# Patient Record
Sex: Male | Born: 1955
Health system: Southern US, Community
[De-identification: ages and names within clinical notes are randomized; demographics above are authoritative.]

## PROBLEM LIST (undated history)

## (undated) DIAGNOSIS — I8390 Asymptomatic varicose veins of unspecified lower extremity: Secondary | ICD-10-CM

## (undated) DIAGNOSIS — K52832 Lymphocytic colitis: Secondary | ICD-10-CM

## (undated) DIAGNOSIS — Z720 Tobacco use: Secondary | ICD-10-CM

## (undated) DIAGNOSIS — K5790 Diverticulosis of intestine, part unspecified, without perforation or abscess without bleeding: Secondary | ICD-10-CM

## (undated) DIAGNOSIS — K219 Gastro-esophageal reflux disease without esophagitis: Secondary | ICD-10-CM

## (undated) DIAGNOSIS — K760 Fatty (change of) liver, not elsewhere classified: Secondary | ICD-10-CM

## (undated) DIAGNOSIS — N400 Enlarged prostate without lower urinary tract symptoms: Secondary | ICD-10-CM

## (undated) DIAGNOSIS — Z8719 Personal history of other diseases of the digestive system: Secondary | ICD-10-CM

## (undated) DIAGNOSIS — M2140 Flat foot [pes planus] (acquired), unspecified foot: Secondary | ICD-10-CM

## (undated) DIAGNOSIS — I10 Essential (primary) hypertension: Secondary | ICD-10-CM

## (undated) DIAGNOSIS — M459 Ankylosing spondylitis of unspecified sites in spine: Secondary | ICD-10-CM

## (undated) DIAGNOSIS — K4090 Unilateral inguinal hernia, without obstruction or gangrene, not specified as recurrent: Secondary | ICD-10-CM

## (undated) HISTORY — DX: Ankylosing spondylitis of unspecified sites in spine: M45.9

## (undated) HISTORY — DX: Fatty (change of) liver, not elsewhere classified: K76.0

## (undated) HISTORY — DX: Tobacco use: Z72.0

## (undated) HISTORY — DX: Flat foot (pes planus) (acquired), unspecified foot: M21.40

## (undated) HISTORY — DX: Asymptomatic varicose veins of unspecified lower extremity: I83.90

## (undated) HISTORY — DX: Benign prostatic hyperplasia without lower urinary tract symptoms: N40.0

## (undated) HISTORY — DX: Diverticulosis of intestine, part unspecified, without perforation or abscess without bleeding: K57.90

## (undated) HISTORY — PX: OTHER SURGICAL HISTORY: SHX169

## (undated) HISTORY — DX: Lymphocytic colitis: K52.832

---

## 1992-10-02 HISTORY — PX: INGUINAL HERNIA REPAIR: SUR1180

## 2003-10-03 HISTORY — PX: OTHER SURGICAL HISTORY: SHX169

## 2006-10-02 LAB — HM COLONOSCOPY: HM Colonoscopy: NORMAL

## 2006-12-05 ENCOUNTER — Ambulatory Visit: Payer: Self-pay | Admitting: Family Medicine

## 2006-12-05 DIAGNOSIS — I868 Varicose veins of other specified sites: Secondary | ICD-10-CM | POA: Insufficient documentation

## 2006-12-05 DIAGNOSIS — M79609 Pain in unspecified limb: Secondary | ICD-10-CM | POA: Insufficient documentation

## 2006-12-05 DIAGNOSIS — M459 Ankylosing spondylitis of unspecified sites in spine: Secondary | ICD-10-CM | POA: Insufficient documentation

## 2006-12-05 DIAGNOSIS — I10 Essential (primary) hypertension: Secondary | ICD-10-CM | POA: Insufficient documentation

## 2006-12-13 ENCOUNTER — Ambulatory Visit: Payer: Self-pay | Admitting: Family Medicine

## 2006-12-13 ENCOUNTER — Telehealth (INDEPENDENT_AMBULATORY_CARE_PROVIDER_SITE_OTHER): Payer: Self-pay | Admitting: *Deleted

## 2006-12-13 DIAGNOSIS — K625 Hemorrhage of anus and rectum: Secondary | ICD-10-CM | POA: Insufficient documentation

## 2006-12-17 ENCOUNTER — Telehealth (INDEPENDENT_AMBULATORY_CARE_PROVIDER_SITE_OTHER): Payer: Self-pay | Admitting: *Deleted

## 2006-12-17 ENCOUNTER — Encounter: Payer: Self-pay | Admitting: Family Medicine

## 2007-01-14 ENCOUNTER — Encounter: Payer: Self-pay | Admitting: Family Medicine

## 2007-01-14 LAB — CONVERTED CEMR LAB
AST: 23 units/L (ref 0–37)
CO2: 24 meq/L (ref 19–32)
Chloride: 106 meq/L (ref 96–112)
Cholesterol: 214 mg/dL — ABNORMAL HIGH (ref 0–200)
Creatinine, Ser: 0.97 mg/dL (ref 0.40–1.50)
Glucose, Bld: 87 mg/dL (ref 70–99)
LDL Cholesterol: 143 mg/dL — ABNORMAL HIGH (ref 0–99)
PSA: 2.89 ng/mL (ref 0.10–4.00)
Total Bilirubin: 0.6 mg/dL (ref 0.3–1.2)
Total CHOL/HDL Ratio: 5.1
VLDL: 29 mg/dL (ref 0–40)

## 2007-01-16 ENCOUNTER — Ambulatory Visit: Payer: Self-pay | Admitting: Family Medicine

## 2007-01-16 DIAGNOSIS — E785 Hyperlipidemia, unspecified: Secondary | ICD-10-CM | POA: Insufficient documentation

## 2007-02-13 ENCOUNTER — Ambulatory Visit: Payer: Self-pay | Admitting: Family Medicine

## 2007-02-13 DIAGNOSIS — N400 Enlarged prostate without lower urinary tract symptoms: Secondary | ICD-10-CM | POA: Insufficient documentation

## 2007-02-19 ENCOUNTER — Telehealth (INDEPENDENT_AMBULATORY_CARE_PROVIDER_SITE_OTHER): Payer: Self-pay | Admitting: *Deleted

## 2007-02-20 ENCOUNTER — Telehealth: Payer: Self-pay | Admitting: Family Medicine

## 2007-02-27 ENCOUNTER — Encounter: Payer: Self-pay | Admitting: Family Medicine

## 2007-03-01 ENCOUNTER — Ambulatory Visit: Payer: Self-pay | Admitting: Internal Medicine

## 2007-03-05 ENCOUNTER — Telehealth: Payer: Self-pay | Admitting: Family Medicine

## 2007-03-11 ENCOUNTER — Ambulatory Visit: Payer: Self-pay | Admitting: Family Medicine

## 2007-03-11 DIAGNOSIS — M5030 Other cervical disc degeneration, unspecified cervical region: Secondary | ICD-10-CM | POA: Insufficient documentation

## 2007-03-14 ENCOUNTER — Encounter: Payer: Self-pay | Admitting: Family Medicine

## 2007-03-25 ENCOUNTER — Ambulatory Visit: Payer: Self-pay | Admitting: Internal Medicine

## 2007-03-25 ENCOUNTER — Encounter: Payer: Self-pay | Admitting: Family Medicine

## 2007-03-26 ENCOUNTER — Telehealth: Payer: Self-pay | Admitting: Family Medicine

## 2007-04-23 ENCOUNTER — Telehealth: Payer: Self-pay | Admitting: Family Medicine

## 2007-04-25 ENCOUNTER — Ambulatory Visit: Payer: Self-pay | Admitting: Family Medicine

## 2007-04-25 LAB — CONVERTED CEMR LAB
Albumin: 3.9 g/dL (ref 3.5–5.2)
Alkaline Phosphatase: 83 units/L (ref 39–117)
Cholesterol: 182 mg/dL (ref 0–200)
HDL: 37 mg/dL — ABNORMAL LOW (ref >39)
Indirect Bilirubin: 0.3 mg/dL (ref 0.0–0.9)
LDL Cholesterol: 122 mg/dL — ABNORMAL HIGH (ref 0–99)
Total CHOL/HDL Ratio: 4.9

## 2007-04-26 ENCOUNTER — Encounter: Payer: Self-pay | Admitting: Family Medicine

## 2007-05-07 ENCOUNTER — Ambulatory Visit: Payer: Self-pay | Admitting: Family Medicine

## 2007-05-07 ENCOUNTER — Encounter: Admission: RE | Admit: 2007-05-07 | Discharge: 2007-05-07 | Payer: Self-pay | Admitting: Family Medicine

## 2007-05-07 DIAGNOSIS — D485 Neoplasm of uncertain behavior of skin: Secondary | ICD-10-CM | POA: Insufficient documentation

## 2007-05-08 ENCOUNTER — Telehealth (INDEPENDENT_AMBULATORY_CARE_PROVIDER_SITE_OTHER): Payer: Self-pay | Admitting: *Deleted

## 2007-05-09 ENCOUNTER — Ambulatory Visit: Admission: RE | Admit: 2007-05-09 | Discharge: 2007-05-09 | Payer: Self-pay | Admitting: Family Medicine

## 2007-05-09 ENCOUNTER — Telehealth: Payer: Self-pay | Admitting: Family Medicine

## 2007-05-09 ENCOUNTER — Ambulatory Visit: Payer: Self-pay | Admitting: Vascular Surgery

## 2007-05-30 ENCOUNTER — Ambulatory Visit (HOSPITAL_COMMUNITY)
Admission: RE | Admit: 2007-05-30 | Discharge: 2007-05-30 | Payer: Self-pay | Admitting: Physical Medicine and Rehabilitation

## 2007-06-05 ENCOUNTER — Encounter: Payer: Self-pay | Admitting: Family Medicine

## 2007-06-17 ENCOUNTER — Encounter
Admission: RE | Admit: 2007-06-17 | Discharge: 2007-06-17 | Payer: Self-pay | Admitting: Physical Medicine and Rehabilitation

## 2007-07-04 ENCOUNTER — Encounter: Payer: Self-pay | Admitting: Family Medicine

## 2007-07-05 ENCOUNTER — Encounter: Payer: Self-pay | Admitting: Family Medicine

## 2007-07-05 LAB — CONVERTED CEMR LAB: AST: 18 units/L (ref 0–37)

## 2007-07-08 ENCOUNTER — Telehealth (INDEPENDENT_AMBULATORY_CARE_PROVIDER_SITE_OTHER): Payer: Self-pay | Admitting: *Deleted

## 2007-07-08 ENCOUNTER — Ambulatory Visit: Payer: Self-pay | Admitting: Family Medicine

## 2007-07-08 DIAGNOSIS — R945 Abnormal results of liver function studies: Secondary | ICD-10-CM | POA: Insufficient documentation

## 2007-07-08 DIAGNOSIS — M5137 Other intervertebral disc degeneration, lumbosacral region: Secondary | ICD-10-CM | POA: Insufficient documentation

## 2007-11-01 ENCOUNTER — Encounter
Admission: RE | Admit: 2007-11-01 | Discharge: 2007-11-01 | Payer: Self-pay | Admitting: Physical Medicine and Rehabilitation

## 2007-12-01 HISTORY — PX: BACK SURGERY: SHX140

## 2007-12-05 ENCOUNTER — Inpatient Hospital Stay (HOSPITAL_COMMUNITY): Admission: RE | Admit: 2007-12-05 | Discharge: 2007-12-06 | Payer: Self-pay | Admitting: Neurosurgery

## 2008-11-03 ENCOUNTER — Ambulatory Visit: Payer: Self-pay | Admitting: Family Medicine

## 2008-11-03 LAB — CONVERTED CEMR LAB
AST: 19 units/L (ref 0–37)
Albumin: 4.3 g/dL (ref 3.5–5.2)
Alkaline Phosphatase: 71 units/L (ref 39–117)
CO2: 25 meq/L (ref 19–32)
Chloride: 105 meq/L (ref 96–112)
Cholesterol: 167 mg/dL (ref 0–200)
Glucose, Bld: 99 mg/dL (ref 70–99)
Sodium: 143 meq/L (ref 135–145)
TSH: 1.627 microintl units/mL (ref 0.350–4.50)
Total Protein: 6.8 g/dL (ref 6.0–8.3)
Triglycerides: 80 mg/dL (ref <150)
VLDL: 16 mg/dL (ref 0–40)

## 2008-11-04 ENCOUNTER — Encounter: Payer: Self-pay | Admitting: Family Medicine

## 2009-05-20 ENCOUNTER — Telehealth (INDEPENDENT_AMBULATORY_CARE_PROVIDER_SITE_OTHER): Payer: Self-pay | Admitting: *Deleted

## 2009-06-03 ENCOUNTER — Telehealth (INDEPENDENT_AMBULATORY_CARE_PROVIDER_SITE_OTHER): Payer: Self-pay | Admitting: *Deleted

## 2009-06-15 ENCOUNTER — Telehealth (INDEPENDENT_AMBULATORY_CARE_PROVIDER_SITE_OTHER): Payer: Self-pay | Admitting: *Deleted

## 2009-08-13 ENCOUNTER — Ambulatory Visit: Payer: Self-pay | Admitting: Family Medicine

## 2009-08-13 DIAGNOSIS — K5730 Diverticulosis of large intestine without perforation or abscess without bleeding: Secondary | ICD-10-CM | POA: Insufficient documentation

## 2009-10-05 ENCOUNTER — Ambulatory Visit: Payer: Self-pay | Admitting: Family Medicine

## 2009-10-05 DIAGNOSIS — K5909 Other constipation: Secondary | ICD-10-CM | POA: Insufficient documentation

## 2009-10-07 ENCOUNTER — Telehealth: Payer: Self-pay | Admitting: Family Medicine

## 2009-10-07 ENCOUNTER — Encounter: Admission: RE | Admit: 2009-10-07 | Discharge: 2009-10-07 | Payer: Self-pay | Admitting: Family Medicine

## 2009-10-07 DIAGNOSIS — R1084 Generalized abdominal pain: Secondary | ICD-10-CM | POA: Insufficient documentation

## 2009-10-08 ENCOUNTER — Ambulatory Visit: Payer: Self-pay | Admitting: Family Medicine

## 2009-10-08 DIAGNOSIS — K760 Fatty (change of) liver, not elsewhere classified: Secondary | ICD-10-CM | POA: Insufficient documentation

## 2010-10-02 HISTORY — PX: BACK SURGERY: SHX140

## 2010-10-25 ENCOUNTER — Ambulatory Visit
Admission: RE | Admit: 2010-10-25 | Discharge: 2010-10-25 | Payer: Self-pay | Source: Home / Self Care | Attending: Family Medicine | Admitting: Family Medicine

## 2010-10-25 DIAGNOSIS — J069 Acute upper respiratory infection, unspecified: Secondary | ICD-10-CM | POA: Insufficient documentation

## 2010-10-25 DIAGNOSIS — K5900 Constipation, unspecified: Secondary | ICD-10-CM | POA: Insufficient documentation

## 2010-11-01 NOTE — Progress Notes (Signed)
Summary: Still constipated  Phone Note Call from Patient   Caller: Patient [phone] Summary of Call: Pt called this AM stating that he does not feel any better. States he is taking all the recommended meds but still constipated. Please advise.  Initial call taken by: Payton Spark CMA,  October 07, 2009 8:07 AM  Follow-up for Phone Call        lets go ahead and get a flat and upright abdominal xray to r/o obstuction and add RX Lactulose for constipation. Follow-up by: Seymour Bars DO,  October 07, 2009 8:21 AM    New/Updated Medications: LACTULOSE 10 GM/15ML SOLN (LACTULOSE) 30 ml by mouth two times a day x 3-5 days for constipation Prescriptions: LACTULOSE 10 GM/15ML SOLN (LACTULOSE) 30 ml by mouth two times a day x 3-5 days for constipation  #300 ml x 0   Entered and Authorized by:   Seymour Bars DO   Signed by:   Seymour Bars DO on 10/07/2009   Method used:   [facility]  [address] [phone][facility]       [address] [place], [place]  [address]       Ph: [phone] or [phone]       Fax: [phone]   RxID:   [id number]   Appended Document: Still constipated Pt aware

## 2010-11-01 NOTE — Assessment & Plan Note (Signed)
Summary: f/u constipation   Vital Signs:  Patient profile:   55 year old male Height:      73 inches Weight:      202 pounds BMI:     26.75 O2 Sat:      99 % on Room air Pulse rate:   87 / minute BP sitting:   127 / 87  (left arm) Cuff size:   large  Vitals Entered By: Payton Spark CMA (October 08, 2009 9:51 AM)  O2 Flow:  Room air CC: F/U US. Pain is gone. Had 3 BMs this AM. Feeling much better.   Primary Care Provider:  Seymour Bars D.O.  CC:  F/U US. Pain is gone. Had 3 BMs this AM. Feeling much better.Marland Kitchen  History of Present Illness: 55 yo WM presents for f/u constipation.  He used 2 enemas, benefiber and miralax with stool softeners for 3 days.  He did not have  a stool until this morning.  He had 3 large stools this morning and his abdominal pain and distension has improved.  the constipation was felt to be related to anti histamines.  Had a colonoscopy in 08.  He is not having blood in the stool.  His CT abd/ pelvis was normal other than fatty liver. He plans to start working out regularly.  He consumes 4-6 ETOH drinks/ wk and does not take any tylenol.     Allergies (verified): No Known Drug Allergies  Past History:  Past Medical History: ankylosing spondylitis varicose veins pes planus tobacco abuse BPH colonoscopy 08, normal diverticulosis fatty liver dz  Review of Systems      See HPI  Physical Exam  General:  alert, well-developed, well-nourished, and well-hydrated.   Head:  normocephalic and atraumatic.   Eyes:  sclera non icteric Mouth:  pharynx pink and moist.   Neck:  large SK at the base of the L side of neck Lungs:  Normal respiratory effort, chest expands symmetrically. Lungs are clear to auscultation, no crackles or wheezes. Heart:  Normal rate and regular rhythm. S1 and S2 normal without gallop, murmur, click, rub or other extra sounds. Abdomen:  Bowel sounds positive,abdomen soft and non-tender without masses, organomegaly.  softer and  now nontender. Skin:  color normal.   Psych:  good eye contact, not anxious appearing, and not depressed appearing.     Impression & Recommendations:  Problem # 1:  CONSTIPATION, DRUG INDUCED (ICD-564.09) Secondary to cold medications/ lack of water. Symptomatically much improved after bowel regimen.  CT abd/ pelvis other than fatty liver dz is normal.  Colonoscopy from 08 reviewed, normal other than diverticulosis. We discussed use of daily high fiber diet (with benefiber), increased water intake and stool softeners as needed.   His updated medication list for this problem includes:    Lactulose 10 Gm/108ml Soln (Lactulose) .Marland KitchenMarland KitchenMarland KitchenMarland Kitchen 30 ml by mouth two times a day x 3-5 days for constipation  Problem # 2:  FATTY LIVER DISEASE (ICD-571.8) Assessment: New Seen incidentally on CT of the abdomen and pelvis. We discussed treatment with healthy diet, exercise, wt loss and limiting ETOH and Tyelnol containing products.  Complete Medication List: 1)  Aspir-low 81 Mg Tbec (Aspirin) .... Take 1 tablet by mouth two times a day 2)  Caltrate 600+d 600-400 Mg-unit Tabs (Calcium carbonate-vitamin d) .... Take 1 tablet by mouth once a day 3)  Multivitamins Tabs (Multiple vitamin) .... 1/2 by mouth two times a day 4)  Vitamin E 1000 Unit Caps (Vitamin e) .Marland KitchenMarland KitchenMarland Kitchen  Take 1 tablet by mouth once a day 5)  Potassium 99 Mg Tabs (Potassium) .... Take two tabs by mouth daily 6)  Pravastatin Sodium 40 Mg Tabs (Pravastatin sodium) .Marland Kitchen.. 1 tab by mouth qhs 7)  Flomax 0.4 Mg Cp24 (Tamsulosin hcl) .Marland Kitchen.. 1 tab by mouth qpm 8)  Fish Oil 1000 Mg Caps (Omega-3 fatty acids) .... 2 capsules by mouth bid 9)  B-12 1000 Mcg Cr-tabs (Cyanocobalamin) .... Take 1 tablet by mouth once a day 10)  Chantix Starting Month Pak 0.5 Mg X 11 & 1 Mg X 42 Tabs (Varenicline tartrate) .... Take as directed 11)  Lactulose 10 Gm/21ml Soln (Lactulose) .... 30 ml by mouth two times a day x 3-5 days for constipation  Other Orders: Admin 1st Vaccine  (78295) Flu Vaccine 60yrs + (62130)  Patient Instructions: 1)  Work on high fiber diet-- you can stay on Benefiber daily with V8 Fusion.  Drink 6 glasses of water each day.  Use Stool softener as needed. 2)  Return in 2 months for a CPE / fasting labs. Flu Vaccine Consent Questions     Do you have a history of severe allergic reactions to this vaccine? no    Any prior history of allergic reactions to egg and/or gelatin? no    Do you have a sensitivity to the preservative Thimersol? no    Do you have a past history of Guillan-Barre Syndrome? no    Do you currently have an acute febrile illness? no    Have you ever had a severe reaction to latex? no    Vaccine information given and explained to patient? yes    Are you currently pregnant? no    Lot Number:[id number]   Exp Date:03/31/2010   Site Given  Left Deltoid IMUse Stool softener as needed. 2)  Return in 2 months for a CPE / fasting labs.     Marland Kitchenlbflu

## 2010-11-01 NOTE — Letter (Signed)
Summary: Out of Work  [facility]  [address], [address]   [place], [place] [address]   Phone: [phone]  Fax: [phone]    October 05, 2009   Employee:  [employer] BREIGHNER    To Whom It May Concern:   For Medical reasons, please excuse the above named employee from work for the following dates:  Start:   Jan 4th  End:   Jan 5th  If you need additional information, please feel free to contact our office.         Sincerely,    Seymour Bars DO

## 2010-11-01 NOTE — Assessment & Plan Note (Signed)
Summary: constipation   Vital Signs:  Patient profile:   55 year old male Height:      73 inches Weight:      203 pounds BMI:     26.88 O2 Sat:      98 % on Room air Temp:     97.9 degrees F oral Pulse rate:   85 / minute BP sitting:   131 / 87  (left arm) Cuff size:   large  Vitals Entered By: Payton Spark CMA (October 05, 2009 9:04 AM)  O2 Flow:  Room air CC: Constipation, stomach cramps, indigestion and weak x 1 day.    Primary Care Provider:  Seymour Bars D.O.  CC:  Constipation, stomach cramps, and indigestion and weak x 1 day. Marland Kitchen  History of Present Illness: Raymond Mitchell presents for stomach cramps that started yesterday.  He started to get a cold last wk, taking OTC meds.  Had constipation last wk after taking cold meds.  He is taking a stool softener.  Felt weak yesterday.  Had gas pain last night.  Took Mag citrate last night and went a little at 5:30 Am today.  He has had to strain.  He has tried to drink more water.  He had a colonoscpy at age 59 that was normal.  No blood in the stool.  Some waves of nausea, no vomitting.    Current Medications (verified): 1)  Aspir-Low 81 Mg Tbec (Aspirin) .... Take 1 Tablet By Mouth Two Times A Day 2)  Caltrate 600+d 600-400 Mg-Unit Tabs (Calcium Carbonate-Vitamin D) .... Take 1 Tablet By Mouth Once A Day 3)  Multivitamins  Tabs (Multiple Vitamin) .... 1/2 By Mouth Two Times A Day 4)  Vitamin E 1000 Unit Caps (Vitamin E) .... Take 1 Tablet By Mouth Once A Day 5)  Potassium 99 Mg Tabs (Potassium) .... Take Two Tabs By Mouth Daily 6)  Pravastatin Sodium 40 Mg Tabs (Pravastatin Sodium) .Marland Kitchen.. 1 Tab By Mouth Qhs 7)  Flomax 0.4 Mg Cp24 (Tamsulosin Hcl) .Marland Kitchen.. 1 Tab By Mouth Qpm 8)  Fish Oil 1000 Mg  Caps (Omega-3 Fatty Acids) .... 2 Capsules By Mouth Bid 9)  B-12 1000 Mcg Cr-Tabs (Cyanocobalamin) .... Take 1 Tablet By Mouth Once A Day  Allergies (verified): No Known Drug Allergies  Past History:  Past Medical History: ankylosing  spondylitis varicose veins pes planus tobacco abuse BPH colonoscopy 08, normal  Social History: Reviewed history from 12/05/2006 and no changes required. 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   Head:  normocephalic and atraumatic.   Mouth:  good dentition and pharynx pink and moist.   Neck:  no masses.   Lungs:  Normal respiratory effort, chest expands symmetrically. Lungs are clear to auscultation, no crackles or wheezes. Heart:  Normal rate and regular rhythm. S1 and S2 normal without gallop, murmur, click, rub or other extra sounds. Abdomen:  soft, ND, soft with hyperactive BS, tympanic to percussion.  Periumbilical guarding.   Extremities:  no LE edema Skin:  color normal.   Cervical Nodes:  No lymphadenopathy noted Psych:  good eye contact, not anxious appearing, and not depressed appearing.  Impression & Recommendations:  Problem # 1:  CONSTIPATION, DRUG INDUCED (ICD-564.09) Cold medication induced constipation x 1 wk and benign abdominal exam findings. Treat with Miralax 2 x daily x 3 days then once daily x 3 days. Stool softener 2 x a day. Fleets enema x 2 today. Benefiber samples given to mix with 12 oz of prune juice daily.  64 oz water daily. Out of work today due to severe cramps. If abd pain/ constipation not improved in 48 hrs, will have him call me for imaging.  Complete Medication List: 1)  Aspir-low 81 Mg Tbec (Aspirin) .... Take 1 tablet by mouth two times a day 2)  Caltrate 600+d 600-400 Mg-unit Tabs (Calcium carbonate-vitamin d) .... Take 1 tablet by mouth once a day 3)  Multivitamins Tabs (Multiple vitamin) .... 1/2 by mouth  two times a day 4)  Vitamin E 1000 Unit Caps (Vitamin e) .... Take 1 tablet by mouth once a day 5)  Potassium 99 Mg Tabs (Potassium) .... Take two tabs by mouth daily 6)  Pravastatin Sodium 40 Mg Tabs (Pravastatin sodium) .Marland Kitchen.. 1 tab by mouth qhs 7)  Flomax 0.4 Mg Cp24 (Tamsulosin hcl) .Marland Kitchen.. 1 tab by mouth qpm 8)  Fish Oil 1000 Mg Caps (Omega-3 fatty acids) .... 2 capsules by mouth bid 9)  B-12 1000 Mcg Cr-tabs (Cyanocobalamin) .... Take 1 tablet by mouth once a day 10)  Chantix Starting Month Pak 0.5 Mg X 11 & 1 Mg X 42 Tabs (Varenicline tartrate) .... Take as directed  Patient Instructions: 1)  Take OTC Miralax 2 x a day for the next 3 days then once daily for 3 more days.  Drink 64 oz/ water daily.  Try adding Prune juice or V8 fusion daily.  Mix with one packet of benefiber daily. 2)  Avoid cold medications. 3)  Take you stool softener 2 x a day. 4)  Use one fleets enema now, repeat this evening if no results. 5)  If constipation/ abdominal pain not improved in 2 days, please call. Prescriptions: CHANTIX STARTING MONTH PAK 0.5 MG X 11 & 1 MG X 42 TABS (VARENICLINE TARTRATE) take as directed  #1 pack x 0   Entered and Authorized by:   Seymour Bars DO   Signed by:   Seymour Bars DO on 10/05/2009   Method used:   [facility]        [facility]  [address] [phone][facility] (retail[facility]       [address] [place], [place]  [address]       Ph: [phone] or [phone]       Fax: [phone]   RxID:   [id number]

## 2010-11-03 NOTE — Assessment & Plan Note (Signed)
Summary: URI   Vital Signs:  Patient profile:   55 year old male Height:      73 inches Weight:      193 pounds BMI:     25.56 O2 Sat:      97 % on Room air Temp:     98.4 degrees F oral Pulse rate:   68 / minute BP sitting:   124 / 84  (left arm) Cuff size:   large  Vitals Entered By: Payton Spark CMA (October 25, 2010 11:32 AM)  O2 Flow:  Room air CC: Congestion, drainage, ST, and cough x 3 days.    Primary Care Provider:  Seymour Bars D.O.  CC:  Congestion, drainage, ST, and and cough x 3 days. Marland Kitchen  History of Present Illness: 55 yo WM presents for sore throat, runny nose and congestion x 3 days with a slight cough.  He is taking some [facility] cold medicine- Coricidan which is not helping much.  He is a smoker.  Denies chest pain or SOB.  Clear / yellow rhinorrea.  No fevers.  Some chills.  No HAs or GI upset.  He did not get a flu shot this year.  Current Medications (verified): 1)  Aspir-Low 81 Mg Tbec (Aspirin) .... Take 1 Tablet By Mouth Two Times A Day 2)  Caltrate 600+d 600-400 Mg-Unit Tabs (Calcium Carbonate-Vitamin D) .... Take 1 Tablet By Mouth Once A Day 3)  Multivitamins  Tabs (Multiple Vitamin) .... 1/2 By Mouth Two Times A Day 4)  Vitamin E 1000 Unit Caps (Vitamin E) .... Take 1 Tablet By Mouth Once A Day 5)  Potassium 99 Mg Tabs (Potassium) .... Take Two Tabs By Mouth Daily 6)  Pravastatin Sodium 40 Mg Tabs (Pravastatin Sodium) .Marland Kitchen.. 1 Tab By Mouth Qhs 7)  Flomax 0.4 Mg Caps (Tamsulosin Hcl) .... Take 1 Cap By Mouth Once Daily 8)  Fish Oil 1000 Mg  Caps (Omega-3 Fatty Acids) .... 2 Capsules By Mouth Bid 9)  B-12 1000 Mcg Cr-Tabs (Cyanocobalamin) .... Take 1 Tablet By Mouth Once A Day  Allergies (verified): No Known Drug Allergies  Past History:  Past Medical History: Reviewed history from 10/08/2009 and no changes required. ankylosing spondylitis varicose veins pes planus tobacco abuse BPH colonoscopy 08, normal diverticulosis fatty liver dz  Past  Surgical History: Reviewed history from 12/05/2006 and no changes required. inguinal hernia 1994 RTC surgery 2005 C spine ESI by Dr Penelope Galas  Social History: Reviewed history from 12/05/2006 and no changes required. 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 ENT:  Complains of nasal congestion, postnasal drainage, and sore throat; denies sinus pressure. CV:  Denies chest pain or discomfort and shortness of breath with exertion. Resp:  Complains of cough; denies chest discomfort, chest pain with inspiration, shortness of breath, sputum productive, and wheezing. GI:  Complains of constipation; denies abdominal pain, diarrhea, nausea, and vomiting.  Physical Exam  General:  alert, well-developed, well-nourished, and well-hydrated.   Head:  normocephalic and atraumatic.  sinuses NTTP Eyes:  conjunctiva clear Nose:  nasal congestion with clear rhinorrhea present Mouth:  pharynx pink and moist.  o/p slightly injected Neck:  shoddy anterior cervical chain LA  present Lungs:  Normal respiratory effort, chest expands symmetrically. Lungs are clear to auscultation, no crackles or wheezes. dry cough Heart:  Normal rate and regular rhythm. S1 and S2 normal without gallop, murmur, click, rub or other extra sounds. Abdomen:  slightly distended with hard stool palapable in the prox R colon Skin:  warm and dry; multiple lg SKs on back   Impression & Recommendations:  Problem # 1:  VIRAL URI (ICD-465.9) Supportive care for viral URI, day 3.  Avoid smoking.  Change OTC medicine to Mucinex DM in the AM and Hycodan at night.  Call if any problems.   His updated medication list for  this problem includes:    Aspir-low 81 Mg Tbec (Aspirin) .Marland Kitchen... Take 1 tablet by mouth two times a day    Hydrocodone-homatropine 5-1.5 Mg/46ml Syrp (Hydrocodone-homatropine) .Marland KitchenMarland KitchenMarland KitchenMarland Kitchen 5 ml by mouth at bedtime as needed cough  Instructed on symptomatic treatment. Call if symptoms persist or worsen.   Problem # 2:  CONSTIPATION (ICD-564.00) Chronic.  Can use OTC Miralax + high fiber diet. The following medications were removed from the medication list:    Lactulose 10 Gm/82ml Soln (Lactulose) .Marland KitchenMarland KitchenMarland KitchenMarland Kitchen 30 ml by mouth two times a day x 3-5 days for constipation  Complete Medication List: 1)  Aspir-low 81 Mg Tbec (Aspirin) .... Take 1 tablet by mouth two times a day 2)  Caltrate 600+d 600-400 Mg-unit Tabs (Calcium carbonate-vitamin d) .... Take 1 tablet by mouth once a day 3)  Multivitamins Tabs (Multiple vitamin) .... 1/2 by mouth two times a day 4)  Vitamin E 1000 Unit Caps (Vitamin e) .... Take 1 tablet by mouth once a day 5)  Potassium 99 Mg Tabs (Potassium) .... Take two tabs by mouth daily 6)  Pravastatin Sodium 40 Mg Tabs (Pravastatin sodium) .Marland Kitchen.. 1 tab by mouth qhs 7)  Flomax 0.4 Mg Caps (Tamsulosin hcl) .... Take 1 cap by mouth once daily 8)  Fish Oil 1000 Mg Caps (Omega-3 fatty acids) .... 2 capsules by mouth bid 9)  B-12 1000 Mcg Cr-tabs (Cyanocobalamin) .... Take 1 tablet by mouth once a day 10)  Hydrocodone-homatropine 5-1.5 Mg/63ml Syrp (Hydrocodone-homatropine) .... 5 ml by mouth at bedtime as needed cough  Patient Instructions: 1)  For cold symptoms:  Take Mucinex DM in the morning and RX cough syrup at night.  Use Ibuprofen as needed aches/ pains. 2)  REst, clear fluids and avoid smoking. 3)  Use OTC Miralax either everyday or every other day for chronic constipation. 4)  schedule PHYSICAL with fasting labs in 3-4 wks. Prescriptions: HYDROCODONE-HOMATROPINE 5-1.5 MG/5ML SYRP (HYDROCODONE-HOMATROPINE) 5 ml by mouth at bedtime as needed cough  #100 ml x 0   Entered and Authorized by:    Seymour Bars DO   Signed by:   Seymour Bars DO on 10/25/2010   Method used:   Printed then faxed to ...       [facility]  [address] [phone]* (retail)       [address] [place], [place]  [address]       Ph: [phone] or [phone]       Fax: [phone]   RxID:   [id number] FLOMAX 0.4 MG CAPS (TAMSULOSIN HCL) Take 1 cap by mouth once daily  #30 Capsule x 1   Entered and Authorized by:   Seymour Bars DO   Signed by:   Seymour Bars DO on 10/25/2010   Method used:   [facility]  [facility]  [address] [phone]* (retail)       [address] [place], [place]  [address]       Ph: [phone] or [phone]       Fax: [phone]   RxID:   [id number]    Orders Added: 1)  Est. Patient Level III [59563]

## 2010-11-25 ENCOUNTER — Telehealth: Payer: Self-pay | Admitting: Family Medicine

## 2010-11-28 ENCOUNTER — Encounter: Payer: Self-pay | Admitting: Family Medicine

## 2010-11-29 ENCOUNTER — Encounter: Payer: Self-pay | Admitting: Family Medicine

## 2010-11-29 ENCOUNTER — Encounter (INDEPENDENT_AMBULATORY_CARE_PROVIDER_SITE_OTHER): Payer: 59 | Admitting: Family Medicine

## 2010-11-29 DIAGNOSIS — Z0000 Encounter for general adult medical examination without abnormal findings: Secondary | ICD-10-CM

## 2010-11-29 DIAGNOSIS — F17200 Nicotine dependence, unspecified, uncomplicated: Secondary | ICD-10-CM | POA: Insufficient documentation

## 2010-11-29 LAB — CONVERTED CEMR LAB
AST: 18 units/L (ref 0–37)
Albumin: 4.2 g/dL (ref 3.5–5.2)
BUN: 18 mg/dL (ref 6–23)
Chloride: 104 meq/L (ref 96–112)
Creatinine, Ser: 1.06 mg/dL (ref 0.40–1.50)
Glucose, Bld: 100 mg/dL — ABNORMAL HIGH (ref 70–99)
HDL: 44 mg/dL (ref >39)
LDL Cholesterol: 94 mg/dL (ref 0–99)
PSA: 0.61 ng/mL (ref <=4.00)
Potassium: 4.2 meq/L (ref 3.5–5.3)
Sodium: 142 meq/L (ref 135–145)
Total Bilirubin: 0.6 mg/dL (ref 0.3–1.2)
Total CHOL/HDL Ratio: 3.5
VLDL: 17 mg/dL (ref 0–40)

## 2010-11-29 NOTE — Progress Notes (Signed)
Summary: FAX lab orders to lab  Phone Note Call from Patient   Caller: Patient Summary of Call: Patient has a physical scheduled for Tues. 11/29/10 and he wants to go Monday 2/27 to the lab downstairs to go ahead and get his labs completed. WIll you please FAX lab orders downstairs to lab so he can get this completed before his appt? Thanks, Victorino Dike  Initial call taken by: Michaelle Copas,  November 25, 2010 9:22 AM  Follow-up for Phone Call        lab order printed.  fax downstairs.  can do MON. Follow-up by: Seymour Bars DO,  November 25, 2010 9:45 AM  New Problems: OTH&UNSPEC ENDOCRN NUTRIT METAB&IMMUNITY D/O (ICD-V77.99) SPECIAL SCREENING MALIGNANT NEOPLASM OF PROSTATE (ICD-V76.44)   New Problems: OTH&UNSPEC ENDOCRN NUTRIT METAB&IMMUNITY D/O (ICD-V77.99) SPECIAL SCREENING MALIGNANT NEOPLASM OF PROSTATE (ICD-V76.44)  Appended Document: FAX lab orders to lab Patient aware to fast and the he can do the labs on Mon.

## 2010-12-02 ENCOUNTER — Telehealth: Payer: Self-pay | Admitting: Family Medicine

## 2010-12-02 ENCOUNTER — Encounter: Payer: Self-pay | Admitting: Family Medicine

## 2010-12-02 ENCOUNTER — Ambulatory Visit (INDEPENDENT_AMBULATORY_CARE_PROVIDER_SITE_OTHER): Payer: 59 | Admitting: Family Medicine

## 2010-12-02 ENCOUNTER — Other Ambulatory Visit: Payer: Self-pay | Admitting: Family Medicine

## 2010-12-02 ENCOUNTER — Ambulatory Visit
Admission: RE | Admit: 2010-12-02 | Discharge: 2010-12-02 | Disposition: A | Discharge status: Home / Self Care | Payer: 59 | Source: Ambulatory Visit | Attending: Family Medicine | Admitting: Family Medicine

## 2010-12-02 DIAGNOSIS — R42 Dizziness and giddiness: Secondary | ICD-10-CM | POA: Insufficient documentation

## 2010-12-02 DIAGNOSIS — K5900 Constipation, unspecified: Secondary | ICD-10-CM

## 2010-12-02 LAB — CONVERTED CEMR LAB
Bilirubin Urine: NEGATIVE
Blood in Urine, dipstick: NEGATIVE
Specific Gravity, Urine: 1.01
pH: 7

## 2010-12-08 NOTE — Progress Notes (Signed)
Summary: [facility]-lightdeaded/schedule appt  Phone Note Call from Patient Call back at Home Phone [phone]   Caller: Patient Call For: Seymour Bars DO Reason for Call: Acute Illness Summary of Call: message left by pt at 10:09am.  Pt is feeling lightheaded and dizzy.  This is not made better by eating.  Pt would like an appt.  Message left on number given to [facility] [phone].  Initial call taken by: Francee Piccolo CMA Duncan Dull),  December 02, 2010 11:40 AM  Follow-up for Phone Call        RETURNED CALL, appt acheudled. Follow-up by: Francee Piccolo CMA Duncan Dull),  December 02, 2010 11:51 AM

## 2010-12-08 NOTE — Assessment & Plan Note (Signed)
Summary: CPE   Vital Signs:  Patient profile:   55 year old male Height:      73 inches Weight:      194 pounds BMI:     25.69 O2 Sat:      100 % on Room air Pulse rate:   77 / minute BP sitting:   134 / 83  (left arm) Cuff size:   large  Vitals Entered By: Payton Spark CMA (November 29, 2010 9:12 AM)  O2 Flow:  Room air CC: CPE   Primary Care Provider:  Seymour Bars D.O.  CC:  CPE.  History of Present Illness: 55 yo WM presents for CPE.  He is a smoker with high cholesterol.  Denies CP or DOE.    He had a colonoscopy in 2008 that was normal.  He continues to smoke 1.5 ppd.  He did labs yesterday.  Fasitng sugar was right at 100.  He occasionally feels lightheaded.  Denies problems voiding on Flomax with hx of BPH.  He eats regular meals and drinks lots of water.    Denies fam hx of premature heart dz, prostate or colon cancer.        Current Medications (verified): 1)  Aspir-Low 81 Mg Tbec (Aspirin) .... Take 1 Tablet By Mouth Two Times A Day 2)  Caltrate 600+d 600-400 Mg-Unit Tabs (Calcium Carbonate-Vitamin D) .... Take 1 Tablet By Mouth Once A Day 3)  Multivitamins  Tabs (Multiple Vitamin) .... 1/2 By Mouth Two Times A Day 4)  Vitamin E 1000 Unit Caps (Vitamin E) .... Take 1 Tablet By Mouth Once A Day 5)  Potassium 99 Mg Tabs (Potassium) .... Take Two Tabs By Mouth Daily 6)  Pravastatin Sodium 40 Mg Tabs (Pravastatin Sodium) .Marland Kitchen.. 1 Tab By Mouth Qhs 7)  Flomax 0.4 Mg Caps (Tamsulosin Hcl) .... Take 1 Cap By Mouth Once Daily 8)  Fish Oil 1000 Mg  Caps (Omega-3 Fatty Acids) .... 2 Capsules By Mouth Bid 9)  B-12 1000 Mcg Cr-Tabs (Cyanocobalamin) .... Take 1 Tablet By Mouth Once A Day  Allergies (verified): No Known Drug Allergies  Past History:  Past Medical History: Reviewed history from 10/08/2009 and no changes required. ankylosing spondylitis varicose veins pes planus tobacco abuse BPH colonoscopy 08, normal diverticulosis fatty liver dz  Past  Surgical History: Reviewed history from 12/05/2006 and no changes required. inguinal hernia 1994 RTC surgery 2005 C spine ESI by Dr Penelope Galas  Family History: Reviewed history from 12/05/2006 and no changes required. brother brain tumor, CVA mother high chol, DM, HTN father high chol,HTN, ank spondy brothers ank spondy  Social History: Reviewed history from 12/05/2006 and no changes required. 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 to [place] wtih 2 stepkids Current Smoker 1 ppd x 25 yr, used to smoke more Alcohol use-yes 3 drinks per day Drug use-no Regular exercise-yes walks and does Bowflex  Review of Systems  The patient denies anorexia, fever, weight loss, weight gain, vision loss, decreased hearing, hoarseness, chest pain, syncope, dyspnea on exertion, peripheral edema, prolonged cough, headaches, hemoptysis, abdominal pain, melena, hematochezia, severe indigestion/heartburn, hematuria, incontinence, genital sores, muscle weakness, suspicious skin lesions, transient blindness, difficulty walking, depression, unusual weight change, abnormal bleeding, enlarged lymph nodes, angioedema, breast masses, and testicular masses.    Physical Exam  General:  alert, well-developed, well-nourished, and well-hydrated.   Head:  normocephalic and atraumatic.   Eyes:  PERRLA, wears glasses Ears:  EACs patent; TMs translucent and gray  with good cone of light and bony landmarks.  Nose:  no nasal discharge.   Mouth:  pharynx pink and moist, no erythema, and fair dentition.   Neck:  no masses.  no audible carotid  bruits Lungs:  Normal respiratory effort, chest expands symmetrically. Lungs are clear to auscultation, no crackles or wheezes. Heart:  Normal rate and regular rhythm. S1 and S2 normal without gallop, murmur, click, rub or other extra sounds. Abdomen:  soft, non-tender, normal bowel sounds, no distention, no masses, no guarding, no hepatomegaly,  and no splenomegaly.  no AA bruits Rectal:  No external abnormalities noted. Normal sphincter tone. No rectal masses or tenderness.  ext hemorrhoidal tags, hemoccult neg Prostate:  2+ enlarged prostate, no nodules or masses Pulses:  2+ radial and pedal pulses Extremities:  no LE edema; bilat LE varicose veins Neurologic:  gait normal.   Cervical Nodes:  No lymphadenopathy noted Psych:  good eye contact, not anxious appearing, and not depressed appearing.     Impression & Recommendations:  Problem # 1:  HEALTH MAINTENANCE EXAM (ICD-V70.0) Keeping healthy checklist for men reviewed. BP at goal.  bMI at goal Counseled on smoking cessation. Colonoscopy due 2018.  Immunizations UTD. PSA normal yesterday, DRE today. RF meds. Labs updated yesterday. RTC in 6 mos.  Complete Medication List: 1)  Aspir-low 81 Mg Tbec (Aspirin) .... Take 1 tablet by mouth two times a day 2)  Caltrate 600+d 600-400 Mg-unit Tabs (Calcium carbonate-vitamin d) .... Take 1 tablet by mouth once a day 3)  Multivitamins Tabs (Multiple vitamin) .... 1/2 by mouth two times a day 4)  Vitamin E 1000 Unit Caps (Vitamin e) .... Take 1 tablet by mouth once a day 5)  Potassium 99 Mg Tabs (Potassium) .... Take two tabs by mouth daily 6)  Pravastatin Sodium 40 Mg Tabs (Pravastatin sodium) .Marland Kitchen.. 1 tab by mouth qhs 7)  Flomax 0.4 Mg Caps (Tamsulosin hcl) .... Take 1 cap by mouth once daily 8)  Fish Oil 1000 Mg Caps (Omega-3 fatty acids) .... 2 capsules by mouth bid 9)  B-12 1000 Mcg Cr-tabs (Cyanocobalamin) .... Take 1 tablet by mouth once a day  Other Orders: T-Treadmill Complete/ Pharmacologic Stress (16109)  Patient Instructions: 1)  Will schedule your stress test at [facility]. 2)  Stay on current meds. 3)  Cut back on highly concentrated sweets and carbs. 4)  Continue to cut back on smoking. 5)  OK to add Claritin once daily x 7 days for residual eustachian tube congestion. 6)  Keep up the good work and return for  f/u cholesterol and prostate in 6 mos. Prescriptions: FLOMAX 0.4 MG CAPS (TAMSULOSIN HCL) Take 1 cap by mouth once daily  #30 Capsule x 6   Entered and Authorized by:   Seymour Bars DO   Signed by:   Seymour Bars DO on 11/29/2010   Method used:   Electronically to        [facility] [phone]* (retail)       [address] [place], [place]  [address]       Ph: [phone] or [phone]       Fax: [phone]   RxID:   [id number] PRAVASTATIN SODIUM 40 MG TABS (PRAVASTATIN SODIUM) 1 tab by mouth qhs  #30 Tablet x 6   Entered and Authorized by:   Seymour Bars DO   Signed by:   Seymour Bars DO on 11/29/2010   Method used:  Electronically to        [facility] [phone]* (retail)       [address] [place], [place]  [address]       Ph: [phone] or [phone]       Fax: [phone]   RxID:   [id number]    Orders Added: 1)  T-Treadmill Complete/ Pharmacologic Stress [93015] 2)  Est. Patient age 43-64 [phone]

## 2010-12-13 NOTE — Assessment & Plan Note (Signed)
Summary: lightheaded   Vital Signs:  Patient profile:   55 year old male Height:      73 inches Weight:      195 pounds BMI:     25.82 O2 Sat:      96 % on Room air Temp:     98.6 degrees F oral Pulse rate:   77 / minute Pulse (ortho):   80 / minute BP sitting:   127 / 84  (left arm) BP standing:   137 / 89 Cuff size:   large  Vitals Entered By: Payton Spark CMA (December 02, 2010 3:31 PM)  O2 Flow:  Room air  Serial Vital Signs/Assessments:  Time      Position  BP       Pulse  Resp  Temp     By 3:32 PM   Lying RA  150/96   80                    Payton Spark [facility] 3:32 PM   Sitting   139/95   82                    Payton Spark CMA 3:32 PM   Standing  137/89   80                    Payton Spark CMA  CC: Lightheaded this AM but feeling better now.    History of Present Illness: 55 yo WM presents for feeling lightheaded this AM after going to work.  His co workers told him that he looked pale and he broke out in a sweat.  Reports this is mostly positional with getting up quickly.  He denies any CP, plapitation, or SOB.  No new meds.  Drinking fluids thruought the day but does drink a lot of caffeine and has a hx of chronic constipations.  he has been straining for a few days to have a BM.  Denies any blood in the stool or recent diarrhea.  Denies N/V or fevers.    He has some mild diffuse abd pain and some bloating.  Denies any rotational vertigo.    Current Medications (verified): 1)  Aspir-Low 81 Mg Tbec (Aspirin) .... Take 1 Tablet By Mouth Two Times A Day 2)  Caltrate 600+d 600-400 Mg-Unit Tabs (Calcium Carbonate-Vitamin D) .... Take 1 Tablet By Mouth Once A Day 3)  Multivitamins  Tabs (Multiple Vitamin) .... 1/2 By Mouth Two Times A Day 4)  Vitamin E 1000 Unit Caps (Vitamin E) .... Take 1 Tablet By Mouth Once A Day 5)  Potassium 99 Mg Tabs (Potassium) .... Take Two Tabs By Mouth Daily 6)  Pravastatin Sodium 40 Mg Tabs (Pravastatin Sodium) .Marland Kitchen.. 1 Tab By Mouth Qhs 7)   Flomax 0.4 Mg Caps (Tamsulosin Hcl) .... Take 1 Cap By Mouth Once Daily 8)  Fish Oil 1000 Mg  Caps (Omega-3 Fatty Acids) .... 2 Capsules By Mouth Bid 9)  B-12 1000 Mcg Cr-Tabs (Cyanocobalamin) .... Take 1 Tablet By Mouth Once A Day  Allergies (verified): No Known Drug Allergies  Past History:  Past Medical History: Reviewed history from 10/08/2009 and no changes required. ankylosing spondylitis varicose veins pes planus tobacco abuse BPH colonoscopy 08, normal diverticulosis fatty liver dz  Past Surgical History: Reviewed history from 12/05/2006 and no changes required. inguinal hernia 1994 RTC surgery 2005 C spine ESI by Dr Penelope Galas  Social History: Reviewed history from 12/05/2006 and no  changes required. 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   Head:  normocephalic and atraumatic.   Eyes:  no nystagmus or scleral icterus Mouth:  pharynx pink and moist.   Neck:  no masses.   Lungs:  Normal respiratory effort, chest expands symmetrically. Lungs are clear to auscultation, no crackles or wheezes. Heart:  Normal rate and regular rhythm. S1 and S2 normal without gallop, murmur, click, rub or other extra sounds. Abdomen:  moderate distension and abd feels full of stool.  no focal tenderness.  no HSM. NABS Extremities:  no LE edema Skin:  no pallor, diaphoresis or jaundice Cervical Nodes:  No lymphadenopathy noted Psych:  good eye contact, not anxious appearing, and not depressed appearing.     Impression & Recommendations:  Problem # 1:  POSTURAL LIGHTHEADEDNESS (ICD-780.4) Assessment New UA normal and he does not meet criteria for true  orthostasis.  Encouraged pt to increase water intake, rest and work on constipation.  Call if anything changes. Orders: UA Dipstick w/o Micro (automated)  (81003)  Problem # 2:  CONSTIPATION (ICD-564.00) Assessment: Deteriorated KUB does show moderate retention of stool.  Pt went thru the same thing last year.   Will treat with stool softener daily + daily [place] of Miralax.  Increase water intake and fiber in diet.  If not seeing improvement in symptoms in the next wk, will do more of a w/u on him.  He did have a normal colonoscopy in the last 3 yrs.    Orders: T-DG ABD 1 View (74000)  Complete Medication List: 1)  Aspir-low 81 Mg Tbec (Aspirin) .... Take 1 tablet by mouth two times a day 2)  Caltrate 600+d 600-400 Mg-unit Tabs (Calcium carbonate-vitamin d) .... Take 1 tablet by mouth once a day 3)  Multivitamins Tabs (Multiple vitamin) .... 1/2 by mouth two times a day 4)  Vitamin E 1000 Unit Caps (Vitamin e) .... Take 1 tablet by mouth once a day 5)  Potassium 99 Mg Tabs (Potassium) .... Take two tabs by mouth daily 6)  Pravastatin Sodium 40 Mg Tabs (Pravastatin sodium) .Marland Kitchen.. 1 tab by mouth qhs 7)  Flomax 0.4 Mg Caps (Tamsulosin hcl) .... Take 1 cap by mouth once daily 8)  Fish Oil 1000 Mg Caps (Omega-3 fatty acids) .... 2 capsules by mouth bid 9)  B-12 1000 Mcg Cr-tabs (Cyanocobalamin) .... Take 1 tablet by mouth once a day  Patient Instructions: 1)  Xray abdomen today. 2)  Start bowel regimen and take it easy this weekend. 3)  Start Dulcolax stool softener 1-2 x a day with Miralax two times a day x 3 days then move to once daily and STAY on this until your follow up appt. 4)  Return for f/u in 2 wks.   Orders Added: 1)  UA Dipstick w/o Micro (automated)  [81003] 2)  T-DG ABD 1 View [74000] 3)  Est. Patient Level IV [16109]    Laboratory Results   Urine Tests    Routine Urinalysis   Color: yellow Appearance: Clear Glucose: negative   (Normal Range: Negative) Bilirubin:  negative   (Normal Range: Negative) Ketone: negative   (Normal Range: Negative)  Spec. Gravity: 1.010   (Normal Range: 1.003-1.035) Blood: negative   (Normal Range: Negative) pH: 7.0   (Normal Range: 5.0-8.0) Protein: negative   (Normal Range: Negative) Urobilinogen: 0.2   (Normal Range: 0-1) Nitrite: negative   (Normal Range: Negative) Leukocyte Esterace: trace   (Normal Range: Negative)        History of Present Illness:   yo WM presents for feeling lightheaded today.  He worked his 2 jobs yesterday and felt fine.  Today, he had to strain with a BM and felt lightheaded.  Denies abd pain.  Denies any melena or hematochezia.  He felt a little sweaty today and his co workers told him that he looked pale today.  His appetite has been normal.  Denies chest tightness or SOB.  Denies heart palpitations.  He is affected with getting up too fast.  Denies problem urinating.

## 2011-01-02 ENCOUNTER — Encounter: Payer: Self-pay | Admitting: Family Medicine

## 2011-01-02 ENCOUNTER — Ambulatory Visit
Admission: RE | Admit: 2011-01-02 | Discharge: 2011-01-02 | Disposition: A | Discharge status: Home / Self Care | Payer: 59 | Source: Ambulatory Visit | Attending: Family Medicine | Admitting: Family Medicine

## 2011-01-02 ENCOUNTER — Telehealth: Payer: Self-pay | Admitting: Family Medicine

## 2011-01-02 ENCOUNTER — Ambulatory Visit (INDEPENDENT_AMBULATORY_CARE_PROVIDER_SITE_OTHER): Payer: 59 | Admitting: Family Medicine

## 2011-01-02 VITALS — BP 143/98 | HR 94 | Ht 74.0 in | Wt 196.0 lb

## 2011-01-02 DIAGNOSIS — M5415 Radiculopathy, thoracolumbar region: Secondary | ICD-10-CM

## 2011-01-02 DIAGNOSIS — M5412 Radiculopathy, cervical region: Secondary | ICD-10-CM | POA: Insufficient documentation

## 2011-01-02 DIAGNOSIS — R030 Elevated blood-pressure reading, without diagnosis of hypertension: Secondary | ICD-10-CM

## 2011-01-02 DIAGNOSIS — IMO0002 Reserved for concepts with insufficient information to code with codable children: Secondary | ICD-10-CM

## 2011-01-02 MED ORDER — HYDROCODONE-ACETAMINOPHEN 7.5-500 MG PO TABS: 2.0000 | ORAL_TABLET | Freq: Three times a day (TID) | ORAL | Status: DC | PRN

## 2011-01-02 MED ORDER — METHYLPREDNISOLONE (PAK) 4 MG PO TABS: 4.0000 mg | ORAL_TABLET | Freq: Every day | ORAL | Status: DC

## 2011-01-02 NOTE — Telephone Encounter (Signed)
LMOM informing Pt of the above 

## 2011-01-02 NOTE — Progress Notes (Signed)
  Subjective:    Patient ID: Raymond Mitchell, male    DOB: 1956-07-28, 55 y.o.   MRN: [id number]  HPI  55 yo WM presents for pain in the lower back and R leg on and off for months but is getting worse.  He was not doing heavy lifting and did not have any trauma.  He had back surgery in 2009 and had complete relief of pain x 3 yrs.  His pain shoots all the way down to his toes with tingling and numbness. Other than constipation problems, he denies problems with change in bladder/ bowel habits.  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  His first surgery was with Dr Hali Marry.    BP 143/98  Pulse 94  Ht 6\' 2"  (1.88 m)  Wt 196 lb (88.905 kg)  BMI 25.16 kg/m2  SpO2 97%   Review of Systems  Constitutional: Negative for fever and unexpected weight change.  Respiratory: Negative for shortness of breath.   Cardiovascular: Negative for chest pain, palpitations and leg swelling.  Musculoskeletal: Positive for back pain and gait problem.  Neurological: Positive for numbness. Negative for weakness.       Objective:   Physical Exam  Constitutional: He appears well-developed and well-nourished. No distress.  HENT:  Head: Normocephalic and atraumatic.  Cardiovascular: Normal rate and regular rhythm.   Pulmonary/Chest: Effort normal and breath sounds normal.  Musculoskeletal: He exhibits no edema and no tenderness.       Lumbar back: He exhibits tenderness (tender midline at L5-S1 and over the R sciatic notch). He exhibits normal range of motion, no swelling, no edema and no spasm.  Neurological: He has normal strength. He exhibits normal muscle tone.  Reflex Scores:      Patellar reflexes are 1+ on the right side and 2+ on the left side.      Antalgic gait          Assessment & Plan:

## 2011-01-02 NOTE — Assessment & Plan Note (Signed)
BP high today in the setting of acute pain.  Will follow.

## 2011-01-02 NOTE — Telephone Encounter (Signed)
Pls let pt know that his back XRAY is + for degenerative changes, no sign of fracture, stable hardware.    Victorino Dike, please call Dr Hali Marry and schedule a f/u for him.

## 2011-01-02 NOTE — Assessment & Plan Note (Signed)
Hx of ankylosing spondylitis, lumbar DDD, hx of disc surgery with Dr Hali Marry in 2009, now with new onset, worsening R buttock/ R leg pain with tingling and numbness c/w radiculopathy.  Will get an Xray today, start  A Medrol dose Pack and Vicodin for severe pain.  Set up f/u with Dr Hali Marry to follow.

## 2011-01-02 NOTE — Patient Instructions (Signed)
Start Medrol dose pack today.  Avoid combining with OTC anti inflammatories. Use Hydrocodone/ Acetominophen up to 3 x a day for severe pain.  This may make you sleepy.  Watch for constipating side effects.  Avoid taking OTC Tylenol with this.  Xray L spine today. Will call you w/ results tomorrow.  Will get you back in with Dr Hali Marry to follow.

## 2011-01-20 ENCOUNTER — Telehealth: Payer: Self-pay | Admitting: Family Medicine

## 2011-01-20 NOTE — Telephone Encounter (Signed)
[id number]

## 2011-01-23 NOTE — Telephone Encounter (Signed)
I faxed referral and ov notes to Dr.kritzers office and spoke with Judeth Cornfield there at that office and she informed me that he would review and then they will call the patient and schedule.

## 2011-01-26 ENCOUNTER — Other Ambulatory Visit: Payer: Self-pay | Admitting: Neurosurgery

## 2011-01-26 DIAGNOSIS — M545 Low back pain, unspecified: Secondary | ICD-10-CM

## 2011-01-28 ENCOUNTER — Ambulatory Visit
Admission: RE | Admit: 2011-01-28 | Discharge: 2011-01-28 | Disposition: A | Discharge status: Home / Self Care | Payer: 59 | Source: Ambulatory Visit | Attending: Neurosurgery | Admitting: Neurosurgery

## 2011-01-28 DIAGNOSIS — M545 Low back pain, unspecified: Secondary | ICD-10-CM

## 2011-02-03 NOTE — Telephone Encounter (Signed)
Pls let pt know that his stress echo came back normal, only had findings of Hypertensive response to exercise, meaning we need to work on improving exercise tolerance.

## 2011-02-03 NOTE — Telephone Encounter (Signed)
Pt aware of the above. Pt does have ruptured disc and is being treated by Dr. Hali Marry.

## 2011-02-13 ENCOUNTER — Telehealth: Payer: Self-pay | Admitting: Family Medicine

## 2011-02-13 NOTE — Telephone Encounter (Signed)
Pt called and would like to know if we can do a handicap form for him so he does not have to drive all the way to [place] to Dr. Phill Mutter office who apparently sees him for his back.  Going to be out another 2 mths. Please advise. Plan:  Routed to Dr. Arlice Colt, LPN Domingo Dimes

## 2011-02-13 NOTE — Telephone Encounter (Signed)
I don't have any notes from his neurosurgeon to back this up.  Darl Pikes, pls call Dr Hali Marry for the last OV note and pt can drop off temporary handicapt form here this week.

## 2011-02-13 NOTE — Telephone Encounter (Signed)
Pt notified that we need to get the last dictated note from Dr. Hali Marry office (neurosurgeon).  Office notified and they will fax over the last dictated office note for Dr. Cathey Endow to review.  Pt informed that he can drop off the temp handicap form for Dr. Cathey Endow to fill out this week and we will notify him when completed.  Pt voiced understanding. Jarvis Newcomer, LPN [facility] Dimes

## 2011-02-14 NOTE — Op Note (Signed)
NAME:  JIMEL, MYLER NO.:  [id number]   MEDICAL RECORD NO.:  [id number]          PATIENT TYPE:  INP   LOCATION:  2899                         FACILITY:  [facility]   PHYSICIAN:  Reinaldo Meeker, M.D. DATE OF BIRTH:  [birth year]   DATE OF PROCEDURE:  12/05/2007  DATE OF DISCHARGE:                               OPERATIVE REPORT   PREOPERATIVE DIAGNOSIS:  Spinal stenosis and herniated disk, L4-5.   POSTOPERATIVE DIAGNOSIS:  Spinal stenosis and herniated disk, L4-5.   PROCEDURE:  Bilateral L4-5 decompressive laminotomies followed by right  L4-5 microdiskectomy, followed by L4-5 interspinous fusion, followed by  L4-5 nonsegmental posterior instrumentation with spinous process clamp.   SECONDARY PROCEDURE:  Microdissection L4-5 disk and L5 nerve root  bilaterally.   SURGEON:  Dr. Gerlene Fee.   ASSISTANT:  Dr. Marikay Alar.   PROCEDURE IN DETAIL:  After being placed in the prone position, the  patient's back was prepped and draped in the usual sterile fashion.  Localizing x-rays were taken prior to incision to identify the  appropriate level.  Midline incision was made above the spinous  processes of L4-L5.  Using the Bovie cutting current, the incision was  carried down to the spinous processes.  Subperiosteal dissection was  then carried out bilaterally on the spinous processes, lamina, and facet  joint.  Self-retaining retractor was placed for exposure.  X-rays showed  approach at the appropriate level.  Starting on the patient's left side,  laminotomy was performed by removing the inferior one half of the L4  lamina, medial third of facet joint, the superior one third of the L5  lamina.  Residual bone and ligamentum flavum removed in a piecemeal  fashion.  Similar decompression was then carried out on the opposite  side, once again removing the inferior half of the L4 lamina and the  medial one third of the facet joint and the superior one third of L5  lamina.  Once  again, residual bone was removed along with hypertrophic  ligamentum flavum.  All five nerve roots were tracked out the foramen  bilaterally until they were well decompressed.  At this time, microscope  was brought in the field and used until the end of the case.  On the  right side, this was identified and found be diffusely herniated.  After  coagulating on the annulus, this annulus was incised with a 15 blade.  Using pituitary rongeurs and curettes, thorough disk space clean-out was  carried out.  At the same time, great care was taken avoid injury to the  neural elements, and this was successfully done.  Inspection of the disk  on the left side showed it now to be flat and in no need of incision.  The interspinous space was then prepared for fusion.  Soft tissue  interspinous ligament was removed, and all graspers were used to scrape  up and down on the inferior edge of the L4 spinous process and superior  edge of L5 spinous process.  Nice mild denuding of the bone was  obtained, and no soft tissue was remaining.  Sizers were used, and a 14-  mm spacer was found to be a good fit.  A 14-mm bone was then chosen.  After we again once more confirmed hemostasis with Gelfoam on the dura,  the interspinous plug was placed into good position which was confirmed  by fluoroscopy.  A 55-mm interspinous plate was then chosen.  Under  fluoroscopic guidance, this was point in a good position and then  compressed.  final fluoroscopy in AP and lateral direction showed  excellent placement of the bony spacer interspinous plate.  Irrigation  was carried out once more.  Any bleeding controlled with unipolar coagulation.  The wound was then  closed in multiple layers of Vicryl on the muscle fascia, subcutaneous,  and subcuticular tissues.  Staples were placed on the skin.  A sterile  dressing was then applied.  The patient was extubated, taken to recovery  room in stable condition.            ______________________________  Reinaldo Meeker, M.D.     ROK/MEDQ  D:  12/05/2007  T:  12/05/2007  Job:  [id number]

## 2011-03-09 ENCOUNTER — Encounter: Payer: Self-pay | Admitting: Family Medicine

## 2011-03-09 ENCOUNTER — Ambulatory Visit (INDEPENDENT_AMBULATORY_CARE_PROVIDER_SITE_OTHER): Payer: 59 | Admitting: Family Medicine

## 2011-03-09 VITALS — BP 127/90 | HR 100 | Ht 71.0 in | Wt 198.0 lb

## 2011-03-09 DIAGNOSIS — K1370 Unspecified lesions of oral mucosa: Secondary | ICD-10-CM

## 2011-03-09 DIAGNOSIS — K121 Other forms of stomatitis: Secondary | ICD-10-CM

## 2011-03-09 NOTE — Patient Instructions (Signed)
We will call you with your referral.

## 2011-03-09 NOTE — Progress Notes (Signed)
  Subjective:    Patient ID: Raymond Mitchell, male    DOB: 1956/07/25, 55 y.o.   MRN: [id number]  HPI Saw his Dr. Dallie Piles his regular dentist for an ulceration that started last Tues and Wed. Recently had back surgery. Has cut back on his smoking, drinking. Used a whitener and it caused the gums to become inflammed and it made the ulcer larger. Then started to become painful. Was on hydrocone at the time.  Stated getting HA the last couple of days. Ice did help his mouth and jaw pain.   His dentist wants to refer him to the oral surgery. Needs referal from PCP for this because under his medial insurance instead of dental.     Review of Systems     Objective:   Physical Exam  Constitutional: He appears well-developed and well-nourished.  HENT:       Ulceration on the lower jaw behind his teeth.           Assessment & Plan:  Mouth ulceration 0- this is large enough he does need to see an oral surgeon. He  Brought in a paper form an office he has been to before so will try to refer there. He is using a past that the dentist recommended.

## 2011-03-21 ENCOUNTER — Other Ambulatory Visit: Payer: Self-pay | Admitting: Family Medicine

## 2011-03-29 ENCOUNTER — Other Ambulatory Visit: Payer: Self-pay | Admitting: Neurosurgery

## 2011-03-29 DIAGNOSIS — M545 Low back pain, unspecified: Secondary | ICD-10-CM

## 2011-03-30 ENCOUNTER — Ambulatory Visit
Admission: RE | Admit: 2011-03-30 | Discharge: 2011-03-30 | Disposition: A | Discharge status: Home / Self Care | Payer: 59 | Source: Ambulatory Visit | Attending: Neurosurgery | Admitting: Neurosurgery

## 2011-03-30 DIAGNOSIS — M545 Low back pain, unspecified: Secondary | ICD-10-CM

## 2011-03-30 MED ORDER — GADOBENATE DIMEGLUMINE 529 MG/ML IV SOLN
20.0000 mL | Freq: Once | INTRAVENOUS | Status: AC | PRN
  Administered 2011-03-30: 20 mL via INTRAVENOUS

## 2011-04-19 ENCOUNTER — Other Ambulatory Visit: Payer: Self-pay | Admitting: Family Medicine

## 2011-05-04 ENCOUNTER — Telehealth: Payer: Self-pay | Admitting: Family Medicine

## 2011-05-04 DIAGNOSIS — E785 Hyperlipidemia, unspecified: Secondary | ICD-10-CM

## 2011-05-04 DIAGNOSIS — K760 Fatty (change of) liver, not elsewhere classified: Secondary | ICD-10-CM

## 2011-05-04 NOTE — Telephone Encounter (Signed)
Addended by: Nani Gasser D on: 05/04/2011 09:38 PM   Modules accepted: Orders

## 2011-05-04 NOTE — Telephone Encounter (Signed)
Is he coming in for a CPE?

## 2011-05-04 NOTE — Telephone Encounter (Signed)
Patient was wondering if he needs to get labs done before his next ov? Call patient and let him know

## 2011-05-05 NOTE — Telephone Encounter (Signed)
Addended by: Nani Gasser D on: 05/05/2011 07:54 AM   Modules accepted: Orders

## 2011-05-05 NOTE — Telephone Encounter (Signed)
Labs ordered.

## 2011-05-23 ENCOUNTER — Encounter: Payer: Self-pay | Admitting: Family Medicine

## 2011-05-30 ENCOUNTER — Ambulatory Visit (INDEPENDENT_AMBULATORY_CARE_PROVIDER_SITE_OTHER): Payer: 59 | Admitting: Family Medicine

## 2011-05-30 ENCOUNTER — Encounter: Payer: Self-pay | Admitting: Family Medicine

## 2011-05-30 VITALS — BP 141/93 | HR 85 | Ht 73.0 in | Wt 202.0 lb

## 2011-05-30 DIAGNOSIS — E785 Hyperlipidemia, unspecified: Secondary | ICD-10-CM

## 2011-05-30 DIAGNOSIS — IMO0001 Reserved for inherently not codable concepts without codable children: Secondary | ICD-10-CM

## 2011-05-30 DIAGNOSIS — Z23 Encounter for immunization: Secondary | ICD-10-CM

## 2011-05-30 DIAGNOSIS — R030 Elevated blood-pressure reading, without diagnosis of hypertension: Secondary | ICD-10-CM

## 2011-05-30 DIAGNOSIS — R7309 Other abnormal glucose: Secondary | ICD-10-CM

## 2011-05-30 LAB — COMPLETE METABOLIC PANEL WITH GFR
ALT: 18 U/L (ref 0–53)
AST: 18 U/L (ref 0–37)
Albumin: 4.5 g/dL (ref 3.5–5.2)
Alkaline Phosphatase: 61 U/L (ref 39–117)
Glucose, Bld: 105 mg/dL — ABNORMAL HIGH (ref 70–99)
Potassium: 4.1 mEq/L (ref 3.5–5.3)
Sodium: 139 mEq/L (ref 135–145)
Total Protein: 6.4 g/dL (ref 6.0–8.3)

## 2011-05-30 LAB — LIPID PANEL
LDL Cholesterol: 149 mg/dL — ABNORMAL HIGH (ref 0–99)
Total CHOL/HDL Ratio: 5 Ratio
VLDL: 18 mg/dL (ref 0–40)

## 2011-05-30 MED ORDER — PRAVASTATIN SODIUM 40 MG PO TABS: 40.0000 mg | ORAL_TABLET | Freq: Every day | ORAL | Status: DC

## 2011-05-30 NOTE — Patient Instructions (Signed)
Let recheck you cholesterol in 6 months.   Lets recheck your pressure in 2-3 weeks.

## 2011-05-30 NOTE — Progress Notes (Signed)
  Subjective:    Patient ID: Raymond Mitchell, male    DOB: 1956-04-07, 55 y.o.   MRN: [id number]  Hyperlipidemia This is a chronic problem. The problem is controlled. Recent lipid tests were reviewed and are high. Factors aggravating his hyperlipidemia include no known factors. Pertinent negatives include no chest pain or shortness of breath. Current antihyperlipidemic treatment includes statins. There are no compliance problems.   He ran out of his cholesterol med last week but says that is the first time he has not taken it.  Nomally very compliant.   He plans on quitting smoking. He has already bought the patch.  Tried the gum once but wasn't ready to quit this.   Here to review his recent labwork.   Review of Systems  Respiratory: Negative for shortness of breath.   Cardiovascular: Negative for chest pain.       Objective:   Physical Exam  Constitutional: He is oriented to person, place, and time. He appears well-developed and well-nourished.  HENT:  Head: Normocephalic and atraumatic.  Neck: Neck supple. No thyromegaly present.  Cardiovascular: Normal rate, regular rhythm and normal heart sounds.   Pulmonary/Chest: Effort normal and breath sounds normal.  Musculoskeletal: He exhibits no edema.  Neurological: He is alert and oriented to person, place, and time.  Skin: Skin is warm and dry.  Psychiatric: He has a normal mood and affect. His behavior is normal.          Assessment & Plan:  Abnormal glucose - discussed dx.  Discussed changing his diet to avoid sweets and dec his carb intake. Keep up the exercise. Work on weight loss.    Elevated BP today- F/U in2-3 weeks to recheck pressure.   Tob Abuse - Gave encouragement. I am excited that he is ready to quit.

## 2011-05-30 NOTE — Assessment & Plan Note (Signed)
Restart statin. New rx sent. Recheck in 6 month.He definitely needs to med. Reviewed his labs with him.

## 2011-06-02 ENCOUNTER — Encounter: Payer: Self-pay | Admitting: Family Medicine

## 2011-06-13 ENCOUNTER — Ambulatory Visit (INDEPENDENT_AMBULATORY_CARE_PROVIDER_SITE_OTHER): Payer: 59 | Admitting: Family Medicine

## 2011-06-13 ENCOUNTER — Encounter: Payer: Self-pay | Admitting: Family Medicine

## 2011-06-13 VITALS — BP 140/92 | HR 106 | Wt 200.0 lb

## 2011-06-13 DIAGNOSIS — I10 Essential (primary) hypertension: Secondary | ICD-10-CM

## 2011-06-13 MED ORDER — LISINOPRIL 10 MG PO TABS: 10.0000 mg | ORAL_TABLET | Freq: Every day | ORAL | Status: DC

## 2011-06-13 NOTE — Patient Instructions (Addendum)
Start Lisinopril once a day.  [place], Lifestyle

## 2011-06-13 NOTE — Progress Notes (Signed)
  Subjective:    Patient ID: Raymond Mitchell, male    DOB: Jul 24, 1956, 55 y.o.   MRN: [id number]  HPI  Here to recheck BP. No CP or SOB. Did have caffeine this AM Usually drinks 2 ups of coffee in the AM.   Review of Systems     Objective:   Physical Exam  Constitutional: He is oriented to person, place, and time. He appears well-developed and well-nourished.  HENT:  Head: Normocephalic and atraumatic.  Cardiovascular: Normal rate, regular rhythm and normal heart sounds.   Pulmonary/Chest: Effort normal and breath sounds normal.  Neurological: He is alert and oriented to person, place, and time.  Skin: Skin is warm and dry.  Psychiatric: He has a normal mood and affect. His behavior is normal.          Assessment & Plan:  HTN- Elevated today. Start BP med and f.u in 6 weeks. Recheck BMP at that time. Also may want to buy a home cuff. He is exercising regularly now which is great. Has lost 2 lbs.

## 2011-06-23 LAB — CBC
MCHC: 34.3
RDW: 12.6

## 2011-06-23 LAB — TYPE AND SCREEN
ABO/RH(D): O POS
Antibody Screen: NEGATIVE

## 2011-06-23 LAB — ABO/RH: ABO/RH(D): O POS

## 2011-07-10 ENCOUNTER — Telehealth: Payer: Self-pay | Admitting: Family Medicine

## 2011-07-10 NOTE — Telephone Encounter (Signed)
Stop med for one week and then see if feels better off of it. If feels better off of it then can change to toprol 25mg  XL once a day.

## 2011-07-10 NOTE — Telephone Encounter (Signed)
Pt called back and inquiring to know if doctor had reviewed his phone note.   PLan: Talked with Dr. Linford Arnold and said may consider changing medication and or adjusting the one pt is on.  Pt recently started on lisinopril 10 mg but just started the med two weeks ago and pt's symptoms started within the second week after starting the medication.  Please advise. PLan:  Routed to Dr. Marlyne Beards, LPN [facility]

## 2011-07-10 NOTE — Telephone Encounter (Signed)
Pt called and stated he is experiencing nausea and light-headed, tiredness, and BP Sat was 140/90.  Recently started on lisinopril 10 mg 06-13-11.  Please advise. Plan:  Do you want to bring pt in for nurse visit?  Pt does have sched 6 week follow up. Routed to Dr. Marlyne Beards, LPN [facility]

## 2011-07-10 NOTE — Telephone Encounter (Signed)
Pt informed to stop the lisinopril and to call back in 1 week and let know how doing.  If better, will start the toprol XL 25 mg. Jarvis Newcomer, LPN [facility]

## 2011-07-18 ENCOUNTER — Telehealth: Payer: Self-pay | Admitting: Family Medicine

## 2011-07-18 NOTE — Telephone Encounter (Signed)
Pt called and said he had stopped the lisinopril medication as he was instructed 1 week ago.  Was told to call back and give update.  If feels better off the lisinopril 10 mg then can start toprol XL.  However, pt is still complaining of lightheadedness, constipation.  Please advise as pt symptoms did not get better when he stopped the lisinopril 10 mg. Plan:  Routed to Dr. Estill Bakes, LPN [facility]

## 2011-07-18 NOTE — Telephone Encounter (Signed)
Since he is still having symptoms he will need to be evaluate again. Either next week w/Dr Linford Arnold or w/me Thursday depends on what symptoms he is still having and how bad his symptoms are.

## 2011-07-19 NOTE — Telephone Encounter (Signed)
Pt notified back this am to see how he was doing.  Pt said he was instructed to be off the lisinopril for a week but he cheated and took it this past Monday and obviously by this note he called in yesterday feeling still lightheaded.  Pt  Did not follow doctor instructions as inforced by the nurse. Plan:  Pt instructed this am since no lisinopril yesterday to stay off the BP med another 6 days and call triage nurse back next Tuesday with an updated report.  Need to definitely know if pt symptoms of dizziness disepated after 1 week or not.  If so, will start the Toprol XL medication as Dr. Linford Arnold order stated. Raymond Newcomer, LPN [facility]

## 2011-07-25 ENCOUNTER — Telehealth: Payer: Self-pay | Admitting: Family Medicine

## 2011-07-25 MED ORDER — METOPROLOL SUCCINATE ER 25 MG PO TB24: 25.0000 mg | ORAL_TABLET | Freq: Every day | ORAL | Status: DC

## 2011-07-25 NOTE — Telephone Encounter (Signed)
Pt informed and his recent future appt rescheduled to 08-22-11.  Pt aware. Jarvis Newcomer, LPN [facility]

## 2011-07-25 NOTE — Telephone Encounter (Signed)
I discontinued her lisinopril from his medication list and add it to his intolerances. I will send her for Toprol or metoprolol extended release once a day in its place. I like this have him start this daily and followup with me in 3 or 4 weeks to see how he is tolerating the new medication.

## 2011-07-25 NOTE — Telephone Encounter (Signed)
Pt called and said he had been off the lisinopril 10 mg and all his symptoms have disepated.   Plan:  Routed call to Dr. Linford Arnold to consider a different BP med. Jarvis Newcomer, LPN [facility]

## 2011-07-27 ENCOUNTER — Ambulatory Visit: Payer: 59 | Admitting: Family Medicine

## 2011-07-30 ENCOUNTER — Other Ambulatory Visit: Payer: Self-pay | Admitting: Family Medicine

## 2011-08-22 ENCOUNTER — Other Ambulatory Visit: Payer: Self-pay | Admitting: Family Medicine

## 2011-08-22 ENCOUNTER — Ambulatory Visit (INDEPENDENT_AMBULATORY_CARE_PROVIDER_SITE_OTHER): Payer: 59 | Admitting: Family Medicine

## 2011-08-22 ENCOUNTER — Encounter: Payer: Self-pay | Admitting: Family Medicine

## 2011-08-22 VITALS — BP 134/80 | HR 58 | Wt 201.0 lb

## 2011-08-22 DIAGNOSIS — D485 Neoplasm of uncertain behavior of skin: Secondary | ICD-10-CM

## 2011-08-22 DIAGNOSIS — I10 Essential (primary) hypertension: Secondary | ICD-10-CM

## 2011-08-22 DIAGNOSIS — L858 Other specified epidermal thickening: Secondary | ICD-10-CM

## 2011-08-22 MED ORDER — METOPROLOL SUCCINATE ER 25 MG PO TB24: 25.0000 mg | ORAL_TABLET | Freq: Every day | ORAL | Status: DC

## 2011-08-22 NOTE — Patient Instructions (Signed)
Great job on [employer], exercise, and weight loss! We will call you with the bx results.

## 2011-08-22 NOTE — Progress Notes (Signed)
  Subjective:    Patient ID: Raymond Mitchell, male    DOB: 1955/12/31, 55 y.o.   MRN: [id number]  Hypertension This is a chronic problem. The current episode started more than 1 year ago. The problem is unchanged. The problem is controlled. Pertinent negatives include no chest pain or shortness of breath. There are no associated agents to hypertension. The current treatment provides moderate improvement. There are no compliance problems.    Lesion on the right leg that he noticed 2 months. Tender if hit it. Has been getting a little larger. Says had similar lesion in almost the exact same spot years ago that he had to have removed.    Review of Systems  Respiratory: Negative for shortness of breath.   Cardiovascular: Negative for chest pain.       Objective:   Physical Exam  Constitutional: He is oriented to person, place, and time. He appears well-developed and well-nourished.  HENT:  Head: Normocephalic and atraumatic.  Cardiovascular: Normal rate, regular rhythm and normal heart sounds.   Pulmonary/Chest: Effort normal and breath sounds normal.  Neurological: He is alert and oriented to person, place, and time.  Skin: Skin is warm and dry.       Right inner thigh with larger dome shaped pink lesion with a black hyperkeratotic core.  Psychiatric: He has a normal mood and affect. His behavior is normal.    Area cleaned with iodine and numbed with 5 cc of 1% lidocaine w/ epi and shave bx performed. Will be sent to pathology. Aluminum chloriide applied to achieve hemostasis and bandage applied.  0% blood loss. Pt tolerated well.       Assessment & Plan:  HTN- Well controlled.  F/U in 6 months. Doing well on the toprol. RF sent.   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 Will send to further eval to pathology to rule out SCC.  If + will need referral to [facility] for full excision. Also if it is keratocanthom but recurring then consider full excision thought often these spontaneously regress.

## 2011-08-29 ENCOUNTER — Other Ambulatory Visit: Payer: Self-pay | Admitting: Family Medicine

## 2011-08-29 DIAGNOSIS — C44721 Squamous cell carcinoma of skin of unspecified lower limb, including hip: Secondary | ICD-10-CM

## 2011-09-04 ENCOUNTER — Encounter: Payer: Self-pay | Admitting: *Deleted

## 2011-09-12 ENCOUNTER — Telehealth: Payer: Self-pay | Admitting: Family Medicine

## 2011-09-12 NOTE — Telephone Encounter (Signed)
Patient has a referral for Dermatology and his appt is scheduled for 10-12-11 and patient states that this place on his leg is back and has become irritating and did not know if there is anything he can put on it or do for it in the mean time until his Derm appt.? Patient has applied vaseline to it. Patient states it rubs against his pants. Any suggestions? Thanks, [employer]

## 2011-10-26 ENCOUNTER — Other Ambulatory Visit: Payer: Self-pay | Admitting: Neurosurgery

## 2011-10-26 DIAGNOSIS — M79604 Pain in right leg: Secondary | ICD-10-CM

## 2011-10-26 DIAGNOSIS — M25551 Pain in right hip: Secondary | ICD-10-CM

## 2011-10-26 DIAGNOSIS — M545 Low back pain, unspecified: Secondary | ICD-10-CM

## 2011-10-27 NOTE — Progress Notes (Signed)
Patient called to say he has been prescribed an antibiotic for a gum infection following dental work a week ago.  Wanted to know if taking it would compromise having his myelogram on Tuesday, 10/31/11.  I told him to please go ahead and take antibiotic, as we wouldn't do procedure with him having an active infection.  jkl

## 2011-10-31 ENCOUNTER — Ambulatory Visit
Admission: RE | Admit: 2011-10-31 | Discharge: 2011-10-31 | Disposition: A | Discharge status: Home / Self Care | Payer: 59 | Source: Ambulatory Visit | Attending: Neurosurgery | Admitting: Neurosurgery

## 2011-10-31 DIAGNOSIS — M545 Low back pain, unspecified: Secondary | ICD-10-CM

## 2011-10-31 DIAGNOSIS — M79604 Pain in right leg: Secondary | ICD-10-CM

## 2011-10-31 DIAGNOSIS — M25551 Pain in right hip: Secondary | ICD-10-CM

## 2011-10-31 MED ORDER — ONDANSETRON HCL 4 MG/2ML IJ SOLN: 4.0000 mg | Freq: Four times a day (QID) | INTRAMUSCULAR | Status: DC | PRN

## 2011-10-31 MED ORDER — IOHEXOL 180 MG/ML  SOLN
20.0000 mL | Freq: Once | INTRAMUSCULAR | Status: AC | PRN
  Administered 2011-10-31: 20 mL via INTRATHECAL

## 2011-10-31 MED ORDER — DIAZEPAM 5 MG PO TABS
10.0000 mg | ORAL_TABLET | Freq: Once | ORAL | Status: AC
  Administered 2011-10-31: 10 mg via ORAL

## 2011-10-31 NOTE — Progress Notes (Signed)
Explained discharge instructions, consent signed and valium given.  1055 Raymond Mitchell, pt step son called to pick up pt at 11:30 am.

## 2011-10-31 NOTE — Progress Notes (Deleted)
1030 discharge instructions explained, consent signed and valium will be given.   1055 Steve pt's step son called to pick up pt at 11:30 am

## 2011-12-03 ENCOUNTER — Other Ambulatory Visit: Payer: Self-pay | Admitting: Family Medicine

## 2012-02-01 ENCOUNTER — Encounter: Payer: Self-pay | Admitting: Family Medicine

## 2012-02-01 ENCOUNTER — Ambulatory Visit (INDEPENDENT_AMBULATORY_CARE_PROVIDER_SITE_OTHER): Payer: Self-pay | Admitting: Family Medicine

## 2012-02-01 VITALS — BP 137/86 | HR 80 | Temp 98.2°F | Ht 74.0 in | Wt 209.0 lb

## 2012-02-01 DIAGNOSIS — D239 Other benign neoplasm of skin, unspecified: Secondary | ICD-10-CM

## 2012-02-01 DIAGNOSIS — D229 Melanocytic nevi, unspecified: Secondary | ICD-10-CM

## 2012-02-01 DIAGNOSIS — J309 Allergic rhinitis, unspecified: Secondary | ICD-10-CM

## 2012-02-01 DIAGNOSIS — Z720 Tobacco use: Secondary | ICD-10-CM

## 2012-02-01 DIAGNOSIS — F17200 Nicotine dependence, unspecified, uncomplicated: Secondary | ICD-10-CM

## 2012-02-01 DIAGNOSIS — L219 Seborrheic dermatitis, unspecified: Secondary | ICD-10-CM

## 2012-02-01 DIAGNOSIS — E785 Hyperlipidemia, unspecified: Secondary | ICD-10-CM

## 2012-02-01 DIAGNOSIS — I10 Essential (primary) hypertension: Secondary | ICD-10-CM

## 2012-02-01 MED ORDER — MOMETASONE FUROATE 50 MCG/ACT NA SUSP: 2.0000 | Freq: Every day | NASAL | Status: DC

## 2012-02-01 MED ORDER — METOPROLOL SUCCINATE ER 50 MG PO TB24: 50.0000 mg | ORAL_TABLET | Freq: Every day | ORAL | Status: DC

## 2012-02-01 MED ORDER — KETOCONAZOLE 2 % EX SHAM: MEDICATED_SHAMPOO | CUTANEOUS | Status: DC

## 2012-02-01 NOTE — Progress Notes (Signed)
  Subjective:    Patient ID: Raymond Mitchell, male    DOB: Jul 01, 1956, 56 y.o.   MRN: [id number]  HPI Tob Abuse - he has been on the nicotine patch for 5 weeks.  He is doing well. Has already noticed that cig smell is bothering him and felt nausted when he did smoke.   Back Pain - Went back to see the surgeon. Had a myelogram and it showed scar tissue. Has had 2 epidural steroid injection and the 2nd has really helped.    HTN- No CP or SOB.  Taking his meds reguarly.   Hyperlipidemia - Tolerating his statin well. No myalgias.   Nasal congestion for over a week. No fever. Did get better with Allegra  Says woke up feeling worse on Monday but better today.  No ear pain or pressure.  No HA with it.  Mild ST.    Review of Systems     Objective:   Physical Exam  Constitutional: He is oriented to person, place, and time. He appears well-developed and well-nourished.  HENT:  Head: Normocephalic and atraumatic.  Neck: Neck supple. No thyromegaly present.  Cardiovascular: Normal rate, regular rhythm and normal heart sounds.   No murmur heard.      No carotid bruits.   Pulmonary/Chest: Effort normal and breath sounds normal.  Lymphadenopathy:    He has no cervical adenopathy.  Neurological: He is alert and oriented to person, place, and time.  Skin: Skin is warm and dry.       Very dry scalp.    Psychiatric: He has a normal mood and affect. His behavior is normal.          Assessment & Plan:  HTN - Inc metoprolol 50mg  XR daily. F/U in 6 months. Follow BP at home.  Tobacco Abuse - doing well. Continue nicotine ptach.    AR- will add nasal steroid. Continue the allegra. If not better in 5-7 days then call and will consider tx with ABX for possible sinusitis.   Hyperlipidemia - due to recheck levels. Was not at goal in August. Continue statin for now.    SEborrheaic dermatitis. - Discussed start with ketoconazole shampoo. He will use daily for one week and used to 3 times per week  for maintenance. In between I recommend using T-Gel shampoo.  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 He will schedule the same.  Seborrheic keratoses. He has several on his back. I recommended that we can freeze these if he would like. Patient will schedule the same time as the atypical nevus biopsy.

## 2012-02-01 NOTE — Patient Instructions (Signed)
Schedule for bx on left temple and freezing of moles on back.

## 2012-02-12 ENCOUNTER — Other Ambulatory Visit: Payer: Self-pay | Admitting: Family Medicine

## 2012-02-12 ENCOUNTER — Telehealth: Payer: Self-pay | Admitting: *Deleted

## 2012-02-12 NOTE — Telephone Encounter (Signed)
Can stay at half a pill and keep regularly scheduled followup.

## 2012-02-12 NOTE — Telephone Encounter (Signed)
Pt states over the weekend he had been feeling light headed. States he has cut his BP med in half. States that he is still feeling light headed at times. Would like to know if he should go back to the whole pill or what else needs to be done. Please advise.

## 2012-02-13 NOTE — Telephone Encounter (Signed)
Pt will see [name] on Wed.

## 2012-02-14 ENCOUNTER — Other Ambulatory Visit: Payer: Self-pay | Admitting: Family Medicine

## 2012-02-14 ENCOUNTER — Ambulatory Visit (INDEPENDENT_AMBULATORY_CARE_PROVIDER_SITE_OTHER): Payer: BC Managed Care – PPO | Admitting: Physician Assistant

## 2012-02-14 ENCOUNTER — Encounter: Payer: Self-pay | Admitting: Physician Assistant

## 2012-02-14 VITALS — BP 129/93 | HR 97 | Temp 98.3°F | Ht 74.0 in | Wt 209.0 lb

## 2012-02-14 DIAGNOSIS — R42 Dizziness and giddiness: Secondary | ICD-10-CM

## 2012-02-14 DIAGNOSIS — H8110 Benign paroxysmal vertigo, unspecified ear: Secondary | ICD-10-CM

## 2012-02-14 LAB — COMPLETE METABOLIC PANEL WITH GFR
Albumin: 4.3 g/dL (ref 3.5–5.2)
Alkaline Phosphatase: 76 U/L (ref 39–117)
CO2: 28 mEq/L (ref 19–32)
GFR, Est Non African American: 69 mL/min
Glucose, Bld: 106 mg/dL — ABNORMAL HIGH (ref 70–99)
Potassium: 4.4 mEq/L (ref 3.5–5.3)
Sodium: 143 mEq/L (ref 135–145)
Total Protein: 6.7 g/dL (ref 6.0–8.3)

## 2012-02-14 LAB — LIPID PANEL: Total CHOL/HDL Ratio: 4 Ratio

## 2012-02-14 MED ORDER — PRAVASTATIN SODIUM 80 MG PO TABS: 80.0000 mg | ORAL_TABLET | Freq: Every day | ORAL | Status: DC

## 2012-02-14 NOTE — Progress Notes (Signed)
  Subjective:    Patient ID: Raymond Mitchell, male    DOB: 10-01-1956, 56 y.o.   MRN: [id number]  HPI Patient presents to clinic with 4 days of dizziness. He has had this before and was due to his blood pressure medications once he change bp meds it went away. His metoprolol has recently been increased to 50mg  daily for the last week so he cut back to 25mg . He still remains dizzy. The dizziness is better after walking around and throughout the day worse in the morning. No chest pain or SOB. He has not passed out or had any head trauma. He does drink 2 cups of coffee every morning but he does drink water throughout the day. He denies any allergies or symptoms.  He checks his bp and they are 130's over 90's.      Review of Systems     Objective:   Physical Exam  Constitutional: He is oriented to person, place, and time. He appears well-developed and well-nourished.  HENT:  Head: Normocephalic and atraumatic.  Right Ear: External ear normal.  Left Ear: External ear normal.  Nose: Nose normal.  Mouth/Throat: Oropharynx is clear and moist. No oropharyngeal exudate.       TM's normal. NO maxillary tenderness.   Eyes: Conjunctivae are normal.  Neck: Normal range of motion. Neck supple.  Cardiovascular: Normal rate, regular rhythm and normal heart sounds.   Pulmonary/Chest: Effort normal and breath sounds normal. He has no wheezes.  Lymphadenopathy:    He has no cervical adenopathy.  Neurological: He is alert and oriented to person, place, and time.       Dix-Hallpike positive for dizziness only when head was positioned to the left. Eye  Skin: Skin is warm and dry.  Psychiatric: He has a normal mood and affect. His behavior is normal.          Assessment & Plan:  Dizziness/BPV- Orthostatic bp were unremarkable. Dix-Hallpike testing positive for the left. Stay on 1/2 metoprolol since bp is regulated. STay hydrated make sure drinking 40-50oz of water a day. Get labs to check  electrolytes. Will call with results. Gave handout for epley maneuvers can do up to three times a day until dizziness resolved. Gave antivert to take for dizziness but warned that sometimes causes sedation. Take one and try before taking them at work or when you are going to be driving. Call if dizziness worsens or does not resolved in next week.

## 2012-02-14 NOTE — Patient Instructions (Addendum)
Continue to stay hydrated 40-50 oz of water a day. Get labs. Try these exercises up to three times a day. Anivert to take up to three times a day for dizziness but take one first to make sure it doesn't sedate you too much.   Benign Positional Vertigo Vertigo means you feel like you or your surroundings are moving when they are not. Benign positional vertigo is the most common form of vertigo. Benign means that the cause of your condition is not serious. Benign positional vertigo is more common in older adults. CAUSES  Benign positional vertigo is the result of an upset in the labyrinth system. This is an area in the middle ear that helps control your balance. This may be caused by a viral infection, head injury, or repetitive motion. However, often no specific cause is found. SYMPTOMS  Symptoms of benign positional vertigo occur when you move your head or eyes in different directions. Some of the symptoms may include:  Loss of balance and falls.   Vomiting.   Blurred vision.   Dizziness.   Nausea.   Involuntary eye movements (nystagmus).  DIAGNOSIS  Benign positional vertigo is usually diagnosed by physical exam. If the specific cause of your benign positional vertigo is unknown, your caregiver may perform imaging tests, such as magnetic resonance imaging (MRI) or computed tomography (CT). TREATMENT  Your caregiver may recommend movements or procedures to correct the benign positional vertigo. Medicines such as meclizine, benzodiazepines, and medicines for nausea may be used to treat your symptoms. In rare cases, if your symptoms are caused by certain conditions that affect the inner ear, you may need surgery. HOME CARE INSTRUCTIONS   Follow your caregiver's instructions.   Move slowly. Do not make sudden body or head movements.   Avoid driving.   Avoid operating heavy machinery.   Avoid performing any tasks that would be dangerous to you or others during a vertigo episode.    Drink enough fluids to keep your urine clear or pale yellow.  SEEK IMMEDIATE MEDICAL CARE IF:   You develop problems with walking, weakness, numbness, or using your arms, hands, or legs.   You have difficulty speaking.   You develop severe headaches.   Your nausea or vomiting continues or gets worse.   You develop visual changes.   Your family or friends notice any behavioral changes.   Your condition gets worse.   You have a fever.   You develop a stiff neck or sensitivity to light.  MAKE SURE YOU:   Understand these instructions.   Will watch your condition.   Will get help right away if you are not doing well or get worse.  Document Released: 06/26/2006 Document Revised: 09/07/2011 Document Reviewed: 06/08/2011 [facility] Patient Information 2012 [place], [place].

## 2012-02-15 LAB — ELECTROLYTE PANEL
Potassium: 4.5 mEq/L (ref 3.5–5.3)
Sodium: 141 mEq/L (ref 135–145)

## 2012-02-15 MED ORDER — MECLIZINE HCL 25 MG PO TABS: 25.0000 mg | ORAL_TABLET | Freq: Three times a day (TID) | ORAL | Status: AC | PRN

## 2012-02-15 NOTE — Progress Notes (Signed)
Quick Note:  Call patient with results. Let them know all labs are within normal limits. ______ 

## 2012-02-19 ENCOUNTER — Telehealth: Payer: Self-pay | Admitting: *Deleted

## 2012-02-19 ENCOUNTER — Ambulatory Visit: Payer: 59 | Admitting: Family Medicine

## 2012-02-19 NOTE — Telephone Encounter (Signed)
Decrease statin to 40mg  it can cause dizziness. Let's see if this helps any.

## 2012-02-19 NOTE — Telephone Encounter (Signed)
Pt states he took Anivert till Sunday b/c it made him so drowsy and he is not taking Flexeril. States that his cholesterol med was increased. His ears are not itchy or bothering him and he states he drinks 6-8 bottles of water a day. Still doing excercises. Please advise.

## 2012-02-19 NOTE — Telephone Encounter (Signed)
Has the patient taken the Anivert prescribed? Any new symptoms? Are your ears itchy or bothering you?  1. Benign positional vertigo can take time to resolve even up to 3-6 weeks.  2. How often are you taking flexeril? It may be causing vertigo. 3. Stay hydrated. 4. Continue to do epley maneuvers.

## 2012-02-19 NOTE — Telephone Encounter (Signed)
Pt states that he is still having the dizzy spells. States they aren't getting any better. Please advise.

## 2012-02-19 NOTE — Telephone Encounter (Signed)
Pt informed and will call back on Friday to see if the dizziness is any better.

## 2012-02-27 ENCOUNTER — Ambulatory Visit (INDEPENDENT_AMBULATORY_CARE_PROVIDER_SITE_OTHER): Payer: BC Managed Care – PPO | Admitting: Family Medicine

## 2012-02-27 ENCOUNTER — Other Ambulatory Visit: Payer: Self-pay | Admitting: Family Medicine

## 2012-02-27 ENCOUNTER — Encounter: Payer: Self-pay | Admitting: Family Medicine

## 2012-02-27 VITALS — BP 140/94 | HR 84 | Ht 73.0 in | Wt 213.0 lb

## 2012-02-27 DIAGNOSIS — H8110 Benign paroxysmal vertigo, unspecified ear: Secondary | ICD-10-CM

## 2012-02-27 DIAGNOSIS — D239 Other benign neoplasm of skin, unspecified: Secondary | ICD-10-CM

## 2012-02-27 DIAGNOSIS — IMO0002 Reserved for concepts with insufficient information to code with codable children: Secondary | ICD-10-CM

## 2012-02-27 DIAGNOSIS — D229 Melanocytic nevi, unspecified: Secondary | ICD-10-CM

## 2012-02-27 DIAGNOSIS — L219 Seborrheic dermatitis, unspecified: Secondary | ICD-10-CM

## 2012-02-27 MED ORDER — BETAMETHASONE VALERATE 0.12 % EX FOAM: 1.0000 "application " | Freq: Two times a day (BID) | CUTANEOUS | Status: DC

## 2012-02-27 NOTE — Progress Notes (Signed)
Subjective:    Patient ID: Raymond Mitchell, male    DOB: October 12, 1955, 56 y.o.   MRN: [id number]  HPI Atypical  Mole on left temple.  Has been there for years. Hasn't really changed much. I had asked him to come in for a bx.  not painful or bleeding. History of squamous cell carcinoma on the leg.  Dizziness.   - Dx with BPV. Says had dizziness daily for about 9 days. Now happening occasionally.  Says had gone up on the pravastatin when this happened so went back down. He felt a PA who felt that he had benign positional vertigo. She prescribed meclizine. She also gave him a copy of the Epley maneuvers and he has been doing them some. The meclizine really sedated him so stopped it today.       Review of Systems     Objective:   Physical Exam  Constitutional: He is oriented to person, place, and time. He appears well-developed and well-nourished.  HENT:  Head: Normocephalic and atraumatic.  Right Ear: External ear normal.  Left Ear: External ear normal.  Nose: Nose normal.  Mouth/Throat: Oropharynx is clear and moist.       TMs and canals are clear.   Eyes: Conjunctivae and EOM are normal. Pupils are equal, round, and reactive to light.  Neck: Neck supple. No thyromegaly present.  Cardiovascular: Normal rate and normal heart sounds.   Pulmonary/Chest: Effort normal and breath sounds normal.  Lymphadenopathy:    He has no cervical adenopathy.  Neurological: He is alert and oriented to person, place, and time. No cranial nerve deficit.       Positive Dix-Hallpike maneuver to the left.  Gait is normal.   Skin: Skin is warm and dry.       0.4 x 0.5 cm hyperpigemented atypical mole.   Psychiatric: He has a normal mood and affect.          Assessment & Plan:  Atypical nevus - patient tolerated bx well.    BPV - he has been improving slightly. + dix-hallpike to the left. Stop the meclizine, since helping minimally and causing excess sedation.  Go back up on cholesterol and BP  meds. Continue eply manuvers.  We did discuss physical therapy as an option and he does not take his insurance will cover this time. He would like to continue with home blood maneuvers and see if he continues to get better over the next 2-3 weeks. Call us back if he is not improving.  Seborrheic dermatitis-he's had some improvement with topical ketoconazole shampoo. He says sometimes that he just has a lot of dandruff. Still very itchy at times. He thinks using baby oil helps some. We discussed avoiding taking very hot showers avoiding using think the care try her sugars tend to drop a scalpel even more. We will also try a topical foam steroid, Luxiq and see if this helps.  Shave Biopsy Procedure Note  Pre-operative Diagnosis: Suspicious lesion, atypical nevus  Post-operative Diagnosis: same  Locations:left Temple  Indications: Atypical, hypopigmented. Looks very different from his other moles.  Anesthesia: Lidocaine 1% with epinephrine without added sodium bicarbonate  Procedure Details  History of allergy to iodine: no  Patient informed of the risks (including bleeding and infection) and benefits of the  procedure and Verbal informed consent obtained.  The lesion and surrounding area were given a sterile prep using betadyne and draped in the usual sterile fashion. A scalpel was used to shave an area  of skin approximately 0.5cm by 0.6 cm.  Hemostasis achieved with alumuninum chloride. Antibiotic ointment and a sterile dressing applied.  The specimen was sent for pathologic examination. The patient tolerated the procedure well.  EBL: 0 ml  Findings:  await pathology results  Condition: Stable  Complications: none.  Plan: 1. Instructed to keep the wound dry and covered for 24-48h and clean thereafter. 2. Warning signs of infection were reviewed.   3. Recommended that the patient use NSAID and OTC acetaminophen as needed for pain.  4. Return prn.

## 2012-02-27 NOTE — Patient Instructions (Signed)
Apply vaseline daily to the wound.

## 2012-03-01 ENCOUNTER — Telehealth: Payer: Self-pay | Admitting: *Deleted

## 2012-03-01 NOTE — Telephone Encounter (Signed)
Pt wanted you to know that medication rx'ed for his scalp is working well and he has not had vertigo in two days

## 2012-08-05 ENCOUNTER — Other Ambulatory Visit: Payer: Self-pay | Admitting: *Deleted

## 2012-08-05 ENCOUNTER — Other Ambulatory Visit: Payer: Self-pay | Admitting: Family Medicine

## 2012-08-05 MED ORDER — PRAVASTATIN SODIUM 40 MG PO TABS: 40.0000 mg | ORAL_TABLET | Freq: Every day | ORAL | Status: DC

## 2012-08-06 ENCOUNTER — Other Ambulatory Visit: Payer: Self-pay | Admitting: Family Medicine

## 2012-08-21 ENCOUNTER — Other Ambulatory Visit: Payer: Self-pay | Admitting: Family Medicine

## 2012-08-22 ENCOUNTER — Ambulatory Visit (INDEPENDENT_AMBULATORY_CARE_PROVIDER_SITE_OTHER): Payer: BC Managed Care – PPO | Admitting: Family Medicine

## 2012-08-22 DIAGNOSIS — Z23 Encounter for immunization: Secondary | ICD-10-CM

## 2012-08-22 NOTE — Progress Notes (Signed)
  Subjective:    Patient ID: Raymond Mitchell, male    DOB: 1956/04/23, 56 y.o.   MRN: [id number]  HPI    Review of Systems     Objective:   Physical Exam        Assessment & Plan:  Flu vac given

## 2012-08-28 ENCOUNTER — Telehealth: Payer: Self-pay | Admitting: *Deleted

## 2012-08-28 DIAGNOSIS — E785 Hyperlipidemia, unspecified: Secondary | ICD-10-CM

## 2012-08-28 NOTE — Telephone Encounter (Signed)
Lab router sent to lab downstairs

## 2012-09-02 LAB — LIPID PANEL
Cholesterol: 168 mg/dL (ref 0–200)
HDL: 42 mg/dL (ref >39)
Total CHOL/HDL Ratio: 4 Ratio
Triglycerides: 150 mg/dL — ABNORMAL HIGH (ref <150)

## 2012-09-03 LAB — COMPLETE METABOLIC PANEL WITH GFR
Alkaline Phosphatase: 54 U/L (ref 39–117)
BUN: 22 mg/dL (ref 6–23)
Creat: 1.11 mg/dL (ref 0.50–1.35)
GFR, Est Non African American: 74 mL/min
Glucose, Bld: 105 mg/dL — ABNORMAL HIGH (ref 70–99)
Sodium: 144 mEq/L (ref 135–145)
Total Bilirubin: 0.6 mg/dL (ref 0.3–1.2)

## 2012-09-05 ENCOUNTER — Encounter: Payer: Self-pay | Admitting: Family Medicine

## 2012-09-05 ENCOUNTER — Ambulatory Visit (INDEPENDENT_AMBULATORY_CARE_PROVIDER_SITE_OTHER): Payer: BC Managed Care – PPO | Admitting: Family Medicine

## 2012-09-05 VITALS — BP 131/83 | HR 80 | Ht 74.0 in | Wt 210.0 lb

## 2012-09-05 DIAGNOSIS — I10 Essential (primary) hypertension: Secondary | ICD-10-CM

## 2012-09-05 DIAGNOSIS — M25551 Pain in right hip: Secondary | ICD-10-CM

## 2012-09-05 DIAGNOSIS — L219 Seborrheic dermatitis, unspecified: Secondary | ICD-10-CM | POA: Insufficient documentation

## 2012-09-05 DIAGNOSIS — M25559 Pain in unspecified hip: Secondary | ICD-10-CM

## 2012-09-05 DIAGNOSIS — M459 Ankylosing spondylitis of unspecified sites in spine: Secondary | ICD-10-CM

## 2012-09-05 MED ORDER — KETOCONAZOLE 2 % EX SHAM: MEDICATED_SHAMPOO | CUTANEOUS | Status: DC

## 2012-09-05 NOTE — Progress Notes (Signed)
  Subjective:    Patient ID: Raymond Mitchell, male    DOB: 1955-11-15, 56 y.o.   MRN: [id number]  HPI HTN -  Pt denies chest pain, SOB, dizziness, or heart palpitations.  Taking meds as directed w/o problems.  Denies medication side effects.  Has been cutting back on carbs  Has been having right hip pain and bilateral knee pain. Has been taking 2 Aleve in the AM and that does help.  Hx of AK.  He is very active and exercise and doesn't have a lot of problems with his back.  Brother with AK as well.  Hx of iritis. Hx of bilateral hip pain.   Wants me to see spots on his face/head.     Review of Systems     Objective:   Physical Exam  Constitutional: He is oriented to person, place, and time. He appears well-developed and well-nourished.  HENT:  Head: Normocephalic and atraumatic.  Cardiovascular: Normal rate, regular rhythm and normal heart sounds.   Pulmonary/Chest: Effort normal and breath sounds normal.  Neurological: He is alert and oriented to person, place, and time.  Skin: Skin is warm and dry.       Scalp with erythematous scaling plaque lesions.  Psychiatric: He has a normal mood and affect. His behavior is normal.          Assessment & Plan:  HTN - well controlled.  F/U in 6 months.  Ankylosing  Spondylitis - given that he is doing very well right now especially with his regular exercise routine as I think it might benefit him to see a rheumatologist and we discussed this. I promised him I would not him some additional information about the most of the treatments for ankylosing spondylitis.  HIp Pain - at this point time he doesn't want to do any further workup by recommended if he feels it's getting worse or more bothersome to start with a plain film x-ray to see how advanced the arthritis is in his hip.  Hyperlipidemia-well-controlled on current regimen.

## 2012-09-05 NOTE — Assessment & Plan Note (Signed)
Will refill the Nizoral shampoo. Continue with a topical steroid. Also try to keep the scalp was moisturize as possible especially in the wintertime.

## 2012-10-12 ENCOUNTER — Other Ambulatory Visit: Payer: Self-pay | Admitting: Family Medicine

## 2012-10-18 ENCOUNTER — Encounter: Payer: Self-pay | Admitting: Family Medicine

## 2012-10-18 ENCOUNTER — Ambulatory Visit (INDEPENDENT_AMBULATORY_CARE_PROVIDER_SITE_OTHER): Payer: BC Managed Care – PPO | Admitting: Family Medicine

## 2012-10-18 VITALS — BP 142/90 | HR 67 | Temp 97.9°F | Resp 18 | Wt 210.0 lb

## 2012-10-18 DIAGNOSIS — L219 Seborrheic dermatitis, unspecified: Secondary | ICD-10-CM

## 2012-10-18 DIAGNOSIS — J029 Acute pharyngitis, unspecified: Secondary | ICD-10-CM

## 2012-10-18 DIAGNOSIS — J329 Chronic sinusitis, unspecified: Secondary | ICD-10-CM

## 2012-10-18 MED ORDER — MUPIROCIN 2 % EX OINT: TOPICAL_OINTMENT | Freq: Two times a day (BID) | CUTANEOUS | Status: DC

## 2012-10-18 NOTE — Patient Instructions (Signed)

## 2012-10-18 NOTE — Progress Notes (Signed)
  Subjective:    Patient ID: Raymond Mitchell, male    DOB: 1956-05-21, 57 y.o.   MRN: [id number]  HPI 2 days of scatchy throat.  Some nasal congestion and post nasal drip. He feels his Cough in his throat, instead of in his chest.  Maybe some fever.  Using Tyelnol and cordidan D too.  No Gi sxs. No HA.  Former smoker. Unknown sick contacts. He did go home from work early yesterday and has not been in today.  Seborrheic dermatitis-he's been using a steroid foam as well as the antifungal shampoo. He says the itching has improved greatly. The dry skin has as well. There he still has a few bumps on the scalp near both years. He says they really haven't changed or gotten smaller and wanted to take a look at them again today.  Review of Systems     Objective:   Physical Exam  Constitutional: He is oriented to person, place, and time. He appears well-developed and well-nourished.  HENT:  Head: Normocephalic and atraumatic.  Right Ear: External ear normal.  Left Ear: External ear normal.  Nose: Nose normal.  Mouth/Throat: Oropharynx is clear and moist.       TMs and canals are clear.   Eyes: Conjunctivae normal and EOM are normal. Pupils are equal, round, and reactive to light.  Neck: Neck supple. No thyromegaly present.  Cardiovascular: Normal rate and normal heart sounds.   Pulmonary/Chest: Effort normal and breath sounds normal.  Lymphadenopathy:    He has no cervical adenopathy.  Neurological: He is alert and oriented to person, place, and time.  Skin: Skin is warm and dry.       He still has some overall dryness to the scalp. He does have some small oval-shaped approximately 1 cm patches near both years on the scalp that almost look like psoriasis but with excoriations. A few of the lesions are more papular.  Psychiatric: He has a normal mood and affect.          Assessment & Plan:  Sinusitis - likely viral at this point because only had symptoms for about 48 hours. Reminded him  of symptomatic care. Call if he feels that she's not better in one week or if he feels like he is getting worse or spikes a temperature or fever. Handout given. Patient was very hesitant to leave without some type of antibiotic and I try to do my best to reassure him that this is more viral.  Seborrheic dermatitis-he does have a couple of patches that look consistent him is with psoriasis. The they do tend to look irritated with some excoriations. We will try topical Bactroban ointment, continue the steroid foam in the ketoconazole shampoo. If the lesions are not improving over the next week then please call me back in our for him to dermatology for further evaluation. They do not look like a type of cancer and tried to reassure him.

## 2012-10-21 ENCOUNTER — Telehealth: Payer: Self-pay | Admitting: *Deleted

## 2012-10-21 MED ORDER — AMOXICILLIN-POT CLAVULANATE 875-125 MG PO TABS: 1.0000 | ORAL_TABLET | Freq: Two times a day (BID) | ORAL | Status: DC

## 2012-10-21 MED ORDER — HYDROCODONE-HOMATROPINE 5-1.5 MG/5ML PO SYRP: 5.0000 mL | ORAL_SOLUTION | Freq: Every evening | ORAL | Status: DC | PRN

## 2012-10-21 NOTE — Telephone Encounter (Signed)
Pt calls and states he is not feeling any better and cough is horrible. Was told to call back if no better and would give antibiotic. Pt also request a cough med be sent to pharmacy

## 2012-10-21 NOTE — Telephone Encounter (Signed)
rx sent for antibiotic and cough med.  Still could be viral though.

## 2012-10-22 NOTE — Telephone Encounter (Signed)
LMOM that med sent to pharmacy

## 2012-11-13 ENCOUNTER — Other Ambulatory Visit: Payer: Self-pay | Admitting: Family Medicine

## 2013-02-04 ENCOUNTER — Other Ambulatory Visit: Payer: Self-pay | Admitting: Family Medicine

## 2013-02-21 ENCOUNTER — Other Ambulatory Visit: Payer: Self-pay | Admitting: Family Medicine

## 2013-02-28 ENCOUNTER — Other Ambulatory Visit: Payer: Self-pay | Admitting: Family Medicine

## 2013-03-06 ENCOUNTER — Ambulatory Visit (INDEPENDENT_AMBULATORY_CARE_PROVIDER_SITE_OTHER): Payer: BC Managed Care – PPO | Admitting: Family Medicine

## 2013-03-06 ENCOUNTER — Encounter: Payer: Self-pay | Admitting: Family Medicine

## 2013-03-06 ENCOUNTER — Other Ambulatory Visit: Payer: Self-pay | Admitting: Family Medicine

## 2013-03-06 VITALS — BP 153/95 | HR 65 | Ht 74.0 in | Wt 209.0 lb

## 2013-03-06 DIAGNOSIS — R42 Dizziness and giddiness: Secondary | ICD-10-CM

## 2013-03-06 DIAGNOSIS — M5090 Cervical disc disorder, unspecified, unspecified cervical region: Secondary | ICD-10-CM

## 2013-03-06 DIAGNOSIS — I10 Essential (primary) hypertension: Secondary | ICD-10-CM

## 2013-03-06 LAB — COMPLETE METABOLIC PANEL WITH GFR
Albumin: 4.5 g/dL (ref 3.5–5.2)
Alkaline Phosphatase: 62 U/L (ref 39–117)
BUN: 19 mg/dL (ref 6–23)
CO2: 27 mEq/L (ref 19–32)
GFR, Est African American: 81 mL/min
GFR, Est Non African American: 70 mL/min
Glucose, Bld: 109 mg/dL — ABNORMAL HIGH (ref 70–99)
Potassium: 4.2 mEq/L (ref 3.5–5.3)
Total Bilirubin: 0.5 mg/dL (ref 0.3–1.2)

## 2013-03-06 LAB — CBC WITH DIFFERENTIAL/PLATELET
Basophils Absolute: 0.1 10*3/uL (ref 0.0–0.1)
Basophils Relative: 1 % (ref 0–1)
Eosinophils Relative: 2 % (ref 0–5)
HCT: 44.4 % (ref 39.0–52.0)
MCHC: 34.9 g/dL (ref 30.0–36.0)
MCV: 95.9 fL (ref 78.0–100.0)
Monocytes Absolute: 0.8 10*3/uL (ref 0.1–1.0)
RDW: 13.2 % (ref 11.5–15.5)

## 2013-03-06 MED ORDER — PREDNISONE 20 MG PO TABS: ORAL_TABLET | ORAL | Status: DC

## 2013-03-06 MED ORDER — METOPROLOL SUCCINATE ER 100 MG PO TB24: ORAL_TABLET | ORAL | Status: DC

## 2013-03-06 NOTE — Progress Notes (Signed)
Subjective:    Patient ID: Raymond Mitchell, male    DOB: 02-22-1956, 57 y.o.   MRN: [id number]  HPI Vertigo  - had episode in Feb and took meclizine.  Then again in April that last a couple of days.  Sys this feels a little different. Started Feeling lightheaded and nauseated about 3 weeks ago. It has been on and off pt stated.  For the past wk these episodes have been lasting all day. Still smoking.  Says smoking 1 pack per 2 weeks. Smokes when gets really stressed Smoking helped his sxs. Says lightheadedness can last hours and usually gets better in late afternoon or early evening.  Mild ear pain and pressure on the right back in March but that has improved.  No recent hearing changing.  No vomiting or bowel changes.   HTN-  Pt denies chest pain, SOB or heart palpitations.  Taking meds as directed w/o problems.  Denies medication side effects.   Feels like pinched nerve in his neck is back. Had injection years ago and was really healpful. Using hydrocodone for this and his shoulder.   No vison changes. No change in BMs.   Review of Systems BP 153/95  Pulse 65  Ht 6\' 2"  (1.88 m)  Wt 209 lb (94.802 kg)  BMI 26.82 kg/m2    Allergies  Allergen Reactions  . Lisinopril Other (See Comments)    Lightheadedness and constipation    Past Medical History  Diagnosis Date  . Ankylosing spondylitis   . Varicose veins   . Pes planus   . Tobacco user   . BPH (benign prostatic hyperplasia)   . Diverticulosis   . Fatty liver     Past Surgical History  Procedure Laterality Date  . Inguinal hernia repair  1994  . Rtc surgery  2005  . C spine esi      Dr Penelope Galas    History   Social History  . Marital Status: Married    Spouse Name: N/A    Number of Children: N/A  . Years of Education: N/A   Occupational History  . Not on file.   Social History Main Topics  . Smoking status: Current Some Day Smoker -- 1.00 packs/day for 25 years    Last Attempt to Quit: 12/28/2011  . Smokeless  tobacco: Not on file  . Alcohol Use: 10.5 oz/week    21 drink(s) per week     Comment: 3 drinks per day  . Drug Use: No  . Sexually Active: Not on file   Other Topics Concern  . Not on file   Social History Narrative  . No narrative on file    Family History  Problem Relation Age of Onset  . Hyperlipidemia Mother   . Hypertension Mother   . Diabetes Mother   . Hyperlipidemia Father   . Hypertension Father   . Other Father     ank spondy  . Other Brother     Brain tumor, CVA  . Other Brother     ank spondy    Outpatient Encounter Prescriptions as of 03/06/2013  Medication Sig Dispense Refill  . aspirin 81 MG tablet Take 81 mg by mouth daily.       . Calcium Carbonate-Vitamin D (CALTRATE 600+D) 600-400 MG-UNIT per tablet Take 1 tablet by mouth daily.        . cyclobenzaprine (FLEXERIL) 10 MG tablet Take 10 mg by mouth daily.        . fish  oil-omega-3 fatty acids 1000 MG capsule Take by mouth. 2 capsules po bid        . ketoconazole (NIZORAL) 2 % shampoo DAily for one week, then 2-3 times a week for maintenance.  120 mL  4  . metoprolol succinate (TOPROL-XL) 100 MG 24 hr tablet TAKE 1 TABLET BY MOUTH EVERY DAY  90 tablet  0  . multivitamin (THERAGRAN) per tablet Take 0.5 tablets by mouth 2 (two) times daily.        . naproxen (NAPROSYN) 500 MG tablet Take 500 mg by mouth 2 (two) times daily with a meal.        . Potassium 99 MG TABS Take 2 tablets by mouth daily.        . pravastatin (PRAVACHOL) 80 MG tablet TAKE 1 TABLET (80 MG TOTAL) BY MOUTH DAILY.  30 tablet  5  . Tamsulosin HCl (FLOMAX) 0.4 MG CAPS TAKE 1 CAP BY MOUTH ONCE DAILY  30 capsule  6  . vitamin B-12 (CYANOCOBALAMIN) 1000 MCG tablet Take 1,000 mcg by mouth daily.        . [DISCONTINUED] metoprolol succinate (TOPROL-XL) 50 MG 24 hr tablet TAKE 1 TABLET BY MOUTH EVERY DAY  90 tablet  0  . predniSONE (DELTASONE) 20 MG tablet 2 tabs QD x 5 days, then 1 QD x 5 days  15 tablet  0  . [DISCONTINUED]  amoxicillin-clavulanate (AUGMENTIN) 875-125 MG per tablet Take 1 tablet by mouth 2 (two) times daily.  20 tablet  0  . [DISCONTINUED] Betamethasone Valerate 0.12 % foam APPLY 1 APPLICATION TOPICALLY 2 (TWO) TIMES DAILY.  100 g  2  . [DISCONTINUED] HYDROcodone-homatropine (HYCODAN) 5-1.5 MG/5ML syrup Take 5 mLs by mouth at bedtime as needed for cough.  180 mL  0  . [DISCONTINUED] mometasone (NASONEX) 50 MCG/ACT nasal spray Place 2 sprays into the nose daily.  17 g  0  . [DISCONTINUED] mupirocin ointment (BACTROBAN) 2 % Apply topically 2 (two) times daily.  15 g  0   No facility-administered encounter medications on file as of 03/06/2013.          Objective:   Physical Exam  Constitutional: He is oriented to person, place, and time. He appears well-developed and well-nourished.  HENT:  Head: Normocephalic and atraumatic.  Cardiovascular: Normal rate, regular rhythm and normal heart sounds.   Pulmonary/Chest: Effort normal and breath sounds normal.  Neurological: He is alert and oriented to person, place, and time. He has normal reflexes. No cranial nerve deficit.  Neg Dicks=Hallpike  Skin: Skin is warm and dry.  Psychiatric: He has a normal mood and affect. His behavior is normal.          Assessment & Plan:  Dizziness - Unclear etiology. Neg Diks-hallpike.  Will check for anemia, thyroid, infection etc.  Work on getting in with Neurologist for your neck.  Can try oral steroids. No CP or SOB. Ear exam is normal today.  Hypertension-will increase metoprolol to 100mg  daily.  F/U in one mo for BP check.    Of cervical neck pain-has a history of herniated disc with nerve compression left but now is having symptoms on his right. His last MRI of his neck was back in 2005. Encouraged him to see his neurologist said that they can evaluate him and see if he would benefit from epidural injections which he has responded to really well in the past. He typically sees Dr. Thelma Barge. We will refer him  back. We will likely repeat  his MRI. I did give him a burst of prednisone for 10 days to see if this helps provide some pain relief in the interim.

## 2013-03-10 ENCOUNTER — Telehealth: Payer: Self-pay | Admitting: *Deleted

## 2013-03-10 DIAGNOSIS — R748 Abnormal levels of other serum enzymes: Secondary | ICD-10-CM

## 2013-03-10 LAB — HEPATITIS PANEL, ACUTE
HCV Ab: NEGATIVE
Hepatitis B Surface Ag: NEGATIVE

## 2013-03-10 NOTE — Telephone Encounter (Signed)
Orders for hep panel.Raymond Mitchell [place]

## 2013-03-11 ENCOUNTER — Other Ambulatory Visit: Payer: Self-pay | Admitting: *Deleted

## 2013-03-11 DIAGNOSIS — R748 Abnormal levels of other serum enzymes: Secondary | ICD-10-CM

## 2013-03-14 LAB — HEPATIC FUNCTION PANEL
ALT: 35 U/L (ref 0–53)
Albumin: 4.4 g/dL (ref 3.5–5.2)
Alkaline Phosphatase: 58 U/L (ref 39–117)
Total Protein: 6.2 g/dL (ref 6.0–8.3)

## 2013-04-01 ENCOUNTER — Telehealth: Payer: Self-pay | Admitting: *Deleted

## 2013-04-01 MED ORDER — PREDNISONE 20 MG PO TABS: ORAL_TABLET | ORAL | Status: DC

## 2013-04-01 NOTE — Telephone Encounter (Signed)
Pt called today & is asking for another refill on prednisone for his neck.  He is going on vacation tomorrow night & can feel a "twinge" of pain starting back up.  He states that he will take it with him on vacation but only take it if the pain gets worse. Please advise

## 2013-04-01 NOTE — Telephone Encounter (Signed)
Ok to refill 

## 2013-04-01 NOTE — Telephone Encounter (Signed)
Called and informed pt .Raymond Mitchell Raymond Mitchell  

## 2013-05-21 ENCOUNTER — Other Ambulatory Visit: Payer: Self-pay | Admitting: Family Medicine

## 2013-05-23 ENCOUNTER — Other Ambulatory Visit: Payer: Self-pay | Admitting: *Deleted

## 2013-05-23 MED ORDER — METOPROLOL SUCCINATE ER 100 MG PO TB24: ORAL_TABLET | ORAL | Status: DC

## 2013-07-02 ENCOUNTER — Ambulatory Visit (INDEPENDENT_AMBULATORY_CARE_PROVIDER_SITE_OTHER): Payer: BC Managed Care – PPO | Admitting: Family Medicine

## 2013-07-02 DIAGNOSIS — Z23 Encounter for immunization: Secondary | ICD-10-CM

## 2013-07-02 NOTE — Progress Notes (Signed)
Patient was  In office and was given flu vaccine in left deltoid. No reactions was noted. Rhonda Cunningham,CMA

## 2013-08-26 ENCOUNTER — Other Ambulatory Visit: Payer: Self-pay | Admitting: Family Medicine

## 2013-10-07 ENCOUNTER — Other Ambulatory Visit: Payer: Self-pay | Admitting: Family Medicine

## 2013-10-07 ENCOUNTER — Telehealth: Payer: Self-pay | Admitting: *Deleted

## 2013-10-07 MED ORDER — MECLIZINE HCL 25 MG PO TABS: 25.0000 mg | ORAL_TABLET | Freq: Three times a day (TID) | ORAL | Status: DC | PRN

## 2013-10-07 NOTE — Telephone Encounter (Signed)
Pt states he is having episode of Vertigo angain and would like to know if you can call him something in to the pharmacy. States he does need to keep working unless he absolutely needs to come in.  Oscar La, LPN

## 2013-10-07 NOTE — Telephone Encounter (Signed)
Pt given 15 tabs will need an appt for future refills. Pt informed he will call back to schedule an appt.Elouise Munroe'

## 2013-10-07 NOTE — Telephone Encounter (Signed)
Send over Rx for vertigo. Come in if not better in one week.

## 2013-10-08 ENCOUNTER — Ambulatory Visit (INDEPENDENT_AMBULATORY_CARE_PROVIDER_SITE_OTHER): Payer: BC Managed Care – PPO | Admitting: Family Medicine

## 2013-10-08 ENCOUNTER — Encounter: Payer: Self-pay | Admitting: Family Medicine

## 2013-10-08 VITALS — BP 140/92 | HR 76 | Temp 97.7°F | Ht 72.0 in | Wt 210.0 lb

## 2013-10-08 DIAGNOSIS — F17200 Nicotine dependence, unspecified, uncomplicated: Secondary | ICD-10-CM

## 2013-10-08 DIAGNOSIS — I10 Essential (primary) hypertension: Secondary | ICD-10-CM

## 2013-10-08 DIAGNOSIS — J019 Acute sinusitis, unspecified: Secondary | ICD-10-CM

## 2013-10-08 DIAGNOSIS — Z720 Tobacco use: Secondary | ICD-10-CM

## 2013-10-08 MED ORDER — METOPROLOL SUCCINATE ER 100 MG PO TB24: ORAL_TABLET | ORAL | Status: DC

## 2013-10-08 MED ORDER — PRAVASTATIN SODIUM 80 MG PO TABS
ORAL_TABLET | ORAL | Status: DC
Start: 2013-10-08 — End: 2014-02-27

## 2013-10-08 MED ORDER — AMOXICILLIN-POT CLAVULANATE 875-125 MG PO TABS: 1.0000 | ORAL_TABLET | Freq: Two times a day (BID) | ORAL | Status: DC

## 2013-10-08 NOTE — Telephone Encounter (Signed)
LMOM with response.  Lillyanna Glandon, LPN  

## 2013-10-08 NOTE — Progress Notes (Signed)
   Subjective:    Patient ID: Raymond Mitchell, male    DOB: [birth year], 57 y.o.   MRN: [id number]  HPI 5 days of severe sinus congestion. Worse the last 2 days. Has had vertigo for the last 2 weeks on and off. We called in meclizine for him yesterday. No fever, chills, or sweats.  Mild ST last night.  No ear pain.  No OTC cold meds. Pain on the left side of the face and radiating aroung the left ear.   Hypertension- Pt denies chest pain, SOB, dizziness, or heart palpitations.  Taking meds as directed w/o problems.  Denies medication side effects.  Ran out of BP meds about 2 days ago.    tob abuse - started smoking. His mother was on hospice and passed away over teh Holiday. He stared smoking again.   Review of Systems     Objective:   Physical Exam  Constitutional: He is oriented to person, place, and time. He appears well-developed and well-nourished.  HENT:  Head: Normocephalic and atraumatic.  Right Ear: External ear normal.  Left Ear: External ear normal.  Nose: Nose normal.  Mouth/Throat: Oropharynx is clear and moist.  TMs and canals are clear.   Eyes: Conjunctivae and EOM are normal. Pupils are equal, round, and reactive to light.  Neck: Neck supple. No thyromegaly present.  Cardiovascular: Normal rate and normal heart sounds.   Pulmonary/Chest: Effort normal and breath sounds normal.  Lymphadenopathy:    He has no cervical adenopathy.  Neurological: He is alert and oriented to person, place, and time.  Skin: Skin is warm and dry.  Psychiatric: He has a normal mood and affect.          Assessment & Plan:  Acute sinusitis-could be viral versus bacterial as she's only had symptoms for 5 days. He started having right facial pain radiating to his right ear about 2 days ago and feels like he's been getting worse. We'll go ahead and treat with Augmentin. She's not significantly better in a week and please call the office back. Okay for symptomatic care. Also encouraged him  to use as she notify her overnight to help keep the secretions thin.    HTN - Uncontrolled.  Restart medication. F/U in 3-4 months to recheck.   tob abuse - Encouraged cessation. Want him to put himself first and work on his health.

## 2013-10-24 ENCOUNTER — Ambulatory Visit (INDEPENDENT_AMBULATORY_CARE_PROVIDER_SITE_OTHER): Payer: BC Managed Care – PPO | Admitting: Physician Assistant

## 2013-10-24 ENCOUNTER — Encounter: Payer: Self-pay | Admitting: Physician Assistant

## 2013-10-24 VITALS — BP 138/83 | HR 83 | Wt 205.0 lb

## 2013-10-24 DIAGNOSIS — N401 Enlarged prostate with lower urinary tract symptoms: Secondary | ICD-10-CM

## 2013-10-24 DIAGNOSIS — K6289 Other specified diseases of anus and rectum: Secondary | ICD-10-CM

## 2013-10-24 DIAGNOSIS — R197 Diarrhea, unspecified: Secondary | ICD-10-CM

## 2013-10-24 DIAGNOSIS — N138 Other obstructive and reflux uropathy: Secondary | ICD-10-CM

## 2013-10-24 MED ORDER — HYDROCORTISONE ACETATE 25 MG RE SUPP: 25.0000 mg | Freq: Two times a day (BID) | RECTAL | Status: DC

## 2013-10-24 NOTE — Progress Notes (Signed)
   Subjective:    Patient ID: Raymond Mitchell, male    DOB: 24-Jun-1956, 58 y.o.   MRN: [id number]  HPI Pt is a 58 yo male who presents to the clinic with rectal pain and diarrhea. 1 week ago pt work up with sharp pain in rectal area and went away. Loose stools started 2-3 times a day while on abx. Stopped abx approximately 5 days ago but loose stools continue. Rectal pain/pressure continues. Bowel movements sometime relieve it but always feels like there is something in his rectum. Last colonoscopy 7 years ago. Denies any blood. No abdominal pain, nausea, vomiting, fever, chills. Rectal pain worse while sitting her toilet. Denies any urinary symptoms. No weak stream or flow.     Review of Systems     Objective:   Physical Exam  Constitutional: He is oriented to person, place, and time. He appears well-developed and well-nourished.  HENT:  Head: Normocephalic and atraumatic.  Abdominal: Soft. Bowel sounds are normal. He exhibits no distension and no mass. There is no tenderness. There is no rebound and no guarding.  Genitourinary:  Hemmocult negative for blood.   DRE- external hemorrhoids presents but not thrombosed. No massed palpated. Prostate felt enlarged but no abnormalities.   Neurological: He is alert and oriented to person, place, and time.          Assessment & Plan:  Diarrhea- just finished abx will check for c.diff and stool culture. Could be loose stools from being on abx. Will continue to monitor. Recommended diet high in fiber.   Rectal pain- hemoccult was negative. 7 years since last colonoscopy will schedule. AUA was 2. Pt taking flomax.  DRE did palpate a larger than normal prostate.will get PSA. Will give anusol for treatment of interal hemorrhoids and see if that helps symptoms. Consider warm sitz baths. Ibuprofen for pain control.

## 2013-10-25 LAB — PSA: PSA: 0.65 ng/mL (ref <=4.00)

## 2013-11-28 LAB — COMPLETE METABOLIC PANEL WITH GFR
ALBUMIN: 4.3 g/dL (ref 3.5–5.2)
ALT: 26 U/L (ref 0–53)
AST: 23 U/L (ref 0–37)
Alkaline Phosphatase: 64 U/L (ref 39–117)
BILIRUBIN TOTAL: 0.6 mg/dL (ref 0.2–1.2)
BUN: 19 mg/dL (ref 6–23)
CALCIUM: 10.1 mg/dL (ref 8.4–10.5)
CHLORIDE: 104 meq/L (ref 96–112)
CO2: 29 meq/L (ref 19–32)
Creat: 1.05 mg/dL (ref 0.50–1.35)
GFR, EST NON AFRICAN AMERICAN: 79 mL/min
GLUCOSE: 101 mg/dL — AB (ref 70–99)
POTASSIUM: 4.4 meq/L (ref 3.5–5.3)
SODIUM: 140 meq/L (ref 135–145)
TOTAL PROTEIN: 6.5 g/dL (ref 6.0–8.3)

## 2013-11-28 LAB — LIPID PANEL
Cholesterol: 113 mg/dL (ref 0–200)
HDL: 31 mg/dL — AB (ref >39)
LDL CALC: 67 mg/dL (ref 0–99)
Total CHOL/HDL Ratio: 3.6 Ratio
Triglycerides: 76 mg/dL (ref <150)
VLDL: 15 mg/dL (ref 0–40)

## 2013-12-02 ENCOUNTER — Encounter: Payer: Self-pay | Admitting: Family Medicine

## 2013-12-02 ENCOUNTER — Ambulatory Visit (INDEPENDENT_AMBULATORY_CARE_PROVIDER_SITE_OTHER): Payer: BC Managed Care – PPO | Admitting: Family Medicine

## 2013-12-02 VITALS — BP 120/74 | HR 77 | Wt 204.0 lb

## 2013-12-02 DIAGNOSIS — R0789 Other chest pain: Secondary | ICD-10-CM

## 2013-12-02 DIAGNOSIS — I10 Essential (primary) hypertension: Secondary | ICD-10-CM

## 2013-12-02 DIAGNOSIS — R1012 Left upper quadrant pain: Secondary | ICD-10-CM

## 2013-12-02 DIAGNOSIS — R071 Chest pain on breathing: Secondary | ICD-10-CM

## 2013-12-02 DIAGNOSIS — R7309 Other abnormal glucose: Secondary | ICD-10-CM

## 2013-12-02 LAB — POCT GLYCOSYLATED HEMOGLOBIN (HGB A1C): Hemoglobin A1C: 5.5

## 2013-12-02 MED ORDER — MECLIZINE HCL 25 MG PO TABS: 25.0000 mg | ORAL_TABLET | Freq: Three times a day (TID) | ORAL | Status: DC | PRN

## 2013-12-02 NOTE — Progress Notes (Signed)
   Subjective:    Patient ID: Raymond Mitchell, male    DOB: 05/21/1956, 58 y.o.   MRN: [id number]  HPI Hypertension- Pt denies chest pain, SOB, dizziness, or heart palpitations.  Taking meds as directed w/o problems.  Denies medication side effects.    Occasional left upper quadrant pain. He says it comes and goes. No known triggers. Seems to get better with activity. He does have a history of constipation he recently had diarrhea and just from antibiotics. He says that the pain started a couple months ago. It usually goes away fairly quickly. He did not get nauseated with it. He has not had any fever. No blood in the stool et Ronney Asters. He has not taken medication for it. He says it almost feels like a pulled muscle.  Abnormal glucose-he had fasting lab work in February and had an abnormal borderline elevated glucose.  Review of Systems     Objective:   Physical Exam  Constitutional: He is oriented to person, place, and time. He appears well-developed and well-nourished.  HENT:  Head: Normocephalic and atraumatic.  Cardiovascular: Normal rate, regular rhythm and normal heart sounds.   Pulmonary/Chest: Effort normal and breath sounds normal.  Abdominal: Soft. Bowel sounds are normal. He exhibits no distension and no mass. There is no tenderness. There is no rebound and no guarding.  Neurological: He is alert and oriented to person, place, and time.  Skin: Skin is warm and dry.  Psychiatric: He has a normal mood and affect. His behavior is normal.          Assessment & Plan:  HTN- well  Controlled.  Continue current regimen. Followup in 6 months.  Left upper quadrant pain-since he is a smoker would like to get a chest x-ray. He is nontender on exam today. No red flags symptoms or other symptoms worrisome for acute pancreatitis et Ronney Asters. Could be muscle strain versus pain the diaphragm itself. It usually is very brief. Certainly could try an anti-inflammatory or Tylenol as needed.  Persistently slightly not.  Abnormal glucose-hemoglobin A1c was within the normal range. Reassurance given.  Lab Results  Component Value Date   HGBA1C 5.5 12/02/2013

## 2013-12-22 ENCOUNTER — Other Ambulatory Visit: Payer: Self-pay | Admitting: Family Medicine

## 2014-01-13 ENCOUNTER — Other Ambulatory Visit: Payer: Self-pay | Admitting: Family Medicine

## 2014-02-27 ENCOUNTER — Other Ambulatory Visit: Payer: Self-pay | Admitting: Family Medicine

## 2014-02-28 ENCOUNTER — Other Ambulatory Visit: Payer: Self-pay | Admitting: Family Medicine

## 2014-03-09 ENCOUNTER — Encounter: Payer: Self-pay | Admitting: Family Medicine

## 2014-03-09 ENCOUNTER — Ambulatory Visit (INDEPENDENT_AMBULATORY_CARE_PROVIDER_SITE_OTHER): Payer: BC Managed Care – PPO | Admitting: Family Medicine

## 2014-03-09 VITALS — BP 126/75 | HR 80 | Wt 200.0 lb

## 2014-03-09 DIAGNOSIS — C4492 Squamous cell carcinoma of skin, unspecified: Secondary | ICD-10-CM

## 2014-03-09 DIAGNOSIS — D229 Melanocytic nevi, unspecified: Secondary | ICD-10-CM

## 2014-03-09 DIAGNOSIS — D239 Other benign neoplasm of skin, unspecified: Secondary | ICD-10-CM

## 2014-03-09 NOTE — Addendum Note (Signed)
Addended by: Teddy Spike on: 03/09/2014 12:13 PM   Modules accepted: Orders

## 2014-03-09 NOTE — Patient Instructions (Addendum)
Can clean the shower with regular soap and water. Do not scrub at the wound. Pat dry and apply a small amount of Neosporin or triple antibiotic ointment. After 3 days of switch to Vaseline. Any signs of excess drainage or infection or spreading redness then please come back in for evaluation of the wound. We will call you in about a week with the results of the biopsy report.

## 2014-03-09 NOTE — Progress Notes (Signed)
   Subjective:    Patient ID: Raymond Mitchell, male    DOB: 07-Jan-1956, 58 y.o.   MRN: [id number]  HPI This lesion on his upper right back about 2-3 weeks ago. He says they're very tender to touch. He feels like it's changed color it now looks more black on the end. He does have a significant history for sun exposure and cigarette smoking.  Review of Systems     Objective:   Physical Exam  He has a hyperkeratotic lesion with a smooth base on the right upper back near the shoulder blade. Approximately 2-3 mm in width. The surface is consistent with a wart like lesion.      Assessment & Plan:  Atypical lesion-removed with shave biopsy today and we'll send for further evaluation. Likely actinic keratosis versus squamous cell skin cancer. We'll call with results once available.  Shave Biopsy Procedure Note  Pre-operative Diagnosis: atypical nevus  Post-operative Diagnosis: normal  Locations:right upper back  Indications: tender, fast growing  Anesthesia: Lidocaine 1% with epinephrine without added sodium bicarbonate  Procedure Details  History of allergy to iodine: no  Patient informed of the risks (including bleeding and infection) and benefits of the  procedure and Verbal informed consent obtained.  The lesion and surrounding area were given a sterile prep using chlorhexidine and draped in the usual sterile fashion. A scalpel was used to shave an area of skin approximately 1cm by 1cm.  Hemostasis achieved with alumuninum chloride. Antibiotic ointment and a sterile dressing applied.  The specimen was sent for pathologic examination. The patient tolerated the procedure well.  EBL: 0 ml  Findings: Await pathology  Condition: Stable  Complications: none.  Plan: 1. Instructed to keep the wound dry and covered for 24-48h and clean thereafter. 2. Warning signs of infection were reviewed.   3. Recommended that the patient use OTC acetaminophen as needed for pain.  4.  Return in prn

## 2014-03-11 ENCOUNTER — Other Ambulatory Visit: Payer: Self-pay | Admitting: Family Medicine

## 2014-03-11 DIAGNOSIS — C4492 Squamous cell carcinoma of skin, unspecified: Secondary | ICD-10-CM

## 2014-04-06 ENCOUNTER — Other Ambulatory Visit: Payer: Self-pay | Admitting: Family Medicine

## 2014-04-21 ENCOUNTER — Other Ambulatory Visit: Payer: Self-pay | Admitting: *Deleted

## 2014-04-21 MED ORDER — METOPROLOL SUCCINATE ER 100 MG PO TB24: ORAL_TABLET | ORAL | Status: DC

## 2014-06-25 ENCOUNTER — Other Ambulatory Visit: Payer: Self-pay | Admitting: Family Medicine

## 2014-07-09 ENCOUNTER — Ambulatory Visit (INDEPENDENT_AMBULATORY_CARE_PROVIDER_SITE_OTHER): Payer: BC Managed Care – PPO | Admitting: Physician Assistant

## 2014-07-09 VITALS — Temp 98.5°F

## 2014-07-09 DIAGNOSIS — Z23 Encounter for immunization: Secondary | ICD-10-CM | POA: Diagnosis not present

## 2014-07-09 NOTE — Progress Notes (Signed)
   Subjective:    Patient ID: Raymond Mitchell, male    DOB: August 06, 1956, 58 y.o.   MRN: [id number]  HPI    Review of Systems     Objective:   Physical Exam        Assessment & Plan:  Flu shot given without complication. Iran Planas PA-c

## 2014-07-20 ENCOUNTER — Other Ambulatory Visit: Payer: Self-pay | Admitting: Family Medicine

## 2014-09-07 ENCOUNTER — Ambulatory Visit (INDEPENDENT_AMBULATORY_CARE_PROVIDER_SITE_OTHER): Payer: BC Managed Care – PPO | Admitting: Family Medicine

## 2014-09-07 ENCOUNTER — Other Ambulatory Visit: Payer: Self-pay | Admitting: Family Medicine

## 2014-09-07 ENCOUNTER — Encounter: Payer: Self-pay | Admitting: Family Medicine

## 2014-09-07 VITALS — BP 122/71 | HR 68 | Temp 98.2°F | Ht 74.0 in | Wt 199.0 lb

## 2014-09-07 DIAGNOSIS — I10 Essential (primary) hypertension: Secondary | ICD-10-CM

## 2014-09-07 DIAGNOSIS — R21 Rash and other nonspecific skin eruption: Secondary | ICD-10-CM

## 2014-09-07 DIAGNOSIS — L218 Other seborrheic dermatitis: Secondary | ICD-10-CM

## 2014-09-07 DIAGNOSIS — L219 Seborrheic dermatitis, unspecified: Secondary | ICD-10-CM

## 2014-09-07 LAB — BASIC METABOLIC PANEL
BUN: 20 mg/dL (ref 6–23)
CHLORIDE: 105 meq/L (ref 96–112)
CO2: 26 mEq/L (ref 19–32)
Calcium: 9.7 mg/dL (ref 8.4–10.5)
Creat: 1.16 mg/dL (ref 0.50–1.35)
GLUCOSE: 105 mg/dL — AB (ref 70–99)
Potassium: 4.3 mEq/L (ref 3.5–5.3)
Sodium: 139 mEq/L (ref 135–145)

## 2014-09-07 MED ORDER — KETOCONAZOLE 2 % EX SHAM: MEDICATED_SHAMPOO | CUTANEOUS | Status: DC

## 2014-09-07 MED ORDER — PRAVASTATIN SODIUM 80 MG PO TABS: ORAL_TABLET | ORAL | Status: DC

## 2014-09-07 MED ORDER — BETAMETHASONE VALERATE 0.12 % EX FOAM: CUTANEOUS | Status: DC

## 2014-09-07 NOTE — Progress Notes (Signed)
   Subjective:    Patient ID: Raymond Mitchell, male    DOB: 12-15-1955, 58 y.o.   MRN: [id number]  HPI Rash- pt reports that he has some dry places on his scalp. he has been using T-Gel and head and shoulders shampoo and conditioner. He says it is very itchey. he has an appt with his dermatologist on 12/17. and a place on his R calf around his ankle.  Thinks the T-gel may help some.    Hypertension- Pt denies chest pain, SOB, dizziness, or heart palpitations.  Taking meds as directed w/o problems.  Denies medication side effects.    Rash on inner right lower leg. He says it's been there for a few weeks. Still little bit itchy. He has not tried any over-the-counter treatments on it.  Review of Systems     Objective:   Physical Exam  Constitutional: He is oriented to person, place, and time. He appears well-developed and well-nourished.  HENT:  Head: Normocephalic and atraumatic.  Neck: Neck supple. No thyromegaly present.  Cardiovascular: Normal rate, regular rhythm and normal heart sounds.   Pulmonary/Chest: Effort normal and breath sounds normal.  Musculoskeletal:  Erythematous scaly rash on the right lower leg. It looks excoriated. No distinct margins.  Lymphadenopathy:    He has no cervical adenopathy.  Neurological: He is alert and oriented to person, place, and time.  Skin: Skin is warm and dry.  Psychiatric: He has a normal mood and affect. His behavior is normal.          Assessment & Plan:  Seborrheic dermatitis of the scalp - recommend treatment with ketoconazole shampoo for the next 2 weeks after that can start to add a topical steroid. We have done this regimen for him in the past and has worked well. Cough not improving or consider follow-up with his dermatologist area  HTN- well controlled. Continue current regimen. Follow-up in 6 months. Next  Rash on right lower leg-KOH skin scraping performed. Will call with results once available.

## 2014-09-08 NOTE — Progress Notes (Signed)
Quick Note:  All labs are normal. ______ 

## 2014-09-09 ENCOUNTER — Other Ambulatory Visit: Payer: Self-pay | Admitting: Family Medicine

## 2014-09-09 LAB — KOH PREP: RESULT - KOH: NONE SEEN

## 2014-09-09 MED ORDER — TRIAMCINOLONE ACETONIDE 0.5 % EX OINT: 1.0000 "application " | TOPICAL_OINTMENT | Freq: Every day | CUTANEOUS | Status: DC

## 2014-10-25 ENCOUNTER — Other Ambulatory Visit: Payer: Self-pay | Admitting: Family Medicine

## 2014-11-11 ENCOUNTER — Telehealth: Payer: Self-pay

## 2014-11-11 ENCOUNTER — Encounter: Payer: Self-pay | Admitting: Physician Assistant

## 2014-11-11 ENCOUNTER — Ambulatory Visit (INDEPENDENT_AMBULATORY_CARE_PROVIDER_SITE_OTHER): Payer: BLUE CROSS/BLUE SHIELD

## 2014-11-11 ENCOUNTER — Other Ambulatory Visit: Payer: Self-pay | Admitting: Physician Assistant

## 2014-11-11 ENCOUNTER — Ambulatory Visit (INDEPENDENT_AMBULATORY_CARE_PROVIDER_SITE_OTHER): Payer: BLUE CROSS/BLUE SHIELD | Admitting: Physician Assistant

## 2014-11-11 VITALS — BP 120/80 | HR 78 | Temp 98.6°F | Resp 16 | Ht 74.0 in | Wt 197.0 lb

## 2014-11-11 DIAGNOSIS — R1031 Right lower quadrant pain: Secondary | ICD-10-CM | POA: Insufficient documentation

## 2014-11-11 DIAGNOSIS — R1032 Left lower quadrant pain: Principal | ICD-10-CM

## 2014-11-11 DIAGNOSIS — E785 Hyperlipidemia, unspecified: Secondary | ICD-10-CM | POA: Diagnosis not present

## 2014-11-11 DIAGNOSIS — K7689 Other specified diseases of liver: Secondary | ICD-10-CM

## 2014-11-11 DIAGNOSIS — R197 Diarrhea, unspecified: Secondary | ICD-10-CM | POA: Diagnosis not present

## 2014-11-11 DIAGNOSIS — K5730 Diverticulosis of large intestine without perforation or abscess without bleeding: Secondary | ICD-10-CM

## 2014-11-11 DIAGNOSIS — M5137 Other intervertebral disc degeneration, lumbosacral region: Secondary | ICD-10-CM

## 2014-11-11 DIAGNOSIS — Z131 Encounter for screening for diabetes mellitus: Secondary | ICD-10-CM

## 2014-11-11 LAB — CBC WITH DIFFERENTIAL/PLATELET
BASOS ABS: 0.1 10*3/uL (ref 0.0–0.1)
BASOS PCT: 1 % (ref 0–1)
EOS ABS: 0.1 10*3/uL (ref 0.0–0.7)
Eosinophils Relative: 1 % (ref 0–5)
HEMATOCRIT: 45.8 % (ref 39.0–52.0)
Hemoglobin: 15.7 g/dL (ref 13.0–17.0)
Lymphocytes Relative: 13 % (ref 12–46)
Lymphs Abs: 1.4 10*3/uL (ref 0.7–4.0)
MCH: 33.5 pg (ref 26.0–34.0)
MCHC: 34.3 g/dL (ref 30.0–36.0)
MCV: 97.9 fL (ref 78.0–100.0)
MPV: 9.2 fL (ref 8.6–12.4)
Monocytes Absolute: 1.2 10*3/uL — ABNORMAL HIGH (ref 0.1–1.0)
Monocytes Relative: 11 % (ref 3–12)
NEUTROS PCT: 74 % (ref 43–77)
Neutro Abs: 8 10*3/uL — ABNORMAL HIGH (ref 1.7–7.7)
PLATELETS: 277 10*3/uL (ref 150–400)
RBC: 4.68 MIL/uL (ref 4.22–5.81)
RDW: 13.2 % (ref 11.5–15.5)
WBC: 10.8 10*3/uL — AB (ref 4.0–10.5)

## 2014-11-11 LAB — COMPLETE METABOLIC PANEL WITH GFR
ALT: 19 U/L (ref 0–53)
AST: 18 U/L (ref 0–37)
Albumin: 4 g/dL (ref 3.5–5.2)
Alkaline Phosphatase: 72 U/L (ref 39–117)
BILIRUBIN TOTAL: 0.4 mg/dL (ref 0.2–1.2)
BUN: 20 mg/dL (ref 6–23)
CO2: 28 meq/L (ref 19–32)
CREATININE: 1.01 mg/dL (ref 0.50–1.35)
Calcium: 9 mg/dL (ref 8.4–10.5)
Chloride: 105 mEq/L (ref 96–112)
GFR, EST NON AFRICAN AMERICAN: 82 mL/min
GLUCOSE: 97 mg/dL (ref 70–99)
Potassium: 4.4 mEq/L (ref 3.5–5.3)
Sodium: 143 mEq/L (ref 135–145)
Total Protein: 6.4 g/dL (ref 6.0–8.3)

## 2014-11-11 LAB — LIPASE: LIPASE: 11 U/L (ref 0–75)

## 2014-11-11 LAB — LIPID PANEL
Cholesterol: 157 mg/dL (ref 0–200)
HDL: 39 mg/dL — AB (ref >39)
LDL CALC: 106 mg/dL — AB (ref 0–99)
Total CHOL/HDL Ratio: 4 Ratio
Triglycerides: 61 mg/dL (ref <150)
VLDL: 12 mg/dL (ref 0–40)

## 2014-11-11 MED ORDER — IOHEXOL 300 MG/ML  SOLN
100.0000 mL | Freq: Once | INTRAMUSCULAR | Status: AC | PRN
  Administered 2014-11-11: 100 mL via INTRAVENOUS

## 2014-11-11 MED ORDER — DICYCLOMINE HCL 10 MG PO CAPS: 10.0000 mg | ORAL_CAPSULE | Freq: Three times a day (TID) | ORAL | Status: DC

## 2014-11-11 NOTE — Telephone Encounter (Signed)
PA required for CT abdomen pelvis with contrast -  Approved [id number]

## 2014-11-11 NOTE — Progress Notes (Signed)
   Subjective:    Patient ID: Raymond Mitchell, male    DOB: 12-Nov-1955, 59 y.o.   MRN: [id number]  HPI Pt is a 59 yo male who presents to the clinic with 3 weeks of intense morning mid to lower abdominal pain and diarrhea that rates 6-7/10 pain. It then clears and he can go most of his day without any significant symptoms. He denies any blood in his stool. He does occasionally feel some rectal pressure. He is only use a suppository once with no real relief. He is not aware of any food triggers. This is been happening every morning for the last 3 weeks. He had a colonoscopy 8 years ago negative for polyps but did show some diverticulosis. He denies any fever, chills, nausea or vomiting.  Review of Systems  All other systems reviewed and are negative.      Objective:   Physical Exam  Constitutional: He is oriented to person, place, and time. He appears well-developed and well-nourished.  HENT:  Head: Normocephalic and atraumatic.  Eyes:  Slight yellow tint to sclera of bilateral eyes.   Cardiovascular: Normal rate, regular rhythm and normal heart sounds.   Pulmonary/Chest: Effort normal and breath sounds normal.  Abdominal: Soft. Bowel sounds are normal. He exhibits no distension and no mass. There is tenderness. There is no rebound and no guarding.  Some diffuse tenderness over bilateral lower abdominal quadrant but pt does not appear to be in any pain.   Neurological: He is alert and oriented to person, place, and time.  Skin: Skin is dry.  Psychiatric: He has a normal mood and affect. His behavior is normal.          Assessment & Plan:  Acute bilateral abdominal pain/diarrhea- CT scan was negative for any acute pathology. Symptoms could be some IBS but weird abrupt start and that symptoms resolve after morning episode.  Start bentyl and probiotic. Will refer to GI. Did get cbc and cmp. Follow up with any acute changing symptoms.   Hyperlipidemia- will see Dr. Madilyn Fireman for follow  up but wanted blood work ordered today.

## 2014-11-20 ENCOUNTER — Encounter: Payer: Self-pay | Admitting: Family Medicine

## 2014-11-20 ENCOUNTER — Ambulatory Visit (INDEPENDENT_AMBULATORY_CARE_PROVIDER_SITE_OTHER): Payer: BLUE CROSS/BLUE SHIELD | Admitting: Family Medicine

## 2014-11-20 VITALS — BP 131/85 | HR 71 | Temp 98.2°F | Wt 197.0 lb

## 2014-11-20 DIAGNOSIS — L219 Seborrheic dermatitis, unspecified: Secondary | ICD-10-CM

## 2014-11-20 DIAGNOSIS — R197 Diarrhea, unspecified: Secondary | ICD-10-CM | POA: Diagnosis not present

## 2014-11-20 NOTE — Progress Notes (Signed)
   Subjective:    Patient ID: Raymond Mitchell, male    DOB: Dec 18, 1955, 59 y.o.   MRN: [id number]  HPI Diarrhea x 4 weeks in a 59 year old male with a history of ankylosing spondylitis.Marland Kitchen  No change in diet. No nausea. N o GERD.  Mostly multiple loose stools in the AM.  Rarely later in the day.  Occ wakes up in the middle of hte night with diarrhea. Did see GI last Tuesday and did stool samples. He has been taking a probiotic as well as dicyclomine. He feels like they really have not been helping. He's been taking them consistently for the last 8 days. Scheduled for colonoscopy in March. Has also tried probiotic.    Scalp it better.  Went to dermatologist back in December.  alteranting shampoo and scalpt steroie every other day.    Review of Systems     Objective:   Physical Exam  Constitutional: He is oriented to person, place, and time. He appears well-developed and well-nourished.  HENT:  Head: Normocephalic and atraumatic.  Cardiovascular: Normal rate, regular rhythm and normal heart sounds.   Pulmonary/Chest: Effort normal and breath sounds normal.  Abdominal: Bowel sounds are normal. He exhibits no distension and no mass. There is tenderness. There is no rebound and no guarding.  Mild tenderness in the epigastrum and RUQ  Neurological: He is alert and oriented to person, place, and time.  Skin: Skin is warm and dry.  Psychiatric: He has a normal mood and affect. His behavior is normal.          Assessment & Plan:  Diarrhea - actually a little improved today which is the first time in a most 4 weeks. I did encourage him to keep his colonoscopy. I think it's important to evaluate for other causes such as autoimmune disorders like Crohn's disease and ulcerative colitis. He is up-to-date on his colonoscopy and typically colon cancer does not present with diarrhea since tried to give him some reassurance about this as he was very worried. Okay to stop the probiotic in the  dicyclomine since not really helping his symptoms. Continue with bland diet. We did call to get a copy of the [facility] He had not heard back from them yet. He was negative for viral etiology including Norvir virus and other bacteria such as Escherichia coli etc. He also tested negative for gluten intolerance.  Seborrheic dermatitis-responding well to current treatment with ketoconazole shampoo and topical steroid.  Follow-up in 6 months for hypertension.

## 2014-12-01 LAB — HM COLONOSCOPY

## 2015-01-21 ENCOUNTER — Other Ambulatory Visit: Payer: Self-pay | Admitting: Family Medicine

## 2015-01-26 ENCOUNTER — Other Ambulatory Visit: Payer: Self-pay | Admitting: Family Medicine

## 2015-02-08 ENCOUNTER — Other Ambulatory Visit: Payer: Self-pay | Admitting: Family Medicine

## 2015-06-06 ENCOUNTER — Other Ambulatory Visit: Payer: Self-pay | Admitting: Family Medicine

## 2015-06-14 ENCOUNTER — Other Ambulatory Visit: Payer: Self-pay | Admitting: Family Medicine

## 2015-07-17 ENCOUNTER — Other Ambulatory Visit: Payer: Self-pay | Admitting: Family Medicine

## 2015-08-14 ENCOUNTER — Other Ambulatory Visit: Payer: Self-pay | Admitting: Family Medicine

## 2015-08-17 ENCOUNTER — Telehealth: Payer: Self-pay | Admitting: *Deleted

## 2015-08-17 DIAGNOSIS — I10 Essential (primary) hypertension: Secondary | ICD-10-CM

## 2015-08-17 NOTE — Telephone Encounter (Signed)
Pt called and informed that he will need to be seen and have labs done. appt made labs faxed.Audelia Hives [place]

## 2015-08-31 ENCOUNTER — Other Ambulatory Visit: Payer: Self-pay | Admitting: Family Medicine

## 2015-09-04 LAB — BASIC METABOLIC PANEL
BUN: 16 mg/dL (ref 7–25)
CHLORIDE: 105 mmol/L (ref 98–110)
CO2: 28 mmol/L (ref 20–31)
Calcium: 8.8 mg/dL (ref 8.6–10.3)
Creat: 1.02 mg/dL (ref 0.70–1.33)
GLUCOSE: 111 mg/dL — AB (ref 65–99)
POTASSIUM: 4.4 mmol/L (ref 3.5–5.3)
Sodium: 142 mmol/L (ref 135–146)

## 2015-09-05 NOTE — Telephone Encounter (Signed)
Quick Note:  All labs are normal. ______ 

## 2015-09-06 ENCOUNTER — Encounter: Payer: Self-pay | Admitting: Family Medicine

## 2015-09-06 ENCOUNTER — Ambulatory Visit (INDEPENDENT_AMBULATORY_CARE_PROVIDER_SITE_OTHER): Payer: BLUE CROSS/BLUE SHIELD | Admitting: Family Medicine

## 2015-09-06 VITALS — BP 126/86 | HR 81 | Temp 98.3°F | Resp 18 | Wt 200.9 lb

## 2015-09-06 DIAGNOSIS — I10 Essential (primary) hypertension: Secondary | ICD-10-CM

## 2015-09-06 DIAGNOSIS — Z23 Encounter for immunization: Secondary | ICD-10-CM

## 2015-09-06 DIAGNOSIS — N400 Enlarged prostate without lower urinary tract symptoms: Secondary | ICD-10-CM | POA: Diagnosis not present

## 2015-09-06 DIAGNOSIS — L309 Dermatitis, unspecified: Secondary | ICD-10-CM | POA: Diagnosis not present

## 2015-09-06 DIAGNOSIS — R7301 Impaired fasting glucose: Secondary | ICD-10-CM | POA: Diagnosis not present

## 2015-09-06 LAB — POCT GLYCOSYLATED HEMOGLOBIN (HGB A1C): Hemoglobin A1C: 6.1

## 2015-09-06 MED ORDER — PRAVASTATIN SODIUM 80 MG PO TABS: 80.0000 mg | ORAL_TABLET | Freq: Every day | ORAL | Status: DC

## 2015-09-06 MED ORDER — BETAMETHASONE VALERATE 0.12 % EX FOAM: 1.0000 "application " | Freq: Two times a day (BID) | CUTANEOUS | Status: DC

## 2015-09-06 NOTE — Progress Notes (Signed)
   Subjective:    Patient ID: Raymond Mitchell, male    DOB: 1956-07-28, 59 y.o.   MRN: [id number]  HPI Hypertension- Pt denies chest pain, SOB, dizziness, or heart palpitations.  Taking meds as directed w/o problems.  Denies medication side effects.  Hasn't been walking lately.    BPH- dong well on flomax.  No new symptoms.    Abnormal glucose - glucose 111 on recent labs. He reports he was fasting.    Colitis sxs are much improved. Had colonoscopy in Feb and then put on medication.  Off of steroids.    Rash on abdomen for a few weeks. Noticed some red itchy bumps. Hx of dry skin and eczema.     Review of Systems     Objective:   Physical Exam  Constitutional: He is oriented to person, place, and time. He appears well-developed and well-nourished.  HENT:  Head: Normocephalic and atraumatic.  Cardiovascular: Normal rate, regular rhythm and normal heart sounds.   Pulmonary/Chest: Effort normal and breath sounds normal.  Neurological: He is alert and oriented to person, place, and time.  Skin: Skin is warm and dry.  Psychiatric: He has a normal mood and affect. His behavior is normal.    On the anterior abdomen on the right and left side he has some dry scaling erythematous papules and on the right side has an approximately 3 x 4 cm dry scaly patch.      Assessment & Plan:  HTN -  well controlled. Continue current regimen. Follow up in 6 months.  BPH-  Refills sent to the pharmacy for Flomax. Continue to monitor.   Rash - consistant with eczema. Will tx with topical steroid cream. He actually has a follow-up with his rheumatologist soon.   Impaired fasting glucose-new diagnosis. Discussed dietary changes also recommend regular exercise. Follow back up in 6 months. Lab Results  Component Value Date   HGBA1C 6.1 09/06/2015      Flu vaccine given today.

## 2015-09-17 ENCOUNTER — Other Ambulatory Visit: Payer: Self-pay | Admitting: Family Medicine

## 2015-09-20 ENCOUNTER — Other Ambulatory Visit: Payer: Self-pay | Admitting: Family Medicine

## 2015-12-16 ENCOUNTER — Other Ambulatory Visit: Payer: Self-pay | Admitting: Family Medicine

## 2016-01-05 ENCOUNTER — Encounter: Payer: Self-pay | Admitting: Sports Medicine

## 2016-01-05 ENCOUNTER — Ambulatory Visit (INDEPENDENT_AMBULATORY_CARE_PROVIDER_SITE_OTHER): Payer: BLUE CROSS/BLUE SHIELD | Admitting: Sports Medicine

## 2016-01-05 ENCOUNTER — Ambulatory Visit (INDEPENDENT_AMBULATORY_CARE_PROVIDER_SITE_OTHER): Payer: BLUE CROSS/BLUE SHIELD

## 2016-01-05 DIAGNOSIS — M50323 Other cervical disc degeneration at C6-C7 level: Secondary | ICD-10-CM | POA: Diagnosis not present

## 2016-01-05 DIAGNOSIS — M5412 Radiculopathy, cervical region: Secondary | ICD-10-CM

## 2016-01-05 DIAGNOSIS — M5032 Other cervical disc degeneration, mid-cervical region, unspecified level: Secondary | ICD-10-CM | POA: Diagnosis not present

## 2016-01-05 MED ORDER — CYCLOBENZAPRINE HCL 10 MG PO TABS: ORAL_TABLET | ORAL | Status: DC

## 2016-01-05 MED ORDER — PREDNISONE 50 MG PO TABS: ORAL_TABLET | ORAL | Status: DC

## 2016-01-05 MED ORDER — CELECOXIB 200 MG PO CAPS: ORAL_CAPSULE | ORAL | Status: DC

## 2016-01-05 MED ORDER — KETOROLAC TROMETHAMINE 30 MG/ML IJ SOLN
30.0000 mg | Freq: Once | INTRAMUSCULAR | Status: AC
  Administered 2016-01-05: 30 mg via INTRAMUSCULAR

## 2016-01-05 MED ORDER — METHYLPREDNISOLONE SODIUM SUCC 125 MG IJ SOLR
125.0000 mg | Freq: Once | INTRAMUSCULAR | Status: AC
  Administered 2016-01-05: 125 mg via INTRAMUSCULAR

## 2016-01-05 NOTE — Assessment & Plan Note (Signed)
[name].  At this point we are going to proceed conservatively, prednisone, Celebrex, Flexeril. Toradol 30, Solu-Medrol 125. Home rehabilitation exercises, x-rays, return to see me in one month, MRI for intervention if no better.

## 2016-01-05 NOTE — Progress Notes (Signed)
   Subjective:    I'm seeing this patient as a consultation for:  Dr. Beatrice Lecher  CC: Neck pain  HPI: This is a pleasant 60 year old male with a long history of cervical spondylosis, he has been through physical therapy, and several epidural injections years ago. He did well for several years but is now having recurrence of pain in his neck with radiation down the left arm in a C5 and C6 type distribution. Moderate, persistent, better with abduction of the left shoulder.  Past medical history, Surgical history, Family history not pertinant except as noted below, Social history, Allergies, and medications have been entered into the medical record, reviewed, and no changes needed.   Review of Systems: No headache, visual changes, nausea, vomiting, diarrhea, constipation, dizziness, abdominal pain, skin rash, fevers, chills, night sweats, weight loss, swollen lymph nodes, body aches, joint swelling, muscle aches, chest pain, shortness of breath, mood changes, visual or auditory hallucinations.   Objective:   General: Well Developed, well nourished, and in no acute distress.  Neuro/Psych: Alert and oriented x3, extra-ocular muscles intact, able to move all 4 extremities, sensation grossly intact. Skin: Warm and dry, no rashes noted.  Respiratory: Not using accessory muscles, speaking in full sentences, trachea midline.  Cardiovascular: Pulses palpable, no extremity edema. Abdomen: Does not appear distended. Neck: Negative spurling's Full neck range of motion Grip strength and sensation normal in bilateral hands Strength good C4 to T1 distribution No sensory change to C4 to T1 Reflexes normal Left Shoulder: Inspection reveals no abnormalities, atrophy or asymmetry. Palpation is normal with no tenderness over AC joint or bicipital groove. ROM is full in all planes. Rotator cuff strength normal throughout. No signs of impingement with negative Neer and Hawkin's tests, empty  can. Speeds and Yergason's tests normal. No labral pathology noted with negative Obrien's, negative crank, negative clunk, and good stability. Normal scapular function observed. No painful arc and no drop arm sign. No apprehension sign  Impression and Recommendations:   This case required medical decision making of moderate complexity.

## 2016-01-06 ENCOUNTER — Telehealth: Payer: Self-pay | Admitting: *Deleted

## 2016-01-06 NOTE — Telephone Encounter (Signed)
PA initiated for celebrex

## 2016-01-10 NOTE — Telephone Encounter (Signed)
Received denial letter; called patient to see what anti-inflammatories he had tries , patient states aleve and cannot remember what else b ut he can not take most antiinflammatories because it caused a problem with his colon. Pt did state the pharmacy called him today and states they were able to "get the medication to go through." he has picked up the medication without any problems and he did get the full quantity rxed with no problems

## 2016-01-11 ENCOUNTER — Ambulatory Visit (INDEPENDENT_AMBULATORY_CARE_PROVIDER_SITE_OTHER): Payer: BLUE CROSS/BLUE SHIELD | Admitting: Family Medicine

## 2016-01-11 ENCOUNTER — Encounter: Payer: Self-pay | Admitting: Family Medicine

## 2016-01-11 VITALS — BP 146/74 | HR 77 | Temp 98.6°F | Wt 202.0 lb

## 2016-01-11 DIAGNOSIS — J018 Other acute sinusitis: Secondary | ICD-10-CM

## 2016-01-11 MED ORDER — BETAMETHASONE VALERATE 0.12 % EX FOAM: 1.0000 "application " | Freq: Two times a day (BID) | CUTANEOUS | Status: DC

## 2016-01-11 MED ORDER — AMOXICILLIN-POT CLAVULANATE 875-125 MG PO TABS: 1.0000 | ORAL_TABLET | Freq: Two times a day (BID) | ORAL | Status: DC

## 2016-01-11 NOTE — Patient Instructions (Signed)

## 2016-01-11 NOTE — Progress Notes (Signed)
   Subjective:    Patient ID: Raymond Mitchell, male    DOB: May 25, 1956, 60 y.o.   MRN: [id number]  HPI  Patient comes in today complaining of 3 days of significant sinus congestion and pressure in his face. No headache. He said it started initially with a mild sore throat on Sunday. The throat is little bit better. No ear pain. No cough or chest congestion. He has not been taking any medications for his symptoms. Does feel like he's been eating some sweats and feeling hot but has not actually checked his temperature. He also feels like his colitis has flared up a little bit last couple of days. He just finished a 5 day course of prednisone on Sunday for his shoulder.  Review of Systems     Objective:   Physical Exam  Constitutional: He is oriented to person, place, and time. He appears well-developed and well-nourished.  HENT:  Head: Normocephalic and atraumatic.  Right Ear: External ear normal.  Left Ear: External ear normal.  Nose: Nose normal.  Mouth/Throat: Oropharynx is clear and moist.  TMs and canals are clear.   Eyes: Conjunctivae and EOM are normal. Pupils are equal, round, and reactive to light.  Neck: Neck supple. No thyromegaly present.  Cardiovascular: Normal rate and normal heart sounds.   Pulmonary/Chest: Effort normal and breath sounds normal.  Lymphadenopathy:    He has no cervical adenopathy.  Neurological: He is alert and oriented to person, place, and time.  Skin: Skin is warm and dry.  Psychiatric: He has a normal mood and affect.          Assessment & Plan:  Acute sinusitis 3 days-likely viral. Gave him reassurance. Recommended over-the-counter treatments. If he is not feeling better by the end of the week and okay to fill the perception for the antibiotic. We are asked to close to the holiday on Friday. Or if he suddenly gets worse he can fill it sooner.  Left cervical radiculopathy-he plans on getting back in with Dr. Dianah Field next week. The  prednisone course did not seem to help his pain and if anything actually feels it might be getting a little worse.

## 2016-02-29 ENCOUNTER — Telehealth: Payer: Self-pay | Admitting: Family Medicine

## 2016-02-29 DIAGNOSIS — M5030 Other cervical disc degeneration, unspecified cervical region: Secondary | ICD-10-CM

## 2016-02-29 DIAGNOSIS — M459 Ankylosing spondylitis of unspecified sites in spine: Secondary | ICD-10-CM

## 2016-02-29 DIAGNOSIS — M5137 Other intervertebral disc degeneration, lumbosacral region: Secondary | ICD-10-CM

## 2016-02-29 NOTE — Telephone Encounter (Signed)
We do not do long term narcotic management.  He would need a referral to pain management.

## 2016-02-29 NOTE — Telephone Encounter (Signed)
Patient came in today and reports that his neurosurgeon is retiring and he is having the records from Dr. Sande Rives office sent here to [place].  He signed a release here today.  He wants to know if you or Dr. Darene Lamer would be willing to manage his Hydrocodon-acetaminophine rx from now on. He takes 7.5-325.  He only has a few left. I told him that I would send you and Dr. Darene Lamer a message about his request.  Thanks

## 2016-02-29 NOTE — Telephone Encounter (Signed)
I agree. Will place pain referrral.

## 2016-03-06 NOTE — Telephone Encounter (Signed)
Called pt and informed him that this has been sent to pain management. Pt stated that he has not heard anything from CPS. I informed him that I would fwd back to pcp and Dr. Darene Lamer for advice.Audelia Hives [place]

## 2016-03-07 ENCOUNTER — Other Ambulatory Visit: Payer: Self-pay | Admitting: *Deleted

## 2016-03-07 MED ORDER — HYDROCODONE-ACETAMINOPHEN 7.5-325 MG PO TABS
ORAL_TABLET | ORAL | Status: DC
Start: 2016-03-07 — End: 2017-01-23

## 2016-03-07 NOTE — Telephone Encounter (Signed)
Gave Rx to pt's wife today at her OV.Maryruth Eve, [place]

## 2016-03-07 NOTE — Telephone Encounter (Signed)
Will refill pt's vicodin for this month ONLY. Will speak with [name] about his referral to CPS.Raymond Mitchell

## 2016-03-07 NOTE — Telephone Encounter (Signed)
Will have Cindy to check on this.Audelia Hives [place]

## 2016-03-07 NOTE — Telephone Encounter (Signed)
Med filled for 30 days until can get in with pain management.

## 2016-03-09 DIAGNOSIS — Z79899 Other long term (current) drug therapy: Secondary | ICD-10-CM | POA: Diagnosis not present

## 2016-03-09 DIAGNOSIS — G8929 Other chronic pain: Secondary | ICD-10-CM | POA: Diagnosis not present

## 2016-03-09 DIAGNOSIS — M488X6 Other specified spondylopathies, lumbar region: Secondary | ICD-10-CM | POA: Diagnosis not present

## 2016-03-09 DIAGNOSIS — M47816 Spondylosis without myelopathy or radiculopathy, lumbar region: Secondary | ICD-10-CM | POA: Diagnosis not present

## 2016-03-09 DIAGNOSIS — M5387 Other specified dorsopathies, lumbosacral region: Secondary | ICD-10-CM | POA: Diagnosis not present

## 2016-03-16 ENCOUNTER — Other Ambulatory Visit: Payer: Self-pay | Admitting: Family Medicine

## 2016-03-17 ENCOUNTER — Encounter: Payer: Self-pay | Admitting: Physician Assistant

## 2016-03-17 ENCOUNTER — Ambulatory Visit (INDEPENDENT_AMBULATORY_CARE_PROVIDER_SITE_OTHER): Payer: BLUE CROSS/BLUE SHIELD | Admitting: Physician Assistant

## 2016-03-17 VITALS — BP 136/79 | HR 74 | Temp 98.0°F | Ht 74.0 in | Wt 203.0 lb

## 2016-03-17 DIAGNOSIS — R491 Aphonia: Secondary | ICD-10-CM | POA: Diagnosis not present

## 2016-03-17 DIAGNOSIS — J029 Acute pharyngitis, unspecified: Secondary | ICD-10-CM

## 2016-03-17 LAB — POCT RAPID STREP A (OFFICE): RAPID STREP A SCREEN: NEGATIVE

## 2016-03-17 MED ORDER — PREDNISONE 20 MG PO TABS: ORAL_TABLET | ORAL | Status: DC

## 2016-03-17 NOTE — Patient Instructions (Signed)

## 2016-03-17 NOTE — Progress Notes (Signed)
   Subjective:    Patient ID: Raymond Mitchell, male    DOB: 31-Oct-1955, 60 y.o.   MRN: [id number]  HPI  Pt is a 60 yo male who presents to the clinic with ST that started yesterday. ST has been off and on and not constant. Today has been more persisent. Denies any fever, chills, sinus pressure, cough. He is concerned because he smokes a pack a day. Pain is the worse when swallows. He is also off and on hoarse but that has been for at least 2 weeks. Not taken anything to make better.  .    Review of Systems See HPI.     Objective:   Physical Exam  Constitutional: He is oriented to person, place, and time. He appears well-developed and well-nourished.  HENT:  Head: Normocephalic and atraumatic.  Right Ear: External ear normal.  Left Ear: External ear normal.  Nose: Nose normal.  Mouth/Throat: No oropharyngeal exudate.  TM's clear.  Right tonsil enlarged slighly. No exudate. Uvula midline.  Negative for any sinus tenderness.   Eyes: Conjunctivae are normal. Right eye exhibits no discharge. Left eye exhibits no discharge.  Neck: Normal range of motion. Neck supple.  Right sided lymphnodes non tender but enlarged.   Cardiovascular: Normal rate, regular rhythm and normal heart sounds.   Pulmonary/Chest: Effort normal and breath sounds normal. He has no wheezes.  Neurological: He is alert and oriented to person, place, and time.  Psychiatric: He has a normal mood and affect. His behavior is normal.          Assessment & Plan:  Sore throat/loss of voice- rapid strep negative. Reassured patient likely viral. Prednisone taper given. Dicussed salt water gargles and symptomatic care. Follow up if not improving.

## 2016-04-12 DIAGNOSIS — M5387 Other specified dorsopathies, lumbosacral region: Secondary | ICD-10-CM | POA: Diagnosis not present

## 2016-04-12 DIAGNOSIS — M488X6 Other specified spondylopathies, lumbar region: Secondary | ICD-10-CM | POA: Diagnosis not present

## 2016-04-12 DIAGNOSIS — G8929 Other chronic pain: Secondary | ICD-10-CM | POA: Diagnosis not present

## 2016-04-12 DIAGNOSIS — M47816 Spondylosis without myelopathy or radiculopathy, lumbar region: Secondary | ICD-10-CM | POA: Diagnosis not present

## 2016-04-12 DIAGNOSIS — Z79891 Long term (current) use of opiate analgesic: Secondary | ICD-10-CM | POA: Diagnosis not present

## 2016-05-15 ENCOUNTER — Other Ambulatory Visit: Payer: Self-pay | Admitting: Sports Medicine

## 2016-05-15 DIAGNOSIS — M5412 Radiculopathy, cervical region: Secondary | ICD-10-CM

## 2016-05-23 DIAGNOSIS — M488X6 Other specified spondylopathies, lumbar region: Secondary | ICD-10-CM | POA: Diagnosis not present

## 2016-05-23 DIAGNOSIS — G8929 Other chronic pain: Secondary | ICD-10-CM | POA: Diagnosis not present

## 2016-05-23 DIAGNOSIS — M5387 Other specified dorsopathies, lumbosacral region: Secondary | ICD-10-CM | POA: Diagnosis not present

## 2016-05-23 DIAGNOSIS — M47816 Spondylosis without myelopathy or radiculopathy, lumbar region: Secondary | ICD-10-CM | POA: Diagnosis not present

## 2016-06-20 DIAGNOSIS — G8929 Other chronic pain: Secondary | ICD-10-CM | POA: Diagnosis not present

## 2016-06-20 DIAGNOSIS — M47816 Spondylosis without myelopathy or radiculopathy, lumbar region: Secondary | ICD-10-CM | POA: Diagnosis not present

## 2016-06-20 DIAGNOSIS — Z79891 Long term (current) use of opiate analgesic: Secondary | ICD-10-CM | POA: Diagnosis not present

## 2016-06-20 DIAGNOSIS — M488X6 Other specified spondylopathies, lumbar region: Secondary | ICD-10-CM | POA: Diagnosis not present

## 2016-06-23 ENCOUNTER — Other Ambulatory Visit: Payer: Self-pay | Admitting: Family Medicine

## 2016-07-18 DIAGNOSIS — Z79891 Long term (current) use of opiate analgesic: Secondary | ICD-10-CM | POA: Diagnosis not present

## 2016-07-18 DIAGNOSIS — M488X6 Other specified spondylopathies, lumbar region: Secondary | ICD-10-CM | POA: Diagnosis not present

## 2016-07-18 DIAGNOSIS — M47816 Spondylosis without myelopathy or radiculopathy, lumbar region: Secondary | ICD-10-CM | POA: Diagnosis not present

## 2016-07-18 DIAGNOSIS — G8929 Other chronic pain: Secondary | ICD-10-CM | POA: Diagnosis not present

## 2016-08-20 ENCOUNTER — Other Ambulatory Visit: Payer: Self-pay | Admitting: Sports Medicine

## 2016-08-20 DIAGNOSIS — M5412 Radiculopathy, cervical region: Secondary | ICD-10-CM

## 2016-09-11 ENCOUNTER — Other Ambulatory Visit: Payer: Self-pay | Admitting: Family Medicine

## 2016-09-12 ENCOUNTER — Ambulatory Visit (INDEPENDENT_AMBULATORY_CARE_PROVIDER_SITE_OTHER): Payer: BLUE CROSS/BLUE SHIELD | Admitting: Sports Medicine

## 2016-09-12 ENCOUNTER — Ambulatory Visit (INDEPENDENT_AMBULATORY_CARE_PROVIDER_SITE_OTHER): Payer: BLUE CROSS/BLUE SHIELD

## 2016-09-12 ENCOUNTER — Encounter: Payer: Self-pay | Admitting: Sports Medicine

## 2016-09-12 DIAGNOSIS — Z23 Encounter for immunization: Secondary | ICD-10-CM | POA: Diagnosis not present

## 2016-09-12 DIAGNOSIS — M4607 Spinal enthesopathy, lumbosacral region: Secondary | ICD-10-CM | POA: Diagnosis not present

## 2016-09-12 DIAGNOSIS — M5416 Radiculopathy, lumbar region: Secondary | ICD-10-CM

## 2016-09-12 DIAGNOSIS — M4606 Spinal enthesopathy, lumbar region: Secondary | ICD-10-CM

## 2016-09-12 DIAGNOSIS — M545 Low back pain: Secondary | ICD-10-CM | POA: Diagnosis not present

## 2016-09-12 MED ORDER — GABAPENTIN 300 MG PO CAPS: ORAL_CAPSULE | ORAL | 3 refills | Status: DC

## 2016-09-12 MED ORDER — PREDNISONE 50 MG PO TABS: ORAL_TABLET | ORAL | 0 refills | Status: DC

## 2016-09-12 MED ORDER — CYCLOBENZAPRINE HCL 10 MG PO TABS: ORAL_TABLET | ORAL | 0 refills | Status: DC

## 2016-09-12 NOTE — Assessment & Plan Note (Signed)
Status post multilevel fusion. Now with recurrence of right-sided S1 distribution radiculopathy. Previous MRIs have shown scarring around the S1 nerve root. Prednisone, Flexeril, gabapentin. Baseline x-rays, home rehabilitation exercises. Return in one month, MRI with and without IV contrast for interventional planning if no better.

## 2016-09-12 NOTE — Progress Notes (Signed)
   Subjective:    I'm seeing this patient as a consultation for:  Dr. Beatrice Lecher  CC: Low back pain  HPI: This is a pleasant 60 year old male, 5 years ago he had a 2 level fusion procedure.  He did well initially but then proceeded to have increasing back pain with radiation down the right leg, to the lateral ankle. Pain is moderate, persistent, denies any bowel or bladder dysfunction, no constitutional symptoms. No history of trauma.  I did treat him for cervical radiculopathy several months ago which resolved with conservative measures.  Past medical history:  Negative.  See flowsheet/record as well for more information.  Surgical history: Negative.  See flowsheet/record as well for more information.  Family history: Negative.  See flowsheet/record as well for more information.  Social history: Negative.  See flowsheet/record as well for more information.  Allergies, and medications have been entered into the medical record, reviewed, and no changes needed.   Review of Systems: No headache, visual changes, nausea, vomiting, diarrhea, constipation, dizziness, abdominal pain, skin rash, fevers, chills, night sweats, weight loss, swollen lymph nodes, body aches, joint swelling, muscle aches, chest pain, shortness of breath, mood changes, visual or auditory hallucinations.   Objective:   General: Well Developed, well nourished, and in no acute distress.  Neuro/Psych: Alert and oriented x3, extra-ocular muscles intact, able to move all 4 extremities, sensation grossly intact. Skin: Warm and dry, no rashes noted.  Respiratory: Not using accessory muscles, speaking in full sentences, trachea midline.  Cardiovascular: Pulses palpable, no extremity edema. Abdomen: Does not appear distended. Back Exam:  Inspection: Unremarkable  Motion: Flexion 45 deg, Extension 45 deg, Side Bending to 45 deg bilaterally,  Rotation to 45 deg bilaterally  SLR laying: Negative  XSLR laying: Negative    Palpable tenderness: None. FABER: negative. Sensory change: Gross sensation intact to all lumbar and sacral dermatomes.  Reflexes: 2+ at both patellar tendons, 2+ at achilles tendons, Babinski's downgoing.  Strength at foot  Plantar-flexion: 5/5 Dorsi-flexion: 5/5 Eversion: 5/5 Inversion: 5/5  Leg strength  Quad: 5/5 Hamstring: 5/5 Hip flexor: 5/5 Hip abductors: 5/5  Gait unremarkable.  Impression and Recommendations:   This case required medical decision making of moderate complexity.  Right lumbar radiculopathy Status post multilevel fusion. Now with recurrence of right-sided S1 distribution radiculopathy. Previous MRIs have shown scarring around the S1 nerve root. Prednisone, Flexeril, gabapentin. Baseline x-rays, home rehabilitation exercises. Return in one month, MRI with and without IV contrast for interventional planning if no better.

## 2016-09-28 ENCOUNTER — Telehealth: Payer: Self-pay | Admitting: Family Medicine

## 2016-09-28 DIAGNOSIS — E785 Hyperlipidemia, unspecified: Secondary | ICD-10-CM

## 2016-09-28 DIAGNOSIS — I10 Essential (primary) hypertension: Secondary | ICD-10-CM

## 2016-09-28 DIAGNOSIS — N400 Enlarged prostate without lower urinary tract symptoms: Secondary | ICD-10-CM

## 2016-09-28 DIAGNOSIS — Z0000 Encounter for general adult medical examination without abnormal findings: Secondary | ICD-10-CM

## 2016-09-28 DIAGNOSIS — Z131 Encounter for screening for diabetes mellitus: Secondary | ICD-10-CM

## 2016-09-28 NOTE — Addendum Note (Signed)
Addended by: Huel Cote on: 09/28/2016 01:25 PM   Modules accepted: Orders

## 2016-09-28 NOTE — Telephone Encounter (Signed)
OK, for A1C, CMP, Lipid, PSA. Please order and notify pt when done.  Beatrice Lecher, MD

## 2016-09-28 NOTE — Telephone Encounter (Signed)
Orders placed. Pt notified.

## 2016-09-28 NOTE — Telephone Encounter (Signed)
Pt would like to have labs done before his apt on 10/10/16 because he has not had them done in a while. Thanks

## 2016-10-06 DIAGNOSIS — N400 Enlarged prostate without lower urinary tract symptoms: Secondary | ICD-10-CM | POA: Diagnosis not present

## 2016-10-06 DIAGNOSIS — I10 Essential (primary) hypertension: Secondary | ICD-10-CM | POA: Diagnosis not present

## 2016-10-06 DIAGNOSIS — Z0000 Encounter for general adult medical examination without abnormal findings: Secondary | ICD-10-CM | POA: Diagnosis not present

## 2016-10-06 DIAGNOSIS — E785 Hyperlipidemia, unspecified: Secondary | ICD-10-CM | POA: Diagnosis not present

## 2016-10-07 LAB — CBC WITH DIFFERENTIAL/PLATELET
BASOS ABS: 85 {cells}/uL (ref 0–200)
Basophils Relative: 1 %
EOS PCT: 4 %
Eosinophils Absolute: 340 cells/uL (ref 15–500)
HCT: 43.5 % (ref 38.5–50.0)
Hemoglobin: 14.6 g/dL (ref 13.2–17.1)
Lymphocytes Relative: 23 %
Lymphs Abs: 1955 cells/uL (ref 850–3900)
MCH: 33.6 pg — AB (ref 27.0–33.0)
MCHC: 33.6 g/dL (ref 32.0–36.0)
MCV: 100 fL (ref 80.0–100.0)
MONOS PCT: 11 %
MPV: 9.3 fL (ref 7.5–12.5)
Monocytes Absolute: 935 cells/uL (ref 200–950)
NEUTROS PCT: 61 %
Neutro Abs: 5185 cells/uL (ref 1500–7800)
PLATELETS: 234 10*3/uL (ref 140–400)
RBC: 4.35 MIL/uL (ref 4.20–5.80)
RDW: 13.1 % (ref 11.0–15.0)
WBC: 8.5 10*3/uL (ref 3.8–10.8)

## 2016-10-07 LAB — HEMOGLOBIN A1C
HEMOGLOBIN A1C: 5.4 % (ref <5.7)
MEAN PLASMA GLUCOSE: 108 mg/dL

## 2016-10-07 LAB — LIPID PANEL
Cholesterol: 167 mg/dL (ref <200)
HDL: 41 mg/dL (ref >40)
LDL Cholesterol: 105 mg/dL — ABNORMAL HIGH (ref <100)
Total CHOL/HDL Ratio: 4.1 Ratio (ref <5.0)
Triglycerides: 107 mg/dL (ref <150)
VLDL: 21 mg/dL (ref <30)

## 2016-10-07 LAB — COMPLETE METABOLIC PANEL WITH GFR
ALBUMIN: 3.9 g/dL (ref 3.6–5.1)
ALK PHOS: 55 U/L (ref 40–115)
ALT: 35 U/L (ref 9–46)
AST: 33 U/L (ref 10–35)
BILIRUBIN TOTAL: 0.4 mg/dL (ref 0.2–1.2)
BUN: 25 mg/dL (ref 7–25)
CO2: 29 mmol/L (ref 20–31)
Calcium: 8.6 mg/dL (ref 8.6–10.3)
Chloride: 105 mmol/L (ref 98–110)
Creat: 1.16 mg/dL (ref 0.70–1.33)
GFR, Est African American: 79 mL/min (ref >=60)
GFR, Est Non African American: 69 mL/min (ref >=60)
GLUCOSE: 105 mg/dL — AB (ref 65–99)
POTASSIUM: 4.6 mmol/L (ref 3.5–5.3)
Sodium: 142 mmol/L (ref 135–146)
TOTAL PROTEIN: 6 g/dL — AB (ref 6.1–8.1)

## 2016-10-07 LAB — PSA: PSA: 0.9 ng/mL (ref <=4.0)

## 2016-10-08 ENCOUNTER — Other Ambulatory Visit: Payer: Self-pay | Admitting: Family Medicine

## 2016-10-08 MED ORDER — ATORVASTATIN CALCIUM 40 MG PO TABS: 40.0000 mg | ORAL_TABLET | Freq: Every day | ORAL | 3 refills | Status: DC

## 2016-10-09 DIAGNOSIS — Z79891 Long term (current) use of opiate analgesic: Secondary | ICD-10-CM | POA: Diagnosis not present

## 2016-10-09 DIAGNOSIS — G8929 Other chronic pain: Secondary | ICD-10-CM | POA: Diagnosis not present

## 2016-10-09 DIAGNOSIS — M488X6 Other specified spondylopathies, lumbar region: Secondary | ICD-10-CM | POA: Diagnosis not present

## 2016-10-09 DIAGNOSIS — M47816 Spondylosis without myelopathy or radiculopathy, lumbar region: Secondary | ICD-10-CM | POA: Diagnosis not present

## 2016-10-09 NOTE — Addendum Note (Signed)
Addended by: Teddy Spike on: 10/09/2016 03:05 PM   Modules accepted: Orders

## 2016-10-10 ENCOUNTER — Ambulatory Visit (INDEPENDENT_AMBULATORY_CARE_PROVIDER_SITE_OTHER): Payer: BLUE CROSS/BLUE SHIELD | Admitting: Sports Medicine

## 2016-10-10 ENCOUNTER — Encounter: Payer: Self-pay | Admitting: Family Medicine

## 2016-10-10 ENCOUNTER — Ambulatory Visit (INDEPENDENT_AMBULATORY_CARE_PROVIDER_SITE_OTHER): Payer: BLUE CROSS/BLUE SHIELD | Admitting: Family Medicine

## 2016-10-10 VITALS — BP 149/72 | HR 72 | Ht 74.0 in | Wt 210.0 lb

## 2016-10-10 DIAGNOSIS — M5416 Radiculopathy, lumbar region: Secondary | ICD-10-CM | POA: Diagnosis not present

## 2016-10-10 DIAGNOSIS — I10 Essential (primary) hypertension: Secondary | ICD-10-CM | POA: Diagnosis not present

## 2016-10-10 DIAGNOSIS — E785 Hyperlipidemia, unspecified: Secondary | ICD-10-CM

## 2016-10-10 MED ORDER — LISINOPRIL 10 MG PO TABS: 10.0000 mg | ORAL_TABLET | Freq: Every day | ORAL | 3 refills | Status: DC

## 2016-10-10 NOTE — Progress Notes (Signed)
  Subjective:    CC: Follow-up  HPI: This is a pleasant 61 year old male, he is post 2 level fusion, had some right-sided S1 radiculopathy which we treated with conservative measures and he has resolved.  Past medical history:  Negative.  See flowsheet/record as well for more information.  Surgical history: Negative.  See flowsheet/record as well for more information.  Family history: Negative.  See flowsheet/record as well for more information.  Social history: Negative.  See flowsheet/record as well for more information.  Allergies, and medications have been entered into the medical record, reviewed, and no changes needed.   Review of Systems: No fevers, chills, night sweats, weight loss, chest pain, or shortness of breath.   Objective:    General: Well Developed, well nourished, and in no acute distress.  Neuro: Alert and oriented x3, extra-ocular muscles intact, sensation grossly intact.  HEENT: Normocephalic, atraumatic, pupils equal round reactive to light, neck supple, no masses, no lymphadenopathy, thyroid nonpalpable.  Skin: Warm and dry, no rashes. Cardiac: Regular rate and rhythm, no murmurs rubs or gallops, no lower extremity edema.  Respiratory: Clear to auscultation bilaterally. Not using accessory muscles, speaking in full sentences.  Impression and Recommendations:    Right lumbar radiculopathy Doing well after conservative measures, Flexeril and gabapentin seemed to help as well as prednisone, doing rehabilitation exercises through 4 times per week. Symptoms have resolved, initially he did have right-sided S1 distribution radiculopathy with previous MRIs that showed scarring around the S1 nerve root. Ultimately he is doing well so I can see him back as needed, should this recur we will again proceed with conservative measures before jumped MRI with and without IV contrast for interventional planning.

## 2016-10-10 NOTE — Progress Notes (Signed)
Subjective:    CC: HTN  HPI: Hypertension- Pt denies chest pain, SOB, dizziness, or heart palpitations.  Taking meds as directed w/o problems.  Denies medication side effects.    Hyperlipidemia-just had lab work checked. LDL is still mildly elevated. Made recommendations to switch simvastatin to atorvastatin. Lab Results  Component Value Date   CHOL 167 10/06/2016   HDL 41 10/06/2016   LDLCALC 105 (H) 10/06/2016   TRIG 107 10/06/2016   CHOLHDL 4.1 10/06/2016    Past medical history, Surgical history, Family history not pertinant except as noted below, Social history, Allergies, and medications have been entered into the medical record, reviewed, and corrections made.   Review of Systems: No fevers, chills, night sweats, weight loss, chest pain, or shortness of breath.   Objective:    General: Well Developed, well nourished, and in no acute distress.  Neuro: Alert and oriented x3, extra-ocular muscles intact, sensation grossly intact.  HEENT: Normocephalic, atraumatic  Skin: Warm and dry, no rashes. Cardiac: Regular rate and rhythm, no murmurs rubs or gallops, no lower extremity edema.  Respiratory: Clear to auscultation bilaterally. Not using accessory muscles, speaking in full sentences.   Impression and Recommendations:    HTN - Uncontrolled, when I look back the last couple times he's been here his blood pressure hasn't been optimal. He is Artie on metoprolol. Will add lisinopril 10 mg to his regimen.   Hyperlipidemia-not maximally controlled. Consider switching to atorvastatin. Recheck lipid and liver levels in 12 weeks.

## 2016-10-10 NOTE — Assessment & Plan Note (Signed)
Doing well after conservative measures, Flexeril and gabapentin seemed to help as well as prednisone, doing rehabilitation exercises through 4 times per week. Symptoms have resolved, initially he did have right-sided S1 distribution radiculopathy with previous MRIs that showed scarring around the S1 nerve root. Ultimately he is doing well so I can see him back as needed, should this recur we will again proceed with conservative measures before jumped MRI with and without IV contrast for interventional planning.

## 2016-10-14 ENCOUNTER — Other Ambulatory Visit: Payer: Self-pay | Admitting: Family Medicine

## 2016-10-31 DIAGNOSIS — Z85828 Personal history of other malignant neoplasm of skin: Secondary | ICD-10-CM | POA: Diagnosis not present

## 2016-10-31 DIAGNOSIS — Z08 Encounter for follow-up examination after completed treatment for malignant neoplasm: Secondary | ICD-10-CM | POA: Diagnosis not present

## 2016-10-31 DIAGNOSIS — L300 Nummular dermatitis: Secondary | ICD-10-CM | POA: Diagnosis not present

## 2016-11-10 ENCOUNTER — Other Ambulatory Visit: Payer: Self-pay | Admitting: Sports Medicine

## 2016-11-10 DIAGNOSIS — M5416 Radiculopathy, lumbar region: Secondary | ICD-10-CM

## 2016-11-27 ENCOUNTER — Other Ambulatory Visit: Payer: Self-pay | Admitting: Sports Medicine

## 2016-11-27 DIAGNOSIS — M5412 Radiculopathy, cervical region: Secondary | ICD-10-CM

## 2016-12-05 DIAGNOSIS — Z79891 Long term (current) use of opiate analgesic: Secondary | ICD-10-CM | POA: Diagnosis not present

## 2016-12-23 ENCOUNTER — Other Ambulatory Visit: Payer: Self-pay | Admitting: Family Medicine

## 2017-01-09 ENCOUNTER — Other Ambulatory Visit: Payer: Self-pay | Admitting: Family Medicine

## 2017-01-15 ENCOUNTER — Encounter: Payer: Self-pay | Admitting: Internal Medicine

## 2017-01-23 ENCOUNTER — Ambulatory Visit (INDEPENDENT_AMBULATORY_CARE_PROVIDER_SITE_OTHER): Payer: BLUE CROSS/BLUE SHIELD | Admitting: Family Medicine

## 2017-01-23 VITALS — BP 132/87 | HR 65 | Wt 205.0 lb

## 2017-01-23 DIAGNOSIS — I10 Essential (primary) hypertension: Secondary | ICD-10-CM

## 2017-01-23 LAB — BASIC METABOLIC PANEL WITH GFR
BUN: 24 mg/dL (ref 7–25)
CALCIUM: 9.3 mg/dL (ref 8.6–10.3)
CO2: 25 mmol/L (ref 20–31)
Chloride: 105 mmol/L (ref 98–110)
Creat: 1.14 mg/dL (ref 0.70–1.25)
GFR, EST AFRICAN AMERICAN: 80 mL/min (ref >=60)
GFR, EST NON AFRICAN AMERICAN: 70 mL/min (ref >=60)
Glucose, Bld: 94 mg/dL (ref 65–99)
Potassium: 4.5 mmol/L (ref 3.5–5.3)
SODIUM: 139 mmol/L (ref 135–146)

## 2017-01-23 MED ORDER — METOPROLOL SUCCINATE ER 100 MG PO TB24: 100.0000 mg | ORAL_TABLET | Freq: Every day | ORAL | 1 refills | Status: DC

## 2017-01-23 NOTE — Progress Notes (Signed)
Subjective:    CC: HTN, 3 month  HPI:  Hypertension- We added lisinopril to his regimen at last office visit to get better control of his blood pressure. He is here today for follow-up. Pt denies chest pain, SOB, dizziness, or heart palpitations.  Taking meds as directed w/o problems.  Denies medication side effects.      Past medical history, Surgical history, Family history not pertinant except as noted below, Social history, Allergies, and medications have been entered into the medical record, reviewed, and corrections made.   Review of Systems: No fevers, chills, night sweats, weight loss, chest pain, or shortness of breath.   Objective:    General: Well Developed, well nourished, and in no acute distress.  Neuro: Alert and oriented x3, extra-ocular muscles intact, sensation grossly intact.  HEENT: Normocephalic, atraumatic  Skin: Warm and dry, no rashes. Cardiac: Regular rate and rhythm, no murmurs rubs or gallops, no lower extremity edema.  Respiratory: Clear to auscultation bilaterally. Not using accessory muscles, speaking in full sentences.   Impression and Recommendations:    HTN - Well controlled. Continue current regimen. Follow up in  6 months. Due for BMP today since new start ACE.  RF metoprolol.

## 2017-01-24 NOTE — Progress Notes (Signed)
All labs are normal. 

## 2017-01-31 DIAGNOSIS — G8929 Other chronic pain: Secondary | ICD-10-CM | POA: Diagnosis not present

## 2017-01-31 DIAGNOSIS — M47816 Spondylosis without myelopathy or radiculopathy, lumbar region: Secondary | ICD-10-CM | POA: Diagnosis not present

## 2017-02-15 ENCOUNTER — Other Ambulatory Visit: Payer: Self-pay | Admitting: Sports Medicine

## 2017-02-15 DIAGNOSIS — M5416 Radiculopathy, lumbar region: Secondary | ICD-10-CM

## 2017-02-15 DIAGNOSIS — M5412 Radiculopathy, cervical region: Secondary | ICD-10-CM

## 2017-02-15 MED ORDER — GABAPENTIN 300 MG PO CAPS: ORAL_CAPSULE | ORAL | 3 refills | Status: DC

## 2017-02-15 MED ORDER — CELECOXIB 200 MG PO CAPS: 200.0000 mg | ORAL_CAPSULE | Freq: Every day | ORAL | 3 refills | Status: DC

## 2017-03-12 ENCOUNTER — Other Ambulatory Visit: Payer: Self-pay | Admitting: Family Medicine

## 2017-03-28 DIAGNOSIS — M47816 Spondylosis without myelopathy or radiculopathy, lumbar region: Secondary | ICD-10-CM | POA: Diagnosis not present

## 2017-03-28 DIAGNOSIS — G8929 Other chronic pain: Secondary | ICD-10-CM | POA: Diagnosis not present

## 2017-04-01 ENCOUNTER — Other Ambulatory Visit: Payer: Self-pay | Admitting: Family Medicine

## 2017-05-21 ENCOUNTER — Other Ambulatory Visit: Payer: Self-pay | Admitting: Family Medicine

## 2017-05-30 DIAGNOSIS — Z79899 Other long term (current) drug therapy: Secondary | ICD-10-CM | POA: Diagnosis not present

## 2017-05-30 DIAGNOSIS — M5030 Other cervical disc degeneration, unspecified cervical region: Secondary | ICD-10-CM | POA: Diagnosis not present

## 2017-05-30 DIAGNOSIS — M961 Postlaminectomy syndrome, not elsewhere classified: Secondary | ICD-10-CM | POA: Diagnosis not present

## 2017-05-30 DIAGNOSIS — M25561 Pain in right knee: Secondary | ICD-10-CM | POA: Diagnosis not present

## 2017-05-30 DIAGNOSIS — Z5181 Encounter for therapeutic drug level monitoring: Secondary | ICD-10-CM | POA: Diagnosis not present

## 2017-05-30 DIAGNOSIS — M79671 Pain in right foot: Secondary | ICD-10-CM | POA: Diagnosis not present

## 2017-06-19 DIAGNOSIS — M5412 Radiculopathy, cervical region: Secondary | ICD-10-CM | POA: Diagnosis not present

## 2017-06-19 DIAGNOSIS — M961 Postlaminectomy syndrome, not elsewhere classified: Secondary | ICD-10-CM | POA: Diagnosis not present

## 2017-06-19 DIAGNOSIS — M79671 Pain in right foot: Secondary | ICD-10-CM | POA: Diagnosis not present

## 2017-06-19 DIAGNOSIS — Z79899 Other long term (current) drug therapy: Secondary | ICD-10-CM | POA: Diagnosis not present

## 2017-06-19 DIAGNOSIS — M25561 Pain in right knee: Secondary | ICD-10-CM | POA: Diagnosis not present

## 2017-06-19 DIAGNOSIS — Z5181 Encounter for therapeutic drug level monitoring: Secondary | ICD-10-CM | POA: Diagnosis not present

## 2017-07-24 DIAGNOSIS — M961 Postlaminectomy syndrome, not elsewhere classified: Secondary | ICD-10-CM | POA: Diagnosis not present

## 2017-07-24 DIAGNOSIS — Z79899 Other long term (current) drug therapy: Secondary | ICD-10-CM | POA: Diagnosis not present

## 2017-07-24 DIAGNOSIS — M79671 Pain in right foot: Secondary | ICD-10-CM | POA: Diagnosis not present

## 2017-07-24 DIAGNOSIS — M25561 Pain in right knee: Secondary | ICD-10-CM | POA: Diagnosis not present

## 2017-07-24 DIAGNOSIS — M5412 Radiculopathy, cervical region: Secondary | ICD-10-CM | POA: Diagnosis not present

## 2017-07-24 DIAGNOSIS — Z5181 Encounter for therapeutic drug level monitoring: Secondary | ICD-10-CM | POA: Diagnosis not present

## 2017-08-09 DIAGNOSIS — Z23 Encounter for immunization: Secondary | ICD-10-CM | POA: Diagnosis not present

## 2017-08-12 ENCOUNTER — Other Ambulatory Visit: Payer: Self-pay | Admitting: Family Medicine

## 2017-08-29 DIAGNOSIS — H2000 Unspecified acute and subacute iridocyclitis: Secondary | ICD-10-CM | POA: Diagnosis not present

## 2017-08-30 DIAGNOSIS — M7741 Metatarsalgia, right foot: Secondary | ICD-10-CM | POA: Diagnosis not present

## 2017-08-30 DIAGNOSIS — M7742 Metatarsalgia, left foot: Secondary | ICD-10-CM | POA: Diagnosis not present

## 2017-08-30 DIAGNOSIS — R29898 Other symptoms and signs involving the musculoskeletal system: Secondary | ICD-10-CM | POA: Diagnosis not present

## 2017-08-31 ENCOUNTER — Telehealth: Payer: Self-pay | Admitting: Family Medicine

## 2017-08-31 DIAGNOSIS — I10 Essential (primary) hypertension: Secondary | ICD-10-CM

## 2017-08-31 DIAGNOSIS — K760 Fatty (change of) liver, not elsewhere classified: Secondary | ICD-10-CM

## 2017-08-31 DIAGNOSIS — Z125 Encounter for screening for malignant neoplasm of prostate: Secondary | ICD-10-CM

## 2017-08-31 NOTE — Telephone Encounter (Signed)
Please notify pt that order has been placed

## 2017-08-31 NOTE — Telephone Encounter (Signed)
Pt called. He wants lab order sent down  prior to his appt on 12/4 at 9:50. Thanks

## 2017-08-31 NOTE — Telephone Encounter (Signed)
Pt informed.Raymond Mitchell  

## 2017-09-03 DIAGNOSIS — Z125 Encounter for screening for malignant neoplasm of prostate: Secondary | ICD-10-CM | POA: Diagnosis not present

## 2017-09-03 DIAGNOSIS — K760 Fatty (change of) liver, not elsewhere classified: Secondary | ICD-10-CM | POA: Diagnosis not present

## 2017-09-03 DIAGNOSIS — I10 Essential (primary) hypertension: Secondary | ICD-10-CM | POA: Diagnosis not present

## 2017-09-03 NOTE — Progress Notes (Signed)
Subjective:    CC: BP, liver check   HPI:  Hypertension- Pt denies chest pain, SOB, dizziness, or heart palpitations.  Taking meds as directed w/o problems.  Denies medication side effects.    F/U Fatty liver - due for one year follow up on fatty liver. Has been working with bariatric clinic ot lose weight.    Lightheadedness - He also reports that been feeling lightheaded with position change for about 8 weeks up until about 3 days ago. He has felt much better these last couple of days.  Says when he would go from lying to sitting, from sitting to standing he would feel lightheaded for a minute or two.   Then right after Thanksgiving he started getting painin his right eye. Went to see his eye doc and was dx with a severe case of iritis. He is on drops.  Has already been seen 3 times and has a f/u later this week.    BP 137/89   Pulse (!) 58   Ht 6\' 2"  (1.88 m)   Wt 199 lb (90.3 kg)   SpO2 97%   BMI 25.55 kg/m     Allergies  Allergen Reactions  . Nsaids Other (See Comments)    Per gastroenterologist     Past Medical History:  Diagnosis Date  . Ankylosing spondylitis ([place])   . BPH (benign prostatic hyperplasia)   . Diverticulosis   . Fatty liver   . Pes planus   . Tobacco user   . Varicose veins     Past Surgical History:  Procedure Laterality Date  . c spine ESI     [name]  . INGUINAL HERNIA REPAIR  1994  . RTC surgery  2005    Social History   Socioeconomic History  . Marital status: Married    Spouse name: Not on file  . Number of children: Not on file  . Years of education: Not on file  . Highest education level: Not on file  Social Needs  . Financial resource strain: Not on file  . Food insecurity - worry: Not on file  . Food insecurity - inability: Not on file  . Transportation needs - medical: Not on file  . Transportation needs - non-medical: Not on file  Occupational History  . Not on file  Tobacco Use  . Smoking status: Current Some Day  Smoker    Packs/day: 1.00    Years: 25.00    Pack years: 25.00    Last attempt to quit: 12/28/2011    Years since quitting: 5.6  . Smokeless tobacco: Never Used  Substance and Sexual Activity  . Alcohol use: Yes    Alcohol/week: 10.5 oz    Types: 21 Standard drinks or equivalent per week    Comment: 3 drinks per day  . Drug use: No  . Sexual activity: Not on file  Other Topics Concern  . Not on file  Social History Narrative  . Not on file    Family History  Problem Relation Age of Onset  . Hyperlipidemia Mother   . Hypertension Mother   . Diabetes Mother   . Hyperlipidemia Father   . Hypertension Father   . Other Father        ank spondy  . Other Brother        Brain tumor, CVA  . Other Brother        ank spondy    Outpatient Encounter Medications as of 09/04/2017  Medication Sig  .  aspirin 81 MG tablet Take 81 mg by mouth daily.   Marland Kitchen atorvastatin (LIPITOR) 40 MG tablet Take 1 tablet (40 mg total) by mouth daily.  . Betamethasone Valerate 0.12 % foam APPLY 1 APPLICATION TOPICALLY 2 (TWO) TIMES DAILY.  . Calcium Carbonate-Vitamin D (CALTRATE 600+D) 600-400 MG-UNIT per tablet Take 1 tablet by mouth daily.    . celecoxib (CELEBREX) 200 MG capsule Take 1-2 capsules (200-400 mg total) by mouth daily.  . cyclobenzaprine (FLEXERIL) 10 MG tablet ONE HALF TAB BY MOUTH AT BEDTIME , THEN INCREASE GRADUALLY TO ONE TAB 3 TIMES A DAY .  Marland Kitchen cyclopentolate (CYCLODRYL,CYCLOGYL) 1 % ophthalmic solution Place 2 drops into the right eye once.  . fish oil-omega-3 fatty acids 1000 MG capsule Take by mouth. 2 capsules po bid    . gabapentin (NEURONTIN) 300 MG capsule 300mg  PO QD-TID  . HYDROcodone-acetaminophen (NORCO) 10-325 MG tablet   . ketoconazole (NIZORAL) 2 % shampoo DAILY FOR ONE WEEK, THEN 2-3 TIMES A WEEK FOR MAINTENANCE.  Marland Kitchen lisinopril (PRINIVIL,ZESTRIL) 10 MG tablet Take 1 tablet (10 mg total) by mouth daily.  . metoprolol succinate (TOPROL-XL) 100 MG 24 hr tablet Take 1 tablet  (100 mg total) daily by mouth. Due for follow up visit  . Misc. Devices MISC [employer] Shoe  . multivitamin [facility]) per tablet Take 1 tablet by mouth daily.   . Potassium 99 MG TABS Take 2 tablets by mouth daily.    . prednisoLONE acetate (PRED FORTE) 1 % ophthalmic suspension Place 1 drop into the right eye.  . tamsulosin (FLOMAX) 0.4 MG CAPS capsule TAKE 1 CAP BY MOUTH ONCE DAILY  . triamcinolone cream (KENALOG) 0.1 % APPLY TWICE A DAY AS NEEDED TO RASH/ITCHING   No facility-administered encounter medications on file as of 09/04/2017.       Objective:    General: Well Developed, well nourished, and in no acute distress.  Neuro: Alert and oriented x3, extra-ocular muscles intact, sensation grossly intact.  HEENT: Normocephalic, atraumatic no cervical lymphadenopathy.  No thyromegaly. Skin: Warm and dry, no rashes. Cardiac: Regular rate and rhythm, no murmurs rubs or gallops, no lower extremity edema.  No carotid bruits. Respiratory: Clear to auscultation bilaterally. Not using accessory muscles, speaking in full sentences. Neuro: Negative Dix-Hallpike maneuver, renal nerves II through XII intact, normal gait and station.  Impression and Recommendations:    HTN - Well controlled. Continue current regimen. Follow up in  6 months.   Fatty liver -due to recheck liver.    Lightheadedness-unclear etiology though I do think it is likely blood pressure related.  Did encourage him to hydrate well.  Is actually been feeling better over the last 2 days so we will just continue to monitor.  If it recurs then he will give [place] a call back.  Iritis-following closely with ophthalmology.

## 2017-09-03 NOTE — Telephone Encounter (Signed)
Thank you :)

## 2017-09-04 ENCOUNTER — Ambulatory Visit: Payer: BLUE CROSS/BLUE SHIELD | Admitting: Family Medicine

## 2017-09-04 ENCOUNTER — Encounter: Payer: Self-pay | Admitting: Family Medicine

## 2017-09-04 VITALS — BP 137/89 | HR 58 | Ht 74.0 in | Wt 199.0 lb

## 2017-09-04 DIAGNOSIS — R42 Dizziness and giddiness: Secondary | ICD-10-CM | POA: Diagnosis not present

## 2017-09-04 DIAGNOSIS — Z23 Encounter for immunization: Secondary | ICD-10-CM

## 2017-09-04 DIAGNOSIS — I10 Essential (primary) hypertension: Secondary | ICD-10-CM

## 2017-09-04 DIAGNOSIS — K760 Fatty (change of) liver, not elsewhere classified: Secondary | ICD-10-CM

## 2017-09-04 DIAGNOSIS — H209 Unspecified iridocyclitis: Secondary | ICD-10-CM

## 2017-09-04 LAB — COMPLETE METABOLIC PANEL WITH GFR
AG Ratio: 1.8 (calc) (ref 1.0–2.5)
ALBUMIN MSPROF: 4.1 g/dL (ref 3.6–5.1)
ALKALINE PHOSPHATASE (APISO): 63 U/L (ref 40–115)
ALT: 22 U/L (ref 9–46)
AST: 21 U/L (ref 10–35)
BILIRUBIN TOTAL: 0.6 mg/dL (ref 0.2–1.2)
BUN: 24 mg/dL (ref 7–25)
CHLORIDE: 105 mmol/L (ref 98–110)
CO2: 29 mmol/L (ref 20–32)
CREATININE: 1.19 mg/dL (ref 0.70–1.25)
Calcium: 9.1 mg/dL (ref 8.6–10.3)
GFR, Est African American: 76 mL/min/{1.73_m2} (ref >=60)
GFR, Est Non African American: 66 mL/min/{1.73_m2} (ref >=60)
GLUCOSE: 106 mg/dL — AB (ref 65–99)
Globulin: 2.3 g/dL (calc) (ref 1.9–3.7)
Potassium: 4.5 mmol/L (ref 3.5–5.3)
Sodium: 141 mmol/L (ref 135–146)
Total Protein: 6.4 g/dL (ref 6.1–8.1)

## 2017-09-04 LAB — CBC
HCT: 42.5 % (ref 38.5–50.0)
HEMOGLOBIN: 14.7 g/dL (ref 13.2–17.1)
MCH: 33.9 pg — AB (ref 27.0–33.0)
MCHC: 34.6 g/dL (ref 32.0–36.0)
MCV: 98.2 fL (ref 80.0–100.0)
MPV: 9.4 fL (ref 7.5–12.5)
Platelets: 250 10*3/uL (ref 140–400)
RBC: 4.33 10*6/uL (ref 4.20–5.80)
RDW: 11.5 % (ref 11.0–15.0)
WBC: 8.9 10*3/uL (ref 3.8–10.8)

## 2017-09-04 LAB — LIPID PANEL W/REFLEX DIRECT LDL
CHOL/HDL RATIO: 3.2 (calc) (ref <5.0)
CHOLESTEROL: 161 mg/dL (ref <200)
HDL: 50 mg/dL (ref >40)
LDL CHOLESTEROL (CALC): 93 mg/dL
NON-HDL CHOLESTEROL (CALC): 111 mg/dL (ref <130)
TRIGLYCERIDES: 87 mg/dL (ref <150)

## 2017-09-04 LAB — HEMOGLOBIN A1C
EAG (MMOL/L): 6 (calc)
HEMOGLOBIN A1C: 5.4 %{Hb} (ref <5.7)
MEAN PLASMA GLUCOSE: 108 (calc)

## 2017-09-04 LAB — PSA: PSA: 0.8 ng/mL (ref <=4.0)

## 2017-09-04 NOTE — Patient Instructions (Signed)
Please call us back if you feel like you are starting to get the lightheadedness episodes again.

## 2017-09-12 ENCOUNTER — Other Ambulatory Visit: Payer: Self-pay | Admitting: Family Medicine

## 2017-09-18 DIAGNOSIS — M79671 Pain in right foot: Secondary | ICD-10-CM | POA: Diagnosis not present

## 2017-09-18 DIAGNOSIS — G894 Chronic pain syndrome: Secondary | ICD-10-CM | POA: Diagnosis not present

## 2017-09-18 DIAGNOSIS — M5412 Radiculopathy, cervical region: Secondary | ICD-10-CM | POA: Diagnosis not present

## 2017-09-18 DIAGNOSIS — M961 Postlaminectomy syndrome, not elsewhere classified: Secondary | ICD-10-CM | POA: Diagnosis not present

## 2017-09-25 ENCOUNTER — Other Ambulatory Visit: Payer: Self-pay | Admitting: Family Medicine

## 2017-10-01 ENCOUNTER — Other Ambulatory Visit: Payer: Self-pay | Admitting: *Deleted

## 2017-10-01 MED ORDER — METOPROLOL SUCCINATE ER 100 MG PO TB24: 100.0000 mg | ORAL_TABLET | Freq: Every day | ORAL | 1 refills | Status: DC

## 2017-10-04 DIAGNOSIS — M7741 Metatarsalgia, right foot: Secondary | ICD-10-CM | POA: Diagnosis not present

## 2017-10-04 DIAGNOSIS — R29898 Other symptoms and signs involving the musculoskeletal system: Secondary | ICD-10-CM | POA: Diagnosis not present

## 2017-10-04 DIAGNOSIS — M7742 Metatarsalgia, left foot: Secondary | ICD-10-CM | POA: Diagnosis not present

## 2017-10-15 ENCOUNTER — Other Ambulatory Visit: Payer: Self-pay | Admitting: Family Medicine

## 2017-11-01 DIAGNOSIS — Z08 Encounter for follow-up examination after completed treatment for malignant neoplasm: Secondary | ICD-10-CM | POA: Diagnosis not present

## 2017-11-01 DIAGNOSIS — Z85828 Personal history of other malignant neoplasm of skin: Secondary | ICD-10-CM | POA: Diagnosis not present

## 2017-11-13 DIAGNOSIS — M961 Postlaminectomy syndrome, not elsewhere classified: Secondary | ICD-10-CM | POA: Diagnosis not present

## 2017-11-13 DIAGNOSIS — M25561 Pain in right knee: Secondary | ICD-10-CM | POA: Diagnosis not present

## 2017-11-13 DIAGNOSIS — M79671 Pain in right foot: Secondary | ICD-10-CM | POA: Diagnosis not present

## 2017-11-13 DIAGNOSIS — M5412 Radiculopathy, cervical region: Secondary | ICD-10-CM | POA: Diagnosis not present

## 2017-11-27 DIAGNOSIS — H04123 Dry eye syndrome of bilateral lacrimal glands: Secondary | ICD-10-CM | POA: Diagnosis not present

## 2017-11-27 DIAGNOSIS — H5203 Hypermetropia, bilateral: Secondary | ICD-10-CM | POA: Diagnosis not present

## 2017-11-27 DIAGNOSIS — H524 Presbyopia: Secondary | ICD-10-CM | POA: Diagnosis not present

## 2017-11-27 DIAGNOSIS — Z135 Encounter for screening for eye and ear disorders: Secondary | ICD-10-CM | POA: Diagnosis not present

## 2017-12-28 ENCOUNTER — Other Ambulatory Visit: Payer: Self-pay | Admitting: Family Medicine

## 2018-01-08 DIAGNOSIS — M25562 Pain in left knee: Secondary | ICD-10-CM | POA: Diagnosis not present

## 2018-01-08 DIAGNOSIS — M79671 Pain in right foot: Secondary | ICD-10-CM | POA: Diagnosis not present

## 2018-01-08 DIAGNOSIS — Z79899 Other long term (current) drug therapy: Secondary | ICD-10-CM | POA: Diagnosis not present

## 2018-01-08 DIAGNOSIS — M961 Postlaminectomy syndrome, not elsewhere classified: Secondary | ICD-10-CM | POA: Diagnosis not present

## 2018-01-08 DIAGNOSIS — Z5181 Encounter for therapeutic drug level monitoring: Secondary | ICD-10-CM | POA: Diagnosis not present

## 2018-01-08 DIAGNOSIS — M5412 Radiculopathy, cervical region: Secondary | ICD-10-CM | POA: Diagnosis not present

## 2018-01-08 DIAGNOSIS — M25561 Pain in right knee: Secondary | ICD-10-CM | POA: Diagnosis not present

## 2018-01-08 DIAGNOSIS — G8929 Other chronic pain: Secondary | ICD-10-CM | POA: Diagnosis not present

## 2018-01-14 ENCOUNTER — Telehealth: Payer: Self-pay

## 2018-01-14 NOTE — Telephone Encounter (Signed)
Raymond Mitchell has had diarrhea for 2 days. He has not taken any OTC anti-diarrhea medication. Denies vomiting, fever, chills or sweats. Advised patient to pick up Imodium, increase fluids and a BRAT diet. Also to call if persist or worsens.

## 2018-01-14 NOTE — Telephone Encounter (Signed)
Agree with below. Addendum:    I believe that she has limits with moving and including, toileting, bathing, feeding, dressing and grooming. I believe the power wheelchair is needed for pt to be able to perform ADL's in her home

## 2018-01-15 ENCOUNTER — Ambulatory Visit (INDEPENDENT_AMBULATORY_CARE_PROVIDER_SITE_OTHER): Payer: BLUE CROSS/BLUE SHIELD | Admitting: Family Medicine

## 2018-01-15 ENCOUNTER — Telehealth: Payer: Self-pay

## 2018-01-15 ENCOUNTER — Encounter: Payer: Self-pay | Admitting: Family Medicine

## 2018-01-15 VITALS — BP 106/73 | HR 80 | Temp 98.2°F | Ht 72.0 in | Wt 191.0 lb

## 2018-01-15 DIAGNOSIS — E861 Hypovolemia: Secondary | ICD-10-CM | POA: Diagnosis not present

## 2018-01-15 DIAGNOSIS — R197 Diarrhea, unspecified: Secondary | ICD-10-CM | POA: Diagnosis not present

## 2018-01-15 DIAGNOSIS — I9589 Other hypotension: Secondary | ICD-10-CM | POA: Diagnosis not present

## 2018-01-15 DIAGNOSIS — K52832 Lymphocytic colitis: Secondary | ICD-10-CM | POA: Diagnosis not present

## 2018-01-15 HISTORY — DX: Lymphocytic colitis: K52.832

## 2018-01-15 NOTE — Progress Notes (Signed)
Raymond Mitchell is a 62 y.o. male who presents to [facility] today for diarrhea.   The patient reports that on Saturday he was feeling weak and constipated. He took a laxative Saturday night and then subsequently woke up early Sunday morning and has been having diarrhea frequently since. He notes watery stools every 3 hours or so. He denies seeing blood or mucus in his stool. He has no ill contacts. He denies vomiting. He denies severe abdominal pain. He has taken imodium with little relief. He is still taking hydrocodone for his chronic pain as prescribed. He is compliant with his other medications as well. He states he has been drinking Gatorade to help prevent dehydration.  He does think it is getting a bit better with his last stool about 6 hours ago.   His PMH is significant for lymphocytic colitis seen on colonoscopy a few years ago currently in remission without any specific treatment.  He notes his current symptoms are not consistent with episodes of colitis.  Additionally he denies any recent sick contacts and no other family members with similar symptoms.  He denies any new medications.    Past Medical History:  Diagnosis Date  . Ankylosing spondylitis ([place])   . BPH (benign prostatic hyperplasia)   . Diverticulosis   . Fatty liver   . Lymphocytic colitis 01/15/2018  . Pes planus   . Tobacco user   . Varicose veins    Past Surgical History:  Procedure Laterality Date  . c spine ESI     [name]  . INGUINAL HERNIA REPAIR  1994  . RTC surgery  2005   Social History   Tobacco Use  . Smoking status: Current Some Day Smoker    Packs/day: 1.00    Years: 25.00    Pack years: 25.00    Last attempt to quit: 12/28/2011    Years since quitting: 6.0  . Smokeless tobacco: Never Used  Substance Use Topics  . Alcohol use: Yes    Alcohol/week: 10.5 oz   Types: 21 Standard drinks or equivalent per week    Comment: 3 drinks per day   family history includes Diabetes in his mother; Hyperlipidemia in his father and mother; Hypertension in his father and mother; Other in his brother, brother, and father.  ROS as above:  Medications: Current Outpatient Medications  Medication Sig Dispense Refill  . aspirin 81 MG tablet Take 81 mg by mouth daily.     Marland Kitchen atorvastatin (LIPITOR) 40 MG tablet TAKE 1 TABLET (40 MG TOTAL) BY MOUTH DAILY. 90 tablet 3  . Betamethasone Valerate 0.12 % foam APPLY 1 APPLICATION TOPICALLY 2 (TWO) TIMES DAILY. 100 g 0  . Calcium Carbonate-Vitamin D (CALTRATE 600+D) 600-400 MG-UNIT per tablet Take 1 tablet by mouth daily.      . celecoxib (CELEBREX) 200 MG capsule Take 1-2 capsules (200-400 mg total) by mouth daily. 180 capsule 3  . cyclobenzaprine (FLEXERIL) 10 MG tablet ONE HALF TAB BY MOUTH AT BEDTIME , THEN INCREASE GRADUALLY TO ONE TAB 3 TIMES A DAY . 30 tablet 0  . cyclopentolate (CYCLODRYL,CYCLOGYL) 1 % ophthalmic solution Place 2 drops into the right eye once.    . fish oil-omega-3 fatty acids 1000 MG capsule Take by mouth. 2 capsules po bid      . gabapentin (NEURONTIN) 300 MG capsule 300mg  PO QD-TID 270 capsule 3  . HYDROcodone-acetaminophen (NORCO) 10-325 MG tablet  0  . ketoconazole (NIZORAL) 2 % shampoo DAILY FOR ONE WEEK, THEN 2-3 TIMES A WEEK FOR MAINTENANCE. 240 mL 2  . lisinopril (PRINIVIL,ZESTRIL) 10 MG tablet TAKE 1 TABLET (10 MG TOTAL) BY MOUTH DAILY. 90 tablet 3  . metoprolol succinate (TOPROL-XL) 100 MG 24 hr tablet Take 1 tablet (100 mg total) by mouth daily. 90 tablet 1  . Misc. Devices MISC [employer] Shoe    . multivitamin [facility]) per tablet Take 1 tablet by mouth daily.     . Potassium 99 MG TABS Take 2 tablets by mouth daily.      . prednisoLONE acetate (PRED FORTE) 1 % ophthalmic suspension Place 1 drop into the right eye.    . tamsulosin (FLOMAX) 0.4 MG CAPS capsule TAKE 1 CAP BY MOUTH  ONCE DAILY 90 capsule 0  . triamcinolone cream (KENALOG) 0.1 % APPLY TWICE A DAY AS NEEDED TO RASH/ITCHING  1   No current facility-administered medications for this visit.    Allergies  Allergen Reactions  . Nsaids Other (See Comments)    Per gastroenterologist     Health Maintenance Health Maintenance  Topic Date Due  . HIV Screening  01/05/1971  . INFLUENZA VACCINE  08/31/2018 (Originally 05/02/2018)  . TETANUS/TDAP  05/29/2021  . COLONOSCOPY  11/17/2024  . Hepatitis C Screening  Completed     Exam:  BP 106/73   Pulse 80   Temp 98.2 F (36.8 C) (Oral)   Ht 6' (1.829 m)   Wt 191 lb (86.6 kg)   BMI 25.90 kg/m  Gen: Well NAD non-toxic appearing HEENT: EOMI,  MMM Lungs: Normal work of breathing. CTABL Heart: RRR no MRG Abd: NABS, Soft. Nondistended, Nontender Exts: Brisk capillary refill, warm and well perfused.    No results found for this or any previous visit (from the past 72 hour(s)). No results found.    Assessment and Plan: 62 y.o. male with diarrhea.   This patient's diarrhea is most likely viral gastroenteritis in nature. However, due to a history of lymphocytic colitis, we want to be more aggressive in ruling out other more serious problems on his differential. We will do a stool culture to rule out bacterial gastroenteritis. Lipase labs will rule out pancreatitis. CBC will establish the severity of this infection. Plan to continue imodium and watchful waiting.   The patient's blood pressure is low today at 106/73. This is likely due to dehydration. Will hold lisinopril for a few days and check CMP.Marland Kitchen   Next step if not better in 2 days would be to consider CT scan and referral to gastroenterology.    Orders Placed This Encounter  Procedures  . Stool Culture  . CBC  . COMPLETE METABOLIC PANEL WITH GFR  . Lipase   No orders of the defined types were placed in this encounter.    Discussed warning signs or symptoms. Please see discharge  instructions. Patient expresses understanding.

## 2018-01-15 NOTE — Patient Instructions (Addendum)
Thank you for coming in today. Hold the lisinopril for tonight and tomorrow.  Get blood work and stool test tomorrow.  Continue imodium as needed.  Pepto bismol can help as well.  Check blood pressure.  Once it starts to get high in the 130s or 140s we can resume lisinopril.   Follow up as needed with me or Dr Madilyn Fireman if not getting better.    Diarrhea, Adult Diarrhea is frequent loose and watery bowel movements. Diarrhea can make you feel weak and cause you to become dehydrated. Dehydration can make you tired and thirsty, cause you to have a dry mouth, and decrease how often you urinate. Diarrhea typically lasts 2-3 days. However, it can last longer if it is a sign of something more serious. It is important to treat your diarrhea as told by your health care provider. Follow these instructions at home: Eating and drinking  Follow these recommendations as told by your health care provider:  Take an oral rehydration solution (ORS). This is a drink that is sold at pharmacies and retail stores.  Drink clear fluids, such as water, ice chips, diluted fruit juice, and low-calorie sports drinks.  Eat bland, easy-to-digest foods in small amounts as you are able. These foods include bananas, applesauce, rice, lean meats, toast, and crackers.  Avoid drinking fluids that contain a lot of sugar or caffeine, such as energy drinks, sports drinks, and soda.  Avoid alcohol.  Avoid spicy or fatty foods.  General instructions  Drink enough fluid to keep your urine clear or pale yellow.  Wash your hands often. If soap and water are not available, use hand sanitizer.  Make sure that all people in your household wash their hands well and often.  Take over-the-counter and prescription medicines only as told by your health care provider.  Rest at home while you recover.  Watch your condition for any changes.  Take a warm bath to relieve any burning or pain from frequent diarrhea  episodes.  Keep all follow-up visits as told by your health care provider. This is important. Contact a health care provider if:  You have a fever.  Your diarrhea gets worse.  You have new symptoms.  You cannot keep fluids down.  You feel light-headed or dizzy.  You have a headache  You have muscle cramps. Get help right away if:  You have chest pain.  You feel extremely weak or you faint.  You have bloody or black stools or stools that look like tar.  You have severe pain, cramping, or bloating in your abdomen.  You have trouble breathing or you are breathing very quickly.  Your heart is beating very quickly.  Your skin feels cold and clammy.  You feel confused.  You have signs of dehydration, such as: ? Dark urine, very little urine, or no urine. ? Cracked lips. ? Dry mouth. ? Sunken eyes. ? Sleepiness. ? Weakness. This information is not intended to replace advice given to you by your health care provider. Make sure you discuss any questions you have with your health care provider. Document Released: 09/08/2002 Document Revised: 01/27/2016 Document Reviewed: 05/25/2015 Elsevier Interactive Patient Education  [employer].

## 2018-01-16 DIAGNOSIS — R197 Diarrhea, unspecified: Secondary | ICD-10-CM | POA: Diagnosis not present

## 2018-01-16 LAB — CBC
HCT: 46.4 % (ref 38.5–50.0)
HEMOGLOBIN: 16.2 g/dL (ref 13.2–17.1)
MCH: 33.7 pg — ABNORMAL HIGH (ref 27.0–33.0)
MCHC: 34.9 g/dL (ref 32.0–36.0)
MCV: 96.5 fL (ref 80.0–100.0)
MPV: 9.7 fL (ref 7.5–12.5)
Platelets: 230 10*3/uL (ref 140–400)
RBC: 4.81 10*6/uL (ref 4.20–5.80)
RDW: 11.9 % (ref 11.0–15.0)
WBC: 8.2 10*3/uL (ref 3.8–10.8)

## 2018-01-16 LAB — COMPLETE METABOLIC PANEL WITH GFR
AG Ratio: 1.9 (calc) (ref 1.0–2.5)
ALKALINE PHOSPHATASE (APISO): 70 U/L (ref 40–115)
ALT: 20 U/L (ref 9–46)
AST: 22 U/L (ref 10–35)
Albumin: 4 g/dL (ref 3.6–5.1)
BUN/Creatinine Ratio: 22 (calc) (ref 6–22)
BUN: 26 mg/dL — AB (ref 7–25)
CALCIUM: 8.9 mg/dL (ref 8.6–10.3)
CO2: 26 mmol/L (ref 20–32)
CREATININE: 1.2 mg/dL (ref 0.70–1.25)
Chloride: 107 mmol/L (ref 98–110)
GFR, EST NON AFRICAN AMERICAN: 65 mL/min/{1.73_m2} (ref >=60)
GFR, Est African American: 75 mL/min/{1.73_m2} (ref >=60)
GLUCOSE: 94 mg/dL (ref 65–99)
Globulin: 2.1 g/dL (calc) (ref 1.9–3.7)
Potassium: 4.1 mmol/L (ref 3.5–5.3)
SODIUM: 137 mmol/L (ref 135–146)
Total Bilirubin: 0.4 mg/dL (ref 0.2–1.2)
Total Protein: 6.1 g/dL (ref 6.1–8.1)

## 2018-01-16 LAB — LIPASE: LIPASE: 14 U/L (ref 7–60)

## 2018-01-17 DIAGNOSIS — R197 Diarrhea, unspecified: Secondary | ICD-10-CM | POA: Diagnosis not present

## 2018-01-21 LAB — STOOL CULTURE
MICRO NUMBER: 90478350
MICRO NUMBER: 90478351
MICRO NUMBER: 90478352
SHIGA RESULT:: NOT DETECTED
SPECIMEN QUALITY: ADEQUATE
SPECIMEN QUALITY: ADEQUATE
SPECIMEN QUALITY:: ADEQUATE

## 2018-02-08 ENCOUNTER — Other Ambulatory Visit: Payer: Self-pay | Admitting: Family Medicine

## 2018-02-14 ENCOUNTER — Other Ambulatory Visit: Payer: Self-pay | Admitting: Sports Medicine

## 2018-02-14 DIAGNOSIS — M5412 Radiculopathy, cervical region: Secondary | ICD-10-CM

## 2018-02-15 NOTE — Telephone Encounter (Signed)
No additional encounter note

## 2018-02-24 ENCOUNTER — Other Ambulatory Visit: Payer: Self-pay | Admitting: Sports Medicine

## 2018-02-24 DIAGNOSIS — M5412 Radiculopathy, cervical region: Secondary | ICD-10-CM

## 2018-03-01 ENCOUNTER — Other Ambulatory Visit: Payer: Self-pay | Admitting: Sports Medicine

## 2018-03-01 DIAGNOSIS — M5416 Radiculopathy, lumbar region: Secondary | ICD-10-CM

## 2018-03-05 DIAGNOSIS — M961 Postlaminectomy syndrome, not elsewhere classified: Secondary | ICD-10-CM | POA: Diagnosis not present

## 2018-03-05 DIAGNOSIS — G894 Chronic pain syndrome: Secondary | ICD-10-CM | POA: Diagnosis not present

## 2018-03-05 DIAGNOSIS — M79671 Pain in right foot: Secondary | ICD-10-CM | POA: Diagnosis not present

## 2018-03-05 DIAGNOSIS — M5412 Radiculopathy, cervical region: Secondary | ICD-10-CM | POA: Diagnosis not present

## 2018-03-27 ENCOUNTER — Other Ambulatory Visit: Payer: Self-pay | Admitting: Family Medicine

## 2018-04-17 ENCOUNTER — Other Ambulatory Visit: Payer: Self-pay | Admitting: Family Medicine

## 2018-04-26 ENCOUNTER — Encounter: Payer: Self-pay | Admitting: Emergency Medicine

## 2018-04-26 ENCOUNTER — Emergency Department
Admission: EM | Admit: 2018-04-26 | Discharge: 2018-04-26 | Disposition: A | Discharge status: Home / Self Care | Payer: BLUE CROSS/BLUE SHIELD | Source: Home / Self Care | Attending: Family Medicine | Admitting: Family Medicine

## 2018-04-26 DIAGNOSIS — B9789 Other viral agents as the cause of diseases classified elsewhere: Secondary | ICD-10-CM

## 2018-04-26 DIAGNOSIS — J069 Acute upper respiratory infection, unspecified: Secondary | ICD-10-CM

## 2018-04-26 MED ORDER — IPRATROPIUM BROMIDE 0.06 % NA SOLN: 2.0000 | Freq: Four times a day (QID) | NASAL | 1 refills | Status: DC

## 2018-04-26 MED ORDER — PSEUDOEPHEDRINE HCL ER 120 MG PO TB12: 120.0000 mg | ORAL_TABLET | Freq: Two times a day (BID) | ORAL | 0 refills | Status: DC | PRN

## 2018-04-26 NOTE — ED Provider Notes (Signed)
[facility]    CSN: [id number] Arrival date & time: 04/26/18  1400     History   Chief Complaint Chief Complaint  Patient presents with  . Nasal Congestion    HPI Raymond Mitchell is a 62 y.o. male.   HPI Raymond Mitchell is a 62 y.o. male presenting to UC with c/o 1 days nasal congestion, mild cough, sinus pressure and post-nasal drip. He tried 1 dose of Mucinex w/o relief. He has not tried anything else for his symptoms. Denies fever, chills, n/v/d. Others at work have been sick but he was out all week on vacation. No other known sick contacts.    Past Medical History:  Diagnosis Date  . Ankylosing spondylitis ([place])   . BPH (benign prostatic hyperplasia)   . Diverticulosis   . Fatty liver   . Lymphocytic colitis 01/15/2018  . Pes planus   . Tobacco user   . Varicose veins     Patient Active Problem List   Diagnosis Date Noted  . Lymphocytic colitis 01/15/2018  . Right lumbar radiculopathy 09/12/2016  . Hepatic cyst 11/11/2014  . Cancer of skin, squamous cell 03/11/2014  . Seborrheic dermatitis 09/05/2012  . SCC (squamous cell carcinoma), leg 08/29/2011  . Left cervical radiculopathy 01/02/2011  . POSTURAL LIGHTHEADEDNESS 12/02/2010  . CIGARETTE SMOKER 11/29/2010  . Fatty liver 10/08/2009  . CONSTIPATION, DRUG INDUCED 10/05/2009  . DIVERTICULOSIS OF COLON 08/13/2009  . [place] DISEASE, LUMBAR 07/08/2007  . [place] DISEASE, CERVICAL 03/11/2007  . BPH (benign prostatic hyperplasia) 02/13/2007  . Hyperlipidemia 01/16/2007  . ANKYLOSING SPONDYLITIS 12/05/2006  . Essential hypertension, benign 12/05/2006    Past Surgical History:  Procedure Laterality Date  . c spine ESI     [name]  . INGUINAL HERNIA REPAIR  1994  . RTC surgery  2005       Home Medications    Prior to Admission medications   Medication Sig Start Date End Date Taking? Authorizing Provider  aspirin 81 MG tablet Take 81 mg by mouth daily.     [provider]    atorvastatin (LIPITOR) 40 MG tablet TAKE 1 TABLET (40 MG TOTAL) BY MOUTH DAILY. 09/26/17   Hali Marry, MD  Betamethasone Valerate 0.12 % foam APPLY 1 APPLICATION TOPICALLY 2 (TWO) TIMES DAILY. 02/08/18   Hali Marry, MD  Calcium Carbonate-Vitamin D (CALTRATE 600+D) 600-400 MG-UNIT per tablet Take 1 tablet by mouth daily.      [provider]  celecoxib (CELEBREX) 200 MG capsule Take 1-2 capsules (200-400 mg total) by mouth daily. Needs appt with Dr T for further refills 02/14/18   Silverio Decamp, MD  celecoxib (CELEBREX) 200 MG capsule TAKE 1-2 CAPSULES (200-400 MG TOTAL) BY MOUTH DAILY. 02/25/18   Silverio Decamp, MD  cyclobenzaprine (FLEXERIL) 10 MG tablet ONE HALF TAB BY MOUTH AT BEDTIME , THEN INCREASE GRADUALLY TO ONE TAB 3 TIMES A DAY . 11/12/16   Silverio Decamp, MD  cyclopentolate (CYCLODRYL,CYCLOGYL) 1 % ophthalmic solution Place 2 drops into the right eye once.    [provider]  fish oil-omega-3 fatty acids 1000 MG capsule Take by mouth. 2 capsules po bid      [provider]  gabapentin (NEURONTIN) 300 MG capsule TAKE ONE CAPSULE BY MOUTH 1 TO 3 TIMES DAILY 03/01/18   Silverio Decamp, MD  HYDROcodone-acetaminophen [facility] 10-325 MG tablet  09/03/17   [provider]  ipratropium (ATROVENT) 0.06 % nasal spray Place 2  sprays into both nostrils 4 (four) times daily. 04/26/18   Noe Gens, PA-C  ketoconazole (NIZORAL) 2 % shampoo DAILY FOR ONE WEEK, THEN 2-3 TIMES A WEEK FOR MAINTENANCE. 02/08/18   Hali Marry, MD  lisinopril (PRINIVIL,ZESTRIL) 10 MG tablet TAKE 1 TABLET (10 MG TOTAL) BY MOUTH DAILY. 09/26/17   Hali Marry, MD  metoprolol succinate (TOPROL-XL) 100 MG 24 hr tablet TAKE 1 TABLET BY MOUTH EVERY DAY 04/17/18   Hali Marry, MD  Misc. Devices MISC [employer] Shoe 08/30/17   [provider]  multivitamin [facility]) per tablet Take 1 tablet by mouth daily.      [provider]  Potassium 99 MG TABS Take 2 tablets by mouth daily.      [provider]  prednisoLONE acetate (PRED FORTE) 1 % ophthalmic suspension Place 1 drop into the right eye.    [provider]  pseudoephedrine (SUDAFED 12 HOUR) 120 MG 12 hr tablet Take 1 tablet (120 mg total) by mouth every 12 (twelve) hours as needed for congestion. 04/26/18   Noe Gens, PA-C  tamsulosin (FLOMAX) 0.4 MG CAPS capsule TAKE 1 CAP BY MOUTH ONCE DAILY 03/27/18   Hali Marry, MD  triamcinolone cream (KENALOG) 0.1 % APPLY TWICE A DAY AS NEEDED TO RASH/ITCHING 11/28/15   [provider]    Family History Family History  Problem Relation Age of Onset  . Hyperlipidemia Mother   . Hypertension Mother   . Diabetes Mother   . Hyperlipidemia Father   . Hypertension Father   . Other Father        ank spondy  . Other Brother        Brain tumor, CVA  . Other Brother        ank spondy    Social History Social History   Tobacco Use  . Smoking status: Current Some Day Smoker    Packs/day: 1.00    Years: 25.00    Pack years: 25.00    Last attempt to quit: 12/28/2011    Years since quitting: 6.3  . Smokeless tobacco: Never Used  Substance Use Topics  . Alcohol use: Yes    Alcohol/week: 12.6 oz    Types: 21 Standard drinks or equivalent per week    Comment: 3 drinks per day  . Drug use: No     Allergies   Nsaids   Review of Systems Review of Systems  Constitutional: Negative for chills and fever.  HENT: Positive for congestion, postnasal drip and sinus pressure. Negative for ear pain, sinus pain, sore throat, trouble swallowing and voice change.   Respiratory: Positive for cough. Negative for shortness of breath.   Cardiovascular: Negative for chest pain and palpitations.  Gastrointestinal: Negative for abdominal pain, diarrhea, nausea and vomiting.  Musculoskeletal: Negative for arthralgias, back pain and myalgias.  Skin: Negative for rash.       Physical Exam Triage Vital Signs ED Triage Vitals [04/26/18 1432]  Enc Vitals Group     BP (!) 148/80     Pulse Rate 78     Resp      Temp 97.8 F (36.6 C)     Temp src      SpO2 97 %     Weight 197 lb (89.4 kg)     Height      Head Circumference      Peak Flow      Pain Score 0     Pain Loc  Pain Edu?      Excl. in [place]?    No data found.  Updated Vital Signs BP (!) 148/80 (BP Location: Right Arm)   Pulse 78   Temp 97.8 F (36.6 C)   Wt 197 lb (89.4 kg)   SpO2 97%   BMI 26.72 kg/m   Visual Acuity Right Eye Distance:   Left Eye Distance:   Bilateral Distance:    Right Eye Near:   Left Eye Near:    Bilateral Near:     Physical Exam  Constitutional: He is oriented to person, place, and time. He appears well-developed and well-nourished. No distress.  HENT:  Head: Normocephalic and atraumatic.  Right Ear: Tympanic membrane normal.  Left Ear: Tympanic membrane normal.  Nose: Nose normal. Right sinus exhibits no maxillary sinus tenderness and no frontal sinus tenderness. Left sinus exhibits no maxillary sinus tenderness and no frontal sinus tenderness.  Mouth/Throat: Uvula is midline, oropharynx is clear and moist and mucous membranes are normal.  Eyes: EOM are normal.  Neck: Normal range of motion. Neck supple.  Cardiovascular: Normal rate and regular rhythm.  Pulmonary/Chest: Effort normal and breath sounds normal. No stridor. No respiratory distress. He has no wheezes. He has no rales.  Musculoskeletal: Normal range of motion.  Lymphadenopathy:    He has no cervical adenopathy.  Neurological: He is alert and oriented to person, place, and time.  Skin: Skin is warm and dry. He is not diaphoretic.  Psychiatric: He has a normal mood and affect. His behavior is normal.  Nursing note and vitals reviewed.    UC Treatments / Results  Labs (all labs ordered are listed, but only abnormal results are displayed) Labs Reviewed - No data to  display  EKG None  Radiology No results found.  Procedures Procedures (including critical care time)  Medications Ordered in UC Medications - No data to display  Initial Impression / Assessment and Plan / UC Course  I have reviewed the triage vital signs and the nursing notes.  Pertinent labs & imaging results that were available during my care of the patient were reviewed by me and considered in my medical decision making (see chart for details).     Hx and exam c/w viral illness. No antibiotics indicated at this time.  Final Clinical Impressions(s) / UC Diagnoses   Final diagnoses:  Viral URI with cough     Discharge Instructions      For the Ipratropium nasal spray, be sure to use as prescribed to help prevent post-nasal drip, which can trigger coughing, especially at night.  Use 2 sprays per nostril 4 times daily while sick.  You should spray one time in each nostril pointing the spray to the out portion of your nostril, breath in slowly while spraying. Wait about 30 seconds to 1 minute before given the second spray in each nostril.  This will ensure you get the most benefit from each spray.    You may take Tylenol (acetaminophen) 500mg  every 4-6 hours as needed for pain. Please follow up with Dr. Madilyn Fireman in 7-10 days if not improving, sooner if significantly worsening.    ED Prescriptions    Medication Sig Dispense Auth. Provider   ipratropium (ATROVENT) 0.06 % nasal spray Place 2 sprays into both nostrils 4 (four) times daily. 15 mL Gerarda Fraction, Laquita Harlan O, PA-C   pseudoephedrine (SUDAFED 12 HOUR) 120 MG 12 hr tablet Take 1 tablet (120 mg total) by mouth every 12 (twelve) hours as needed for congestion.  14 tablet Noe Gens, [place]     Controlled Substance Prescriptions [place] Controlled Substance Registry consulted? Not Applicable   Tyrell Antonio 04/26/18 1611

## 2018-04-26 NOTE — ED Triage Notes (Signed)
Pt c/o nasal congestion, cough with mucous and sinus pressure since yesterday.

## 2018-04-26 NOTE — Discharge Instructions (Signed)
°  For the Ipratropium nasal spray, be sure to use as prescribed to help prevent post-nasal drip, which can trigger coughing, especially at night.  Use 2 sprays per nostril 4 times daily while sick.  You should spray one time in each nostril pointing the spray to the out portion of your nostril, breath in slowly while spraying. Wait about 30 seconds to 1 minute before given the second spray in each nostril.  This will ensure you get the most benefit from each spray.    You may take Tylenol (acetaminophen) 500mg  every 4-6 hours as needed for pain. Please follow up with Dr. Madilyn Fireman in 7-10 days if not improving, sooner if significantly worsening.

## 2018-04-30 DIAGNOSIS — M5412 Radiculopathy, cervical region: Secondary | ICD-10-CM | POA: Diagnosis not present

## 2018-04-30 DIAGNOSIS — M961 Postlaminectomy syndrome, not elsewhere classified: Secondary | ICD-10-CM | POA: Diagnosis not present

## 2018-04-30 DIAGNOSIS — G894 Chronic pain syndrome: Secondary | ICD-10-CM | POA: Diagnosis not present

## 2018-04-30 DIAGNOSIS — M79671 Pain in right foot: Secondary | ICD-10-CM | POA: Diagnosis not present

## 2018-05-16 ENCOUNTER — Other Ambulatory Visit: Payer: Self-pay | Admitting: Family Medicine

## 2018-05-30 ENCOUNTER — Ambulatory Visit: Payer: BLUE CROSS/BLUE SHIELD | Admitting: Family Medicine

## 2018-05-30 ENCOUNTER — Encounter: Payer: Self-pay | Admitting: Family Medicine

## 2018-05-30 VITALS — BP 123/70 | HR 72 | Ht 72.0 in | Wt 201.0 lb

## 2018-05-30 DIAGNOSIS — K7689 Other specified diseases of liver: Secondary | ICD-10-CM

## 2018-05-30 DIAGNOSIS — R1031 Right lower quadrant pain: Secondary | ICD-10-CM | POA: Diagnosis not present

## 2018-05-30 NOTE — Progress Notes (Signed)
Subjective:    Patient ID: Raymond Mitchell, male    DOB: December 07, 1955, 62 y.o.   MRN: [id number]  HPI He reports a right sided groin pain for several months but his pain was severe this morning so called.  He says it is a burning pain.  No change in bowels are diarrhea. Had Abdomen/pelvis CT in 11/2014 just showing some hepatic cyst.  No urinary symptoms.  He is actually had pain in that right groin area on and off for about 6 to 9 months.  He said when it would bother him it would be more mild to moderate discomfort it would usually stop him from doing anything it would last for about 10 minutes.  But over the last couple of weeks it has increased in intensity and has lasted as long as 30 minutes.  But then this morning his pain actually lasted for almost 3 hours.  It has eased off some.  He has noticed that he is having a little bit more difficult time having a bowel movement he Artie takes 2 stool softeners a day.  No blood in the stool.  No fevers chills or sweats.  He does have a history of colitis that has been a little bit nervous about taking a laxative.    He does report having some type of hernia repair back around 1995 somewhere in [place].   Review of Systems  BP 123/70   Pulse 72   Ht 6' (1.829 m)   Wt 201 lb (91.2 kg)   SpO2 98%   BMI 27.26 kg/m     Allergies  Allergen Reactions  . Nsaids Other (See Comments)    Per gastroenterologist     Past Medical History:  Diagnosis Date  . Ankylosing spondylitis ([place])   . BPH (benign prostatic hyperplasia)   . Diverticulosis   . Fatty liver   . Lymphocytic colitis 01/15/2018  . Pes planus   . Tobacco user   . Varicose veins     Past Surgical History:  Procedure Laterality Date  . c spine ESI     [name]  . INGUINAL HERNIA REPAIR  1994  . RTC surgery  2005    Social History   Socioeconomic History  . Marital status: Married    Spouse name: Not on file  . Number of children: Not on file  . Years of  education: Not on file  . Highest education level: Not on file  Occupational History  . Not on file  Social Needs  . Financial resource strain: Not on file  . Food insecurity:    Worry: Not on file    Inability: Not on file  . Transportation needs:    Medical: Not on file    Non-medical: Not on file  Tobacco Use  . Smoking status: Current Some Day Smoker    Packs/day: 1.00    Years: 25.00    Pack years: 25.00    Last attempt to quit: 12/28/2011    Years since quitting: 6.4  . Smokeless tobacco: Never Used  Substance and Sexual Activity  . Alcohol use: Yes    Alcohol/week: 21.0 standard drinks    Types: 21 Standard drinks or equivalent per week    Comment: 3 drinks per day  . Drug use: No  . Sexual activity: Not on file  Lifestyle  . Physical activity:    Days per week: Not on file    Minutes per session: Not on file  .  Stress: Not on file  Relationships  . Social connections:    Talks on phone: Not on file    Gets together: Not on file    Attends religious service: Not on file    Active member of club or organization: Not on file    Attends meetings of clubs or organizations: Not on file    Relationship status: Not on file  . Intimate partner violence:    Fear of current or ex partner: Not on file    Emotionally abused: Not on file    Physically abused: Not on file    Forced sexual activity: Not on file  Other Topics Concern  . Not on file  Social History Narrative  . Not on file    Family History  Problem Relation Age of Onset  . Hyperlipidemia Mother   . Hypertension Mother   . Diabetes Mother   . Hyperlipidemia Father   . Hypertension Father   . Other Father        ank spondy  . Other Brother        Brain tumor, CVA  . Other Brother        ank spondy    Outpatient Encounter Medications as of 05/30/2018  Medication Sig  . aspirin 81 MG tablet Take 81 mg by mouth daily.   Marland Kitchen atorvastatin (LIPITOR) 40 MG tablet TAKE 1 TABLET (40 MG TOTAL) BY MOUTH  DAILY.  Marland Kitchen Betamethasone Valerate 0.12 % foam APPLY 1 APPLICATION TOPICALLY 2 (TWO) TIMES DAILY.  . Calcium Carbonate-Vitamin D (CALTRATE 600+D) 600-400 MG-UNIT per tablet Take 1 tablet by mouth daily.    . celecoxib (CELEBREX) 200 MG capsule TAKE 1-2 CAPSULES (200-400 MG TOTAL) BY MOUTH DAILY.  . cyclobenzaprine (FLEXERIL) 10 MG tablet ONE HALF TAB BY MOUTH AT BEDTIME , THEN INCREASE GRADUALLY TO ONE TAB 3 TIMES A DAY .  Marland Kitchen cyclopentolate (CYCLODRYL,CYCLOGYL) 1 % ophthalmic solution Place 2 drops into the right eye once.  . fish oil-omega-3 fatty acids 1000 MG capsule Take by mouth. 2 capsules po bid    . gabapentin (NEURONTIN) 300 MG capsule TAKE ONE CAPSULE BY MOUTH 1 TO 3 TIMES DAILY  . HYDROcodone-acetaminophen (NORCO) 10-325 MG tablet   . ipratropium (ATROVENT) 0.06 % nasal spray Place 2 sprays into both nostrils 4 (four) times daily.  Marland Kitchen ketoconazole (NIZORAL) 2 % shampoo DAILY FOR ONE WEEK, THEN 2-3 TIMES A WEEK FOR MAINTENANCE.  Marland Kitchen lisinopril (PRINIVIL,ZESTRIL) 10 MG tablet TAKE 1 TABLET (10 MG TOTAL) BY MOUTH DAILY.  . metoprolol succinate (TOPROL-XL) 100 MG 24 hr tablet TAKE 1 TABLET BY MOUTH EVERY DAY  . Misc. Devices MISC [employer] Shoe  . multivitamin [facility]) per tablet Take 1 tablet by mouth daily.   . Potassium 99 MG TABS Take 2 tablets by mouth daily.    . prednisoLONE acetate (PRED FORTE) 1 % ophthalmic suspension Place 1 drop into the right eye.  . pseudoephedrine (SUDAFED 12 HOUR) 120 MG 12 hr tablet Take 1 tablet (120 mg total) by mouth every 12 (twelve) hours as needed for congestion.  . tamsulosin (FLOMAX) 0.4 MG CAPS capsule TAKE 1 CAP BY MOUTH ONCE DAILY  . triamcinolone cream (KENALOG) 0.1 % APPLY TWICE A DAY AS NEEDED TO RASH/ITCHING  . [DISCONTINUED] celecoxib (CELEBREX) 200 MG capsule Take 1-2 capsules (200-400 mg total) by mouth daily. Needs appt with Dr T for further refills   No facility-administered encounter medications on file as of 05/30/2018.  Objective:   Physical Exam  Constitutional: He is oriented to person, place, and time. He appears well-developed and well-nourished.  HENT:  Head: Normocephalic and atraumatic.  Eyes: Conjunctivae and EOM are normal.  Cardiovascular: Normal rate.  Pulmonary/Chest: Effort normal.  Musculoskeletal:  He has a bulge just above the right groin crease on the right side.  There is some palpable firm tissue underneath it is quite tender to touch.  He actually has an incision on the left groin going just above the suprapubic area.  Neurological: He is alert and oriented to person, place, and time.  Skin: Skin is dry. No pallor.  Psychiatric: He has a normal mood and affect. His behavior is normal.  Vitals reviewed.       Assessment & Plan:  Right groin pain-on exam likely herniation.  He does have a significant bulge on the right just above the groin crease.  Will refer to general surgery for further evaluation.  He actually has an old surgical scar on the left side.  We did discuss that avoiding things like heavy lifting and working on getting his bowels moving a little bit more consistently.  He can either add MiraLAX at bedtime or try increasing his stool softener to 3 times a day.  If his pain becomes severe and is not going away then let us know immediately or go to the emergency department.  Will refer to [facility] surgery in [place] for consultation.  Hepatic cyst-at some point we actually need to follow this up with probably an ultrasound for further evaluation.  Note on the CT in 2016 noting that the liver lesions look larger than on previous imaging.  We will schedule for ultrasound once he has been evaluated for the groin hernia.

## 2018-06-11 ENCOUNTER — Ambulatory Visit: Payer: Self-pay | Admitting: General Surgery

## 2018-06-11 DIAGNOSIS — K4090 Unilateral inguinal hernia, without obstruction or gangrene, not specified as recurrent: Secondary | ICD-10-CM | POA: Diagnosis not present

## 2018-06-11 NOTE — H&P (Signed)
  History of Present Illness Ralene Ok MD; 06/11/2018 2:12 PM) The patient is a 62 year old male who presents with an inguinal hernia. Referred by: Dr. Minna Merritts Chief Complaint: Right inguinal hernia  Patient is a 62 year old male with a history of chronic pain secondary to ankylosing spondylitis, hypertension, who comes in with a one-year history of a right inguinal hernia. Patient states that he's had some pain most recently over the last week that required him to visit with his PCP. At that time it was confirmed right inguinal hernia. He has had 2 for previous occasions of pain as well. Patient has had a previous left open inguinal hernia in the 90s.  He's had no signs or symptoms of incarceration or strangulation.  Patient does have a pain contract for his hydrocodone.     Allergies Lindwood Coke, RN; 06/11/2018 1:59 PM) No Known Drug Allergies [06/11/2018]: Allergies Reconciled   Medication History (Diane Herrin, RN; 06/11/2018 2:02 PM) HYDROcodone-Acetaminophen (10-325MG  Tablet, Oral) Active. Atorvastatin Calcium (40MG  Tablet, Oral) Active. Gabapentin (300MG  Capsule, Oral) Active. Lisinopril (10MG  Tablet, Oral) Active. Metoprolol Succinate ER (100MG  Tablet ER 24HR, Oral) Active. Tamsulosin HCl (0.4MG  Capsule, Oral) Active. Celecoxib (200MG  Capsule, Oral) Active. Medications Reconciled    Review of Systems Ralene Ok, MD; 06/11/2018 2:13 PM) All other systems negative  Vitals (Diane Herrin RN; 06/11/2018 2:03 PM) 06/11/2018 2:02 PM Weight: 200.6 lb Height: 73in Body Surface Area: 2.15 m Body Mass Index: 26.47 kg/m  Temp.: 97.62F(Oral)  Pulse: 73 (Regular)  P.OX: 97% (Room air) BP: 110/70 (Sitting, Left Arm, Standard)       Physical Exam Ralene Ok MD; 06/11/2018 2:12 PM) The physical exam findings are as follows: Note:Constitutional: No acute distress, conversant, appears stated age  Eyes: Anicteric sclerae,  moist conjunctiva, no lid lag  Neck: No thyromegaly, trachea midline, no cervical lymphadenopathy  Lungs: Clear to auscultation biilaterally, normal respiratory effot  Cardiovascular: regular rate & rhythm, no murmurs, no peripheal edema, pedal pulses 2+  GI: Soft, no masses or hepatosplenomegaly, non-tender to palpation  MSK: Normal gait, no clubbing cyanosis, edema  Skin: No rashes, palpation reveals normal skin turgor  Psychiatric: Appropriate judgment and insight, oriented to person, place, and time  Abdomen Inspection Hernias - Inguinal hernia - Right - Reducible.    Assessment & Plan Ralene Ok MD; 06/11/2018 2:13 PM) RIGHT INGUINAL HERNIA (K40.90) Impression: 62 year old male with a right inguinal hernia  1. The patient will like to proceed to the operating room for laparoscopic right inguinal hernia repair with mesh.  2. I discussed with the patient the signs and symptoms of incarceration and strangulation and the need to proceed to the ER should they occur.  3. I discussed with the patient the risks and benefits of the procedure to include but not limited to: Infection, bleeding, damage to surrounding structures, possible need for further surgery, possible nerve pain, and possible recurrence. The patient was understanding and wishes to proceed.

## 2018-06-23 ENCOUNTER — Other Ambulatory Visit: Payer: Self-pay | Admitting: Family Medicine

## 2018-06-25 DIAGNOSIS — M5412 Radiculopathy, cervical region: Secondary | ICD-10-CM | POA: Diagnosis not present

## 2018-06-25 DIAGNOSIS — M79671 Pain in right foot: Secondary | ICD-10-CM | POA: Diagnosis not present

## 2018-06-25 DIAGNOSIS — M961 Postlaminectomy syndrome, not elsewhere classified: Secondary | ICD-10-CM | POA: Diagnosis not present

## 2018-06-25 DIAGNOSIS — G894 Chronic pain syndrome: Secondary | ICD-10-CM | POA: Diagnosis not present

## 2018-08-01 ENCOUNTER — Other Ambulatory Visit: Payer: Self-pay | Admitting: Family Medicine

## 2018-08-02 ENCOUNTER — Other Ambulatory Visit (HOSPITAL_COMMUNITY): Payer: BLUE CROSS/BLUE SHIELD

## 2018-08-09 ENCOUNTER — Ambulatory Visit: Admit: 2018-08-09 | Payer: BLUE CROSS/BLUE SHIELD | Admitting: General Surgery

## 2018-08-09 SURGERY — REPAIR, HERNIA, INGUINAL, LAPAROSCOPIC
Anesthesia: General | Site: Groin | Laterality: Right

## 2018-08-22 DIAGNOSIS — M79671 Pain in right foot: Secondary | ICD-10-CM | POA: Diagnosis not present

## 2018-08-22 DIAGNOSIS — M961 Postlaminectomy syndrome, not elsewhere classified: Secondary | ICD-10-CM | POA: Diagnosis not present

## 2018-08-22 DIAGNOSIS — Z5181 Encounter for therapeutic drug level monitoring: Secondary | ICD-10-CM | POA: Diagnosis not present

## 2018-08-22 DIAGNOSIS — Z79899 Other long term (current) drug therapy: Secondary | ICD-10-CM | POA: Diagnosis not present

## 2018-08-22 DIAGNOSIS — M5412 Radiculopathy, cervical region: Secondary | ICD-10-CM | POA: Diagnosis not present

## 2018-08-22 DIAGNOSIS — G894 Chronic pain syndrome: Secondary | ICD-10-CM | POA: Diagnosis not present

## 2018-09-11 ENCOUNTER — Other Ambulatory Visit: Payer: Self-pay | Admitting: Family Medicine

## 2018-09-19 ENCOUNTER — Other Ambulatory Visit: Payer: Self-pay | Admitting: Family Medicine

## 2018-10-15 ENCOUNTER — Other Ambulatory Visit: Payer: Self-pay | Admitting: Family Medicine

## 2018-10-18 DIAGNOSIS — M79671 Pain in right foot: Secondary | ICD-10-CM | POA: Diagnosis not present

## 2018-10-18 DIAGNOSIS — G894 Chronic pain syndrome: Secondary | ICD-10-CM | POA: Diagnosis not present

## 2018-10-18 DIAGNOSIS — M961 Postlaminectomy syndrome, not elsewhere classified: Secondary | ICD-10-CM | POA: Diagnosis not present

## 2018-10-18 DIAGNOSIS — M5412 Radiculopathy, cervical region: Secondary | ICD-10-CM | POA: Diagnosis not present

## 2018-11-05 DIAGNOSIS — Z85828 Personal history of other malignant neoplasm of skin: Secondary | ICD-10-CM | POA: Diagnosis not present

## 2018-11-05 DIAGNOSIS — C44619 Basal cell carcinoma of skin of left upper limb, including shoulder: Secondary | ICD-10-CM | POA: Diagnosis not present

## 2018-11-05 DIAGNOSIS — D485 Neoplasm of uncertain behavior of skin: Secondary | ICD-10-CM | POA: Diagnosis not present

## 2018-11-05 DIAGNOSIS — C4441 Basal cell carcinoma of skin of scalp and neck: Secondary | ICD-10-CM | POA: Diagnosis not present

## 2018-11-05 DIAGNOSIS — Z08 Encounter for follow-up examination after completed treatment for malignant neoplasm: Secondary | ICD-10-CM | POA: Diagnosis not present

## 2018-11-05 DIAGNOSIS — C441191 Basal cell carcinoma of skin of left upper eyelid, including canthus: Secondary | ICD-10-CM | POA: Diagnosis not present

## 2018-11-05 DIAGNOSIS — L820 Inflamed seborrheic keratosis: Secondary | ICD-10-CM | POA: Diagnosis not present

## 2018-11-09 ENCOUNTER — Other Ambulatory Visit: Payer: Self-pay | Admitting: Family Medicine

## 2018-11-26 ENCOUNTER — Ambulatory Visit: Payer: BLUE CROSS/BLUE SHIELD | Admitting: Family Medicine

## 2018-11-26 ENCOUNTER — Encounter: Payer: Self-pay | Admitting: Family Medicine

## 2018-11-26 VITALS — BP 142/85 | HR 66 | Ht 73.0 in | Wt 210.0 lb

## 2018-11-26 DIAGNOSIS — E785 Hyperlipidemia, unspecified: Secondary | ICD-10-CM | POA: Diagnosis not present

## 2018-11-26 DIAGNOSIS — H8112 Benign paroxysmal vertigo, left ear: Secondary | ICD-10-CM | POA: Insufficient documentation

## 2018-11-26 DIAGNOSIS — I10 Essential (primary) hypertension: Secondary | ICD-10-CM

## 2018-11-26 DIAGNOSIS — J301 Allergic rhinitis due to pollen: Secondary | ICD-10-CM | POA: Diagnosis not present

## 2018-11-26 DIAGNOSIS — Z720 Tobacco use: Secondary | ICD-10-CM | POA: Diagnosis not present

## 2018-11-26 DIAGNOSIS — J3489 Other specified disorders of nose and nasal sinuses: Secondary | ICD-10-CM

## 2018-11-26 MED ORDER — IPRATROPIUM BROMIDE 0.03 % NA SOLN: 2.0000 | Freq: Two times a day (BID) | NASAL | 12 refills | Status: DC

## 2018-11-26 NOTE — Progress Notes (Signed)
Subjective:    CC: BP check  HPI:  Hypertension- Pt denies chest pain, SOB, dizziness, or heart palpitations.  Taking meds as directed w/o problems.  Denies medication side effects.  He is going to the gym 3 to 4 days/week.  And he feels like overall he eats pretty healthy.  Hyperlipidemia -currently on atorvastatin 40 mg daily.  Tolerating well without any side effects or myalgias.  He reports he has been having episodes of feeling a little lightheaded.  He says he notices it more when he raises his head and looks up.  Or if he is up in bed too quickly or sometimes when sitting in his recliner at night and he turns to the left to reach for something.  He says it almost feels like his vertigo that he has had previously.  He says he denies feeling like he is going to pass out.  In regards to allergic rhinitis.  He did start daily Claritin back in the fall and says that has been helpful.  But now he gets this intermittent runny nose.  He says it happens most days at some point where he will just bend over to do something and it will literally drip out of his nose.  It has been clear.  Tobacco abuse-they are no longer allowed to smoke in the shop at work so he has been adding back.  Past medical history, Surgical history, Family history not pertinant except as noted below, Social history, Allergies, and medications have been entered into the medical record, reviewed, and corrections made.   Review of Systems: No fevers, chills, night sweats, weight loss, chest pain, or shortness of breath.   Objective:    General: Well Developed, well nourished, and in no acute distress.  Neuro: Alert and oriented x3, extra-ocular muscles intact, sensation grossly intact.  Positive Dix-Hallpike maneuver to the left with nystagmus. HEENT: Normocephalic, atraumatic  Skin: Warm and dry, no rashes. Cardiac: Regular rate and rhythm, no murmurs rubs or gallops, no lower extremity edema.  Respiratory: Clear to  auscultation bilaterally. Not using accessory muscles, speaking in full sentences.    Impression and Recommendations:    HTN -initial blood pressure elevated repeat was slightly improved.  BP still elevated so will recheck in 6 weeks.  For updated blood work.  He admits he also needs to work on some weight loss even though he has been working out regularly he has gained some weight.  For CMP and lipid panel.  Hyperlipidemia-due to recheck lipids.  Continue with daily statin.  Positional vertigo-discussed treatment options.  Recommend home physical therapy.  If not improving then recommend more formal PT.  He says he is not sure if his insurance will cover it.  Rhinitis -it sounds like he has had significant improvement with congestion with Claritin but is now having the opposite problem of rhinorrhea.  We will try Atrovent nasal spray.  Tobacco abuse-encourage cessation.  He says he has cut back and I encouraged him to continue to work at it.

## 2018-11-27 DIAGNOSIS — I10 Essential (primary) hypertension: Secondary | ICD-10-CM | POA: Diagnosis not present

## 2018-11-27 DIAGNOSIS — E785 Hyperlipidemia, unspecified: Secondary | ICD-10-CM | POA: Diagnosis not present

## 2018-11-27 LAB — COMPLETE METABOLIC PANEL WITH GFR
AG Ratio: 2 (calc) (ref 1.0–2.5)
ALT: 27 U/L (ref 9–46)
AST: 26 U/L (ref 10–35)
Albumin: 4 g/dL (ref 3.6–5.1)
Alkaline phosphatase (APISO): 64 U/L (ref 35–144)
BUN: 24 mg/dL (ref 7–25)
CO2: 28 mmol/L (ref 20–32)
Calcium: 9.3 mg/dL (ref 8.6–10.3)
Chloride: 107 mmol/L (ref 98–110)
Creat: 1.2 mg/dL (ref 0.70–1.25)
GFR, Est African American: 75 mL/min/{1.73_m2} (ref >=60)
GFR, Est Non African American: 65 mL/min/{1.73_m2} (ref >=60)
Globulin: 2 g/dL (calc) (ref 1.9–3.7)
Glucose, Bld: 103 mg/dL — ABNORMAL HIGH (ref 65–99)
Potassium: 4.3 mmol/L (ref 3.5–5.3)
Sodium: 142 mmol/L (ref 135–146)
Total Bilirubin: 0.5 mg/dL (ref 0.2–1.2)
Total Protein: 6 g/dL — ABNORMAL LOW (ref 6.1–8.1)

## 2018-11-27 LAB — CBC
HCT: 41.8 % (ref 38.5–50.0)
Hemoglobin: 14.5 g/dL (ref 13.2–17.1)
MCH: 33.9 pg — ABNORMAL HIGH (ref 27.0–33.0)
MCHC: 34.7 g/dL (ref 32.0–36.0)
MCV: 97.7 fL (ref 80.0–100.0)
MPV: 9.9 fL (ref 7.5–12.5)
PLATELETS: 231 10*3/uL (ref 140–400)
RBC: 4.28 10*6/uL (ref 4.20–5.80)
RDW: 11.9 % (ref 11.0–15.0)
WBC: 7.7 10*3/uL (ref 3.8–10.8)

## 2018-11-27 LAB — LIPID PANEL
CHOL/HDL RATIO: 3.7 (calc) (ref <5.0)
Cholesterol: 145 mg/dL (ref <200)
HDL: 39 mg/dL — ABNORMAL LOW (ref >=40)
LDL Cholesterol (Calc): 87 mg/dL (calc)
Non-HDL Cholesterol (Calc): 106 mg/dL (calc) (ref <130)
TRIGLYCERIDES: 96 mg/dL (ref <150)

## 2018-12-06 ENCOUNTER — Other Ambulatory Visit: Payer: Self-pay | Admitting: Family Medicine

## 2018-12-10 DIAGNOSIS — C44619 Basal cell carcinoma of skin of left upper limb, including shoulder: Secondary | ICD-10-CM | POA: Diagnosis not present

## 2018-12-10 DIAGNOSIS — C4441 Basal cell carcinoma of skin of scalp and neck: Secondary | ICD-10-CM | POA: Diagnosis not present

## 2018-12-11 ENCOUNTER — Other Ambulatory Visit: Payer: Self-pay

## 2018-12-11 ENCOUNTER — Ambulatory Visit (INDEPENDENT_AMBULATORY_CARE_PROVIDER_SITE_OTHER): Payer: BLUE CROSS/BLUE SHIELD | Admitting: Family Medicine

## 2018-12-11 VITALS — BP 129/73 | HR 62

## 2018-12-11 DIAGNOSIS — I10 Essential (primary) hypertension: Secondary | ICD-10-CM | POA: Diagnosis not present

## 2018-12-11 NOTE — Progress Notes (Signed)
Pt came into clinic today for BP check. Reports taking his lisinopril and metoprolol at night, no negative side effects. Denies chest pain, SOB, headaches, dizziness. BP today at goal. No changes. Advised to follow up with PCP as directed for his 6 month check. Verbalized understanding. No further questions.

## 2018-12-11 NOTE — Progress Notes (Signed)
Agree with documentation as above.   Ezri Landers, MD  

## 2018-12-12 ENCOUNTER — Other Ambulatory Visit: Payer: Self-pay | Admitting: Sports Medicine

## 2018-12-12 DIAGNOSIS — M5416 Radiculopathy, lumbar region: Secondary | ICD-10-CM

## 2018-12-12 MED ORDER — GABAPENTIN 300 MG PO CAPS: ORAL_CAPSULE | ORAL | 2 refills | Status: DC

## 2018-12-13 DIAGNOSIS — G894 Chronic pain syndrome: Secondary | ICD-10-CM | POA: Diagnosis not present

## 2018-12-13 DIAGNOSIS — M79671 Pain in right foot: Secondary | ICD-10-CM | POA: Diagnosis not present

## 2018-12-13 DIAGNOSIS — M961 Postlaminectomy syndrome, not elsewhere classified: Secondary | ICD-10-CM | POA: Diagnosis not present

## 2018-12-13 DIAGNOSIS — M5412 Radiculopathy, cervical region: Secondary | ICD-10-CM | POA: Diagnosis not present

## 2018-12-15 ENCOUNTER — Other Ambulatory Visit: Payer: Self-pay | Admitting: Family Medicine

## 2018-12-28 ENCOUNTER — Other Ambulatory Visit: Payer: Self-pay | Admitting: Family Medicine

## 2019-01-09 ENCOUNTER — Other Ambulatory Visit: Payer: Self-pay | Admitting: Family Medicine

## 2019-02-07 DIAGNOSIS — M79671 Pain in right foot: Secondary | ICD-10-CM | POA: Diagnosis not present

## 2019-02-07 DIAGNOSIS — M5412 Radiculopathy, cervical region: Secondary | ICD-10-CM | POA: Diagnosis not present

## 2019-02-07 DIAGNOSIS — M961 Postlaminectomy syndrome, not elsewhere classified: Secondary | ICD-10-CM | POA: Diagnosis not present

## 2019-02-07 DIAGNOSIS — G894 Chronic pain syndrome: Secondary | ICD-10-CM | POA: Diagnosis not present

## 2019-03-11 ENCOUNTER — Other Ambulatory Visit: Payer: Self-pay | Admitting: Sports Medicine

## 2019-03-11 DIAGNOSIS — M5412 Radiculopathy, cervical region: Secondary | ICD-10-CM

## 2019-03-16 ENCOUNTER — Other Ambulatory Visit: Payer: Self-pay | Admitting: Sports Medicine

## 2019-03-16 DIAGNOSIS — M5412 Radiculopathy, cervical region: Secondary | ICD-10-CM

## 2019-03-19 ENCOUNTER — Other Ambulatory Visit: Payer: Self-pay | Admitting: Sports Medicine

## 2019-03-24 DIAGNOSIS — Z5181 Encounter for therapeutic drug level monitoring: Secondary | ICD-10-CM | POA: Diagnosis not present

## 2019-03-24 DIAGNOSIS — Z79899 Other long term (current) drug therapy: Secondary | ICD-10-CM | POA: Diagnosis not present

## 2019-03-24 DIAGNOSIS — G894 Chronic pain syndrome: Secondary | ICD-10-CM | POA: Diagnosis not present

## 2019-03-24 DIAGNOSIS — M5412 Radiculopathy, cervical region: Secondary | ICD-10-CM | POA: Diagnosis not present

## 2019-03-24 DIAGNOSIS — M961 Postlaminectomy syndrome, not elsewhere classified: Secondary | ICD-10-CM | POA: Diagnosis not present

## 2019-03-24 DIAGNOSIS — M79671 Pain in right foot: Secondary | ICD-10-CM | POA: Diagnosis not present

## 2019-04-06 ENCOUNTER — Other Ambulatory Visit: Payer: Self-pay | Admitting: Family Medicine

## 2019-04-11 ENCOUNTER — Other Ambulatory Visit: Payer: Self-pay | Admitting: Family Medicine

## 2019-04-25 ENCOUNTER — Other Ambulatory Visit: Payer: Self-pay | Admitting: Family Medicine

## 2019-04-29 DIAGNOSIS — D485 Neoplasm of uncertain behavior of skin: Secondary | ICD-10-CM | POA: Diagnosis not present

## 2019-04-29 DIAGNOSIS — C44311 Basal cell carcinoma of skin of nose: Secondary | ICD-10-CM | POA: Diagnosis not present

## 2019-04-29 DIAGNOSIS — Z85828 Personal history of other malignant neoplasm of skin: Secondary | ICD-10-CM | POA: Diagnosis not present

## 2019-04-29 DIAGNOSIS — Z08 Encounter for follow-up examination after completed treatment for malignant neoplasm: Secondary | ICD-10-CM | POA: Diagnosis not present

## 2019-04-29 DIAGNOSIS — D2239 Melanocytic nevi of other parts of face: Secondary | ICD-10-CM | POA: Diagnosis not present

## 2019-05-20 DIAGNOSIS — G894 Chronic pain syndrome: Secondary | ICD-10-CM | POA: Diagnosis not present

## 2019-05-20 DIAGNOSIS — M79671 Pain in right foot: Secondary | ICD-10-CM | POA: Diagnosis not present

## 2019-05-20 DIAGNOSIS — M961 Postlaminectomy syndrome, not elsewhere classified: Secondary | ICD-10-CM | POA: Diagnosis not present

## 2019-05-20 DIAGNOSIS — M5412 Radiculopathy, cervical region: Secondary | ICD-10-CM | POA: Diagnosis not present

## 2019-05-27 ENCOUNTER — Ambulatory Visit: Payer: BLUE CROSS/BLUE SHIELD | Admitting: Family Medicine

## 2019-05-28 ENCOUNTER — Encounter: Payer: Self-pay | Admitting: Family Medicine

## 2019-05-28 ENCOUNTER — Ambulatory Visit (INDEPENDENT_AMBULATORY_CARE_PROVIDER_SITE_OTHER): Payer: BLUE CROSS/BLUE SHIELD | Admitting: Family Medicine

## 2019-05-28 ENCOUNTER — Other Ambulatory Visit: Payer: Self-pay

## 2019-05-28 VITALS — BP 133/77 | HR 64 | Temp 98.4°F | Wt 203.0 lb

## 2019-05-28 DIAGNOSIS — Z23 Encounter for immunization: Secondary | ICD-10-CM

## 2019-05-28 DIAGNOSIS — R0789 Other chest pain: Secondary | ICD-10-CM

## 2019-05-28 DIAGNOSIS — E785 Hyperlipidemia, unspecified: Secondary | ICD-10-CM

## 2019-05-28 DIAGNOSIS — K449 Diaphragmatic hernia without obstruction or gangrene: Secondary | ICD-10-CM

## 2019-05-28 DIAGNOSIS — R7309 Other abnormal glucose: Secondary | ICD-10-CM

## 2019-05-28 DIAGNOSIS — I10 Essential (primary) hypertension: Secondary | ICD-10-CM | POA: Diagnosis not present

## 2019-05-28 DIAGNOSIS — R7301 Impaired fasting glucose: Secondary | ICD-10-CM | POA: Insufficient documentation

## 2019-05-28 DIAGNOSIS — N400 Enlarged prostate without lower urinary tract symptoms: Secondary | ICD-10-CM

## 2019-05-28 LAB — POCT GLYCOSYLATED HEMOGLOBIN (HGB A1C): Hemoglobin A1C: 5.7 % — AB (ref 4.0–5.6)

## 2019-05-28 NOTE — Assessment & Plan Note (Signed)
ON PPI, helped initially but symptoms have bc more persistant. Will refer to GI for further eval. May benefit from endoscopy, etc.

## 2019-05-28 NOTE — Assessment & Plan Note (Signed)
Well controlled. Continue current regimen. Follow up in  6 mo  

## 2019-05-28 NOTE — Assessment & Plan Note (Signed)
Continue current regimen

## 2019-05-28 NOTE — Assessment & Plan Note (Signed)
Continue daily statin. 

## 2019-05-28 NOTE — Patient Instructions (Signed)
Placing referral to GI to get you evaluated.  Get a try to schedule you with Dr. Bryan Lemma in [facility].

## 2019-05-28 NOTE — Assessment & Plan Note (Signed)
A1C up from previous but still on the low end. Make sure working on healthy diet and exercise.

## 2019-05-28 NOTE — Progress Notes (Signed)
Established Patient Office Visit  Subjective:  Patient ID: Raymond Mitchell, male    DOB: [birth year]  Age: 63 y.o. MRN: [id number]  CC:  Chief Complaint  Patient presents with  . Hypertension  . abnormal glucose    HPI Raymond Mitchell presents for   Hypertension- Pt denies, SOB, dizziness, or heart palpitations.  Taking meds as directed w/o problems.  Denies medication side effects.  He exercises regularly working out about 5 to 7 days/week with walking and using the Bowflex.  F/U BPH - currently on flomax.    F/U abnormal glucose -asymptomatic.  He says he does urinate a lot but usually is because he drinks a lot of water daily.  He did want to discuss his hiatal hernia.  He has had problems on and off for years but more recently it has become more persistent and episodes of pain have been much longer.  He says he really has not had problems like that in the most 30 years.  He says it started bothering him maybe a couple months ago after he started over-the-counter Claritin.  He noticed that he was sort of getting these episodes of getting a bad taste in his mouth and then feeling some mid chest pain.  He said he started Nexium which she had used before for his hiatal hernia and says it did help some but then over the last 3 weeks it has become more persistent he has had 2 to 3 days/week where he has had discomfort that can sometimes last for hours and then he says it for just feels tired afterwards.  He is continue to take the Nexium and the Claritin.  He says he drinks lots of water.  He says in the past a lot of times when it was start to feel uncomfortable he would just increase his water intake and that always seem to help.  He is also more recently tried Tums but is not getting significant relief with that.  Past Medical History:  Diagnosis Date  . Ankylosing spondylitis ([place])   . BPH (benign prostatic hyperplasia)   . Diverticulosis   . Fatty liver   . Lymphocytic colitis  01/15/2018  . Pes planus   . Tobacco user   . Varicose veins     Past Surgical History:  Procedure Laterality Date  . c spine ESI     [name]  . INGUINAL HERNIA REPAIR  1994  . RTC surgery  2005    Family History  Problem Relation Age of Onset  . Hyperlipidemia Mother   . Hypertension Mother   . Diabetes Mother   . Hyperlipidemia Father   . Hypertension Father   . Other Father        ank spondy  . Other Brother        Brain tumor, CVA  . Other Brother        ank spondy    Social History   Socioeconomic History  . Marital status: Married    Spouse name: Not on file  . Number of children: Not on file  . Years of education: Not on file  . Highest education level: Not on file  Occupational History  . Not on file  Social Needs  . Financial resource strain: Not on file  . Food insecurity    Worry: Not on file    Inability: Not on file  . Transportation needs    Medical: Not on file    Non-medical:  Not on file  Tobacco Use  . Smoking status: Current Some Day Smoker    Packs/day: 1.00    Years: 25.00    Pack years: 25.00    Last attempt to quit: 12/28/2011    Years since quitting: 7.4  . Smokeless tobacco: Never Used  Substance and Sexual Activity  . Alcohol use: Yes    Alcohol/week: 21.0 standard drinks    Types: 21 Standard drinks or equivalent per week    Comment: 3 drinks per day  . Drug use: No  . Sexual activity: Not on file  Lifestyle  . Physical activity    Days per week: Not on file    Minutes per session: Not on file  . Stress: Not on file  Relationships  . Social Herbalist on phone: Not on file    Gets together: Not on file    Attends religious service: Not on file    Active member of club or organization: Not on file    Attends meetings of clubs or organizations: Not on file    Relationship status: Not on file  . Intimate partner violence    Fear of current or ex partner: Not on file    Emotionally abused: Not on file     Physically abused: Not on file    Forced sexual activity: Not on file  Other Topics Concern  . Not on file  Social History Narrative  . Not on file    Outpatient Medications Prior to Visit  Medication Sig Dispense Refill  . aspirin 81 MG tablet Take 81 mg by mouth daily.     Marland Kitchen atorvastatin (LIPITOR) 40 MG tablet Take 1 tablet (40 mg total) by mouth daily. 90 tablet 3  . Calcium Carbonate-Vitamin D (CALTRATE 600+D) 600-400 MG-UNIT per tablet Take 1 tablet by mouth daily.      . celecoxib (CELEBREX) 200 MG capsule TAKE 1-2 CAPSULES (200-400 MG TOTAL) BY MOUTH DAILY. 180 capsule 3  . Esomeprazole Magnesium (NEXIUM PO) Take by mouth.    . fish oil-omega-3 fatty acids 1000 MG capsule Take by mouth. 2 capsules po bid      . gabapentin (NEURONTIN) 300 MG capsule TAKE ONE CAPSULE BY MOUTH 1 TO 3 TIMES DAILY 270 capsule 2  . HYDROcodone-acetaminophen (NORCO) 10-325 MG tablet Take 1 tablet by mouth 2 (two) times daily as needed for pain.    Marland Kitchen ipratropium (ATROVENT) 0.03 % nasal spray Place 2 sprays into both nostrils every 12 (twelve) hours. 30 mL 12  . ketoconazole (NIZORAL) 2 % shampoo DAILY FOR ONE WEEK, THEN 2-3 TIMES A WEEK FOR MAINTENANCE. 240 mL 2  . lisinopril (PRINIVIL,ZESTRIL) 10 MG tablet Take 1 tablet (10 mg total) by mouth daily. 90 tablet 1  . Loratadine (CLARITIN PO) Take by mouth.    . metoprolol succinate (TOPROL-XL) 100 MG 24 hr tablet TAKE 1 TABLET BY MOUTH EVERY DAY 90 tablet 0  . Misc. Devices MISC [employer] Shoe    . multivitamin [facility]) per tablet Take 1 tablet by mouth daily.     . Potassium 99 MG TABS Take 2 tablets by mouth daily.      . tamsulosin (FLOMAX) 0.4 MG CAPS capsule TAKE 1 CAPSULE (0.4 MG TOTAL) BY MOUTH DAILY. MUST SCHEDULE APPT FOR REFILLS 90 capsule 1  . Betamethasone Valerate 0.12 % foam APPLY 1 APPLICATION TOPICALLY 2 (TWO) TIMES DAILY. 100 g 0  . cyclopentolate (CYCLODRYL,CYCLOGYL) 1 % ophthalmic solution Place 2 drops  into the right eye once.      No facility-administered medications prior to visit.     Allergies  Allergen Reactions  . Nsaids Other (See Comments)    Per gastroenterologist     ROS Review of Systems    Objective:    Physical Exam  Constitutional: He is oriented to person, place, and time. He appears well-developed and well-nourished.  HENT:  Head: Normocephalic and atraumatic.  Cardiovascular: Normal rate, regular rhythm and normal heart sounds.  Pulmonary/Chest: Effort normal and breath sounds normal.  Abdominal: Soft. Bowel sounds are normal. He exhibits no distension and no mass. There is no abdominal tenderness. There is no rebound and no guarding.  Neurological: He is alert and oriented to person, place, and time.  Skin: Skin is warm and dry.  Psychiatric: He has a normal mood and affect. His behavior is normal.    BP 133/77   Pulse 64   Temp 98.4 F (36.9 C) (Oral)   Wt 203 lb (92.1 kg)   BMI 26.78 kg/m  Wt Readings from Last 3 Encounters:  05/28/19 203 lb (92.1 kg)  11/26/18 210 lb (95.3 kg)  05/30/18 201 lb (91.2 kg)     Health Maintenance Due  Topic Date Due  . HIV Screening  01/05/1972  . INFLUENZA VACCINE  05/03/2019    There are no preventive care reminders to display for this patient.  Lab Results  Component Value Date   TSH 1.859 03/06/2013   Lab Results  Component Value Date   WBC 7.7 11/27/2018   HGB 14.5 11/27/2018   HCT 41.8 11/27/2018   MCV 97.7 11/27/2018   PLT 231 11/27/2018   Lab Results  Component Value Date   NA 142 11/27/2018   K 4.3 11/27/2018   CO2 28 11/27/2018   GLUCOSE 103 (H) 11/27/2018   BUN 24 11/27/2018   CREATININE 1.20 11/27/2018   BILITOT 0.5 11/27/2018   ALKPHOS 55 10/06/2016   AST 26 11/27/2018   ALT 27 11/27/2018   PROT 6.0 (L) 11/27/2018   ALBUMIN 3.9 10/06/2016   CALCIUM 9.3 11/27/2018   Lab Results  Component Value Date   CHOL 145 11/27/2018   Lab Results  Component Value Date   HDL 39 (L) 11/27/2018   Lab Results   Component Value Date   LDLCALC 87 11/27/2018   Lab Results  Component Value Date   TRIG 96 11/27/2018   Lab Results  Component Value Date   CHOLHDL 3.7 11/27/2018   Lab Results  Component Value Date   HGBA1C 5.7 (A) 05/28/2019      Assessment & Plan:   Problem List Items Addressed This Visit      Cardiovascular and Mediastinum   Essential hypertension, benign    Well controlled. Continue current regimen. Follow up in 6 mo.       Relevant Orders   BASIC METABOLIC PANEL WITH GFR   Troponin I   TSH     Endocrine   IFG (impaired fasting glucose)    A1C up from previous but still on the low end. Make sure working on healthy diet and exercise.          Genitourinary   BPH (benign prostatic hyperplasia)    Continue current regimen.          Other   Hyperlipidemia    Continue daily statin.       Hiatal hernia    ON PPI, helped initially but symptoms have bc more persistant.  Will refer to GI for further eval. May benefit from endoscopy, etc.        Relevant Orders   Ambulatory referral to Gastroenterology    Other Visit Diagnoses    Abnormal glucose    -  Primary   Relevant Orders   POCT glycosylated hemoglobin (Hb A1C) (Completed)   Need for immunization against influenza       Relevant Orders   Flu Vaccine QUAD 36+ mos IM (Completed)   Atypical chest pain       Relevant Orders   BASIC METABOLIC PANEL WITH GFR   Troponin I   TSH   EKG 12-Lead     EKG shows rate of 63 bpm, normal sinus rhythm with no acute ST-T wave changes.  No orders of the defined types were placed in this encounter.   Follow-up: Return in about 6 months (around 11/28/2019).    Beatrice Lecher, MD

## 2019-05-29 LAB — BASIC METABOLIC PANEL WITH GFR
BUN: 21 mg/dL (ref 7–25)
CO2: 27 mmol/L (ref 20–32)
Calcium: 9 mg/dL (ref 8.6–10.3)
Chloride: 104 mmol/L (ref 98–110)
Creat: 1.18 mg/dL (ref 0.70–1.25)
GFR, Est African American: 76 mL/min/{1.73_m2} (ref >=60)
GFR, Est Non African American: 66 mL/min/{1.73_m2} (ref >=60)
Glucose, Bld: 91 mg/dL (ref 65–99)
Potassium: 4.8 mmol/L (ref 3.5–5.3)
Sodium: 140 mmol/L (ref 135–146)

## 2019-05-29 LAB — TROPONIN I: Troponin I: <0.01 ng/mL (ref <=0.0)

## 2019-05-29 LAB — TSH: TSH: 1.52 mIU/L (ref 0.40–4.50)

## 2019-06-10 ENCOUNTER — Other Ambulatory Visit: Payer: Self-pay | Admitting: Family Medicine

## 2019-06-19 DIAGNOSIS — M79671 Pain in right foot: Secondary | ICD-10-CM | POA: Diagnosis not present

## 2019-06-19 DIAGNOSIS — M5412 Radiculopathy, cervical region: Secondary | ICD-10-CM | POA: Diagnosis not present

## 2019-06-19 DIAGNOSIS — M961 Postlaminectomy syndrome, not elsewhere classified: Secondary | ICD-10-CM | POA: Diagnosis not present

## 2019-06-19 DIAGNOSIS — G894 Chronic pain syndrome: Secondary | ICD-10-CM | POA: Diagnosis not present

## 2019-06-24 DIAGNOSIS — C44311 Basal cell carcinoma of skin of nose: Secondary | ICD-10-CM | POA: Diagnosis not present

## 2019-06-25 ENCOUNTER — Other Ambulatory Visit: Payer: Self-pay | Admitting: Family Medicine

## 2019-07-29 ENCOUNTER — Encounter: Payer: Self-pay | Admitting: Family Medicine

## 2019-08-14 DIAGNOSIS — G894 Chronic pain syndrome: Secondary | ICD-10-CM | POA: Diagnosis not present

## 2019-08-14 DIAGNOSIS — M79671 Pain in right foot: Secondary | ICD-10-CM | POA: Diagnosis not present

## 2019-08-14 DIAGNOSIS — M961 Postlaminectomy syndrome, not elsewhere classified: Secondary | ICD-10-CM | POA: Diagnosis not present

## 2019-08-14 DIAGNOSIS — M5412 Radiculopathy, cervical region: Secondary | ICD-10-CM | POA: Diagnosis not present

## 2019-10-09 ENCOUNTER — Other Ambulatory Visit: Payer: Self-pay | Admitting: Family Medicine

## 2019-10-10 ENCOUNTER — Other Ambulatory Visit: Payer: Self-pay | Admitting: Sports Medicine

## 2019-10-10 DIAGNOSIS — M5416 Radiculopathy, lumbar region: Secondary | ICD-10-CM

## 2019-10-19 ENCOUNTER — Other Ambulatory Visit: Payer: Self-pay | Admitting: Sports Medicine

## 2019-10-19 DIAGNOSIS — M5416 Radiculopathy, lumbar region: Secondary | ICD-10-CM

## 2019-11-07 DIAGNOSIS — Z5181 Encounter for therapeutic drug level monitoring: Secondary | ICD-10-CM | POA: Diagnosis not present

## 2019-11-07 DIAGNOSIS — Z79899 Other long term (current) drug therapy: Secondary | ICD-10-CM | POA: Diagnosis not present

## 2019-11-08 ENCOUNTER — Other Ambulatory Visit: Payer: Self-pay | Admitting: Sports Medicine

## 2019-11-08 DIAGNOSIS — M5416 Radiculopathy, lumbar region: Secondary | ICD-10-CM

## 2019-11-11 DIAGNOSIS — L820 Inflamed seborrheic keratosis: Secondary | ICD-10-CM | POA: Diagnosis not present

## 2019-11-11 DIAGNOSIS — C4401 Basal cell carcinoma of skin of lip: Secondary | ICD-10-CM | POA: Diagnosis not present

## 2019-11-11 DIAGNOSIS — Z85828 Personal history of other malignant neoplasm of skin: Secondary | ICD-10-CM | POA: Diagnosis not present

## 2019-11-11 DIAGNOSIS — D485 Neoplasm of uncertain behavior of skin: Secondary | ICD-10-CM | POA: Diagnosis not present

## 2019-11-14 DIAGNOSIS — M961 Postlaminectomy syndrome, not elsewhere classified: Secondary | ICD-10-CM | POA: Diagnosis not present

## 2019-11-14 DIAGNOSIS — G894 Chronic pain syndrome: Secondary | ICD-10-CM | POA: Diagnosis not present

## 2019-11-14 DIAGNOSIS — M5412 Radiculopathy, cervical region: Secondary | ICD-10-CM | POA: Diagnosis not present

## 2019-11-14 DIAGNOSIS — M79671 Pain in right foot: Secondary | ICD-10-CM | POA: Diagnosis not present

## 2019-12-01 ENCOUNTER — Other Ambulatory Visit: Payer: Self-pay

## 2019-12-01 ENCOUNTER — Encounter: Payer: Self-pay | Admitting: Family Medicine

## 2019-12-01 ENCOUNTER — Ambulatory Visit (INDEPENDENT_AMBULATORY_CARE_PROVIDER_SITE_OTHER): Payer: BC Managed Care – PPO | Admitting: Family Medicine

## 2019-12-01 VITALS — BP 131/78 | HR 78 | Ht 73.0 in | Wt 198.0 lb

## 2019-12-01 DIAGNOSIS — N400 Enlarged prostate without lower urinary tract symptoms: Secondary | ICD-10-CM

## 2019-12-01 DIAGNOSIS — K760 Fatty (change of) liver, not elsewhere classified: Secondary | ICD-10-CM

## 2019-12-01 DIAGNOSIS — R7301 Impaired fasting glucose: Secondary | ICD-10-CM | POA: Diagnosis not present

## 2019-12-01 DIAGNOSIS — I10 Essential (primary) hypertension: Secondary | ICD-10-CM | POA: Diagnosis not present

## 2019-12-01 DIAGNOSIS — M25562 Pain in left knee: Secondary | ICD-10-CM

## 2019-12-01 DIAGNOSIS — M5416 Radiculopathy, lumbar region: Secondary | ICD-10-CM

## 2019-12-01 DIAGNOSIS — M25472 Effusion, left ankle: Secondary | ICD-10-CM

## 2019-12-01 LAB — POCT GLYCOSYLATED HEMOGLOBIN (HGB A1C): Hemoglobin A1C: 5.4 % (ref 4.0–5.6)

## 2019-12-01 MED ORDER — LISINOPRIL 10 MG PO TABS: 10.0000 mg | ORAL_TABLET | Freq: Every day | ORAL | 1 refills | Status: DC

## 2019-12-01 MED ORDER — TAMSULOSIN HCL 0.4 MG PO CAPS: 0.4000 mg | ORAL_CAPSULE | Freq: Every day | ORAL | 1 refills | Status: DC

## 2019-12-01 MED ORDER — GABAPENTIN 300 MG PO CAPS: 300.0000 mg | ORAL_CAPSULE | Freq: Three times a day (TID) | ORAL | 3 refills | Status: DC

## 2019-12-01 NOTE — Assessment & Plan Note (Addendum)
Much improved from previous. Keep up the good work. Well controlled. Continue current regimen. Follow up in  6 mo

## 2019-12-01 NOTE — Assessment & Plan Note (Signed)
Well controlled. Continue current regimen. Follow up in  6 mo  

## 2019-12-01 NOTE — Assessment & Plan Note (Addendum)
Continue current regimen.    Follow-up in 6 months.

## 2019-12-01 NOTE — Progress Notes (Signed)
Established Patient Office Visit  Subjective:  Patient ID: Raymond Mitchell, male    DOB: [birth year]  Age: 64 y.o. MRN: [id number]  CC:  Chief Complaint  Patient presents with  . Hypertension  . ifg    HPI ARISTIDE SHEERIN presents for  Hypertension- Pt denies chest pain, SOB, dizziness, or heart palpitations.  Taking meds as directed w/o problems.  Denies medication side effects.   Impaired fasting glucose-no increased thirst or urination. No symptoms consistent with hypoglycemia.  F/U BPH -currently on Flomax 0.4 mg tolerating well without any side effects or problems.  He does need refills on several medications.  Fatty liver-we have been following his liver enzymes for some time he did recently cut back on some sweets.  He is also been having some left knee pain for about 5 to 6 weeks..  He had problems with his knee back in 1997 when he actually had a sports injury.  At the time he mostly was just able to do rehab but did not require any surgical intervention but was followed for with an orthopedist for quite a while.  He says he is having more pain usually at the end of the day and its more painful with flexion.  He thinks he might of slightly tweaked his knee a couple of weeks ago.  He thinks little bit unusual as he also had a little bit of left ankle swelling but he feels like that actually has gotten a little bit better.  He has been doing some light weights in the gym and feels like that is been helping as well he is also been taking his wife's gabapentin because he ran out of his own.  He is just been taking 1 tab at night.  Past Medical History:  Diagnosis Date  . Ankylosing spondylitis ([place])   . BPH (benign prostatic hyperplasia)   . Diverticulosis   . Fatty liver   . Lymphocytic colitis 01/15/2018  . Pes planus   . Tobacco user   . Varicose veins     Past Surgical History:  Procedure Laterality Date  . c spine ESI     [name]  . INGUINAL HERNIA REPAIR   1994  . RTC surgery  2005    Family History  Problem Relation Age of Onset  . Hyperlipidemia Mother   . Hypertension Mother   . Diabetes Mother   . Hyperlipidemia Father   . Hypertension Father   . Other Father        ank spondy  . Other Brother        Brain tumor, CVA  . Other Brother        ank spondy    Social History   Socioeconomic History  . Marital status: Married    Spouse name: Not on file  . Number of children: Not on file  . Years of education: Not on file  . Highest education level: Not on file  Occupational History  . Not on file  Tobacco Use  . Smoking status: Current Some Day Smoker    Packs/day: 1.00    Years: 25.00    Pack years: 25.00    Last attempt to quit: 12/28/2011    Years since quitting: 7.9  . Smokeless tobacco: Never Used  Substance and Sexual Activity  . Alcohol use: Yes    Alcohol/week: 21.0 standard drinks    Types: 21 Standard drinks or equivalent per week    Comment: 3 drinks per  day  . Drug use: No  . Sexual activity: Not on file  Other Topics Concern  . Not on file  Social History Narrative  . Not on file   Social Determinants of Health   Financial Resource Strain:   . Difficulty of Paying Living Expenses: Not on file  Food Insecurity:   . Worried About Charity fundraiser in the Last Year: Not on file  . Ran Out of Food in the Last Year: Not on file  Transportation Needs:   . Lack of Transportation (Medical): Not on file  . Lack of Transportation (Non-Medical): Not on file  Physical Activity:   . Days of Exercise per Week: Not on file  . Minutes of Exercise per Session: Not on file  Stress:   . Feeling of Stress : Not on file  Social Connections:   . Frequency of Communication with Friends and Family: Not on file  . Frequency of Social Gatherings with Friends and Family: Not on file  . Attends Religious Services: Not on file  . Active Member of Clubs or Organizations: Not on file  . Attends Archivist  Meetings: Not on file  . Marital Status: Not on file  Intimate Partner Violence:   . Fear of Current or Ex-Partner: Not on file  . Emotionally Abused: Not on file  . Physically Abused: Not on file  . Sexually Abused: Not on file    Outpatient Medications Prior to Visit  Medication Sig Dispense Refill  . aspirin 81 MG tablet Take 81 mg by mouth daily.     Marland Kitchen atorvastatin (LIPITOR) 40 MG tablet Take 1 tablet (40 mg total) by mouth daily. 90 tablet 3  . Calcium Carbonate-Vitamin D (CALTRATE 600+D) 600-400 MG-UNIT per tablet Take 1 tablet by mouth daily.      . celecoxib (CELEBREX) 200 MG capsule TAKE 1-2 CAPSULES (200-400 MG TOTAL) BY MOUTH DAILY. 180 capsule 3  . Esomeprazole Magnesium (NEXIUM PO) Take by mouth.    . fish oil-omega-3 fatty acids 1000 MG capsule Take by mouth. 2 capsules po bid      . ipratropium (ATROVENT) 0.03 % nasal spray Place 2 sprays into both nostrils every 12 (twelve) hours. 30 mL 12  . ketoconazole (NIZORAL) 2 % shampoo DAILY FOR ONE WEEK, THEN 2-3 TIMES A WEEK FOR MAINTENANCE. 240 mL 2  . Loratadine (CLARITIN PO) Take by mouth.    . metoprolol succinate (TOPROL-XL) 100 MG 24 hr tablet TAKE 1 TABLET BY MOUTH EVERY DAY 90 tablet 0  . Misc. Devices MISC [employer] Shoe    . multivitamin [facility]) per tablet Take 1 tablet by mouth daily.     . Potassium 99 MG TABS Take 2 tablets by mouth daily.      Marland Kitchen gabapentin (NEURONTIN) 300 MG capsule TAKE ONE CAPSULE BY MOUTH 1 TO 3 TIMES DAILY 270 capsule 2  . lisinopril (ZESTRIL) 10 MG tablet TAKE 1 TABLET BY MOUTH EVERY DAY 90 tablet 1  . tamsulosin (FLOMAX) 0.4 MG CAPS capsule TAKE 1 CAPSULE (0.4 MG TOTAL) BY MOUTH DAILY. MUST SCHEDULE APPT FOR REFILLS 90 capsule 1   No facility-administered medications prior to visit.    Allergies  Allergen Reactions  . Nsaids Other (See Comments)    Per gastroenterologist     ROS Review of Systems    Objective:    Physical Exam  Constitutional: He is oriented to person,  place, and time. He appears well-developed and well-nourished.  HENT:  Head: Normocephalic and atraumatic.  Cardiovascular: Normal rate, regular rhythm and normal heart sounds.  Pulmonary/Chest: Effort normal and breath sounds normal.  Musculoskeletal:     Comments: Left knee is nontender on exam.  Normal flexion and extension.  He does have significant crepitus with flexion and extension.  Strength is 5 out of 5 at the ankle and knee.  Trace edema around the ankle.  Negative anterior drawer.  No increased pain or laxity with valgus or varus stress.  Neurological: He is alert and oriented to person, place, and time.  Skin: Skin is warm and dry.  Psychiatric: He has a normal mood and affect. His behavior is normal.    BP 131/78   Pulse 78   Ht 6\' 1"  (1.854 m)   Wt 198 lb (89.8 kg)   SpO2 97%   BMI 26.12 kg/m  Wt Readings from Last 3 Encounters:  12/01/19 198 lb (89.8 kg)  05/28/19 203 lb (92.1 kg)  11/26/18 210 lb (95.3 kg)     Health Maintenance Due  Topic Date Due  . HIV Screening  01/05/1972    There are no preventive care reminders to display for this patient.  Lab Results  Component Value Date   TSH 1.52 05/28/2019   Lab Results  Component Value Date   WBC 7.7 11/27/2018   HGB 14.5 11/27/2018   HCT 41.8 11/27/2018   MCV 97.7 11/27/2018   PLT 231 11/27/2018   Lab Results  Component Value Date   NA 140 05/28/2019   K 4.8 05/28/2019   CO2 27 05/28/2019   GLUCOSE 91 05/28/2019   BUN 21 05/28/2019   CREATININE 1.18 05/28/2019   BILITOT 0.5 11/27/2018   ALKPHOS 55 10/06/2016   AST 26 11/27/2018   ALT 27 11/27/2018   PROT 6.0 (L) 11/27/2018   ALBUMIN 3.9 10/06/2016   CALCIUM 9.0 05/28/2019   Lab Results  Component Value Date   CHOL 145 11/27/2018   Lab Results  Component Value Date   HDL 39 (L) 11/27/2018   Lab Results  Component Value Date   LDLCALC 87 11/27/2018   Lab Results  Component Value Date   TRIG 96 11/27/2018   Lab Results   Component Value Date   CHOLHDL 3.7 11/27/2018   Lab Results  Component Value Date   HGBA1C 5.4 12/01/2019      Assessment & Plan:   Problem List Items Addressed This Visit      Cardiovascular and Mediastinum   Essential hypertension, benign - Primary    Well controlled. Continue current regimen. Follow up in  6 mo      Relevant Medications   lisinopril (ZESTRIL) 10 MG tablet   Other Relevant Orders   COMPLETE METABOLIC PANEL WITH GFR   Lipid panel     Digestive   Fatty liver   Relevant Orders   COMPLETE METABOLIC PANEL WITH GFR   Lipid panel     Endocrine   IFG (impaired fasting glucose)    Much improved from previous. Keep up the good work. Well controlled. Continue current regimen. Follow up in  6 mo      Relevant Orders   POCT glycosylated hemoglobin (Hb A1C) (Completed)     Nervous and Auditory   Right lumbar radiculopathy   Relevant Medications   gabapentin (NEURONTIN) 300 MG capsule     Genitourinary   BPH (benign prostatic hyperplasia)    Continue current regimen.  Follow-up in 6 months.  Relevant Medications   tamsulosin (FLOMAX) 0.4 MG CAPS capsule   Other Relevant Orders   PSA    Other Visit Diagnoses    Acute pain of left knee       Left ankle swelling         Left knee pain-he seems to be doing his own home rehab and feels like it is gradually getting a little bit better so just encouraged him to continue with current regimen.  He is also using Advil as needed for pain relief.  If not continuing to improve over the next couple of weeks then would like to get him in with Dr. Dianah Field or sports med..  I did go ahead and refill his gabapentin in the meantime.  Ankle swelling-just some trace edema today.  Again he feels like it is getting better if like it is probably directly related to the inflammation in the knee.  I do not see any other worrisome findings on exam such as evidence of DVT etc.  Meds ordered this encounter  Medications   . gabapentin (NEURONTIN) 300 MG capsule    Sig: Take 1 capsule (300 mg total) by mouth 3 (three) times daily. TAKE ONE CAPSULE BY MOUTH 1 TO 3 TIMES DAILY    Dispense:  270 capsule    Refill:  3  . tamsulosin (FLOMAX) 0.4 MG CAPS capsule    Sig: Take 1 capsule (0.4 mg total) by mouth daily.    Dispense:  90 capsule    Refill:  1  . lisinopril (ZESTRIL) 10 MG tablet    Sig: Take 1 tablet (10 mg total) by mouth daily.    Dispense:  90 tablet    Refill:  1    Follow-up: Return in about 6 months (around 06/02/2020) for Hypertension and pre-diabetes .    Beatrice Lecher, MD

## 2019-12-04 DIAGNOSIS — I10 Essential (primary) hypertension: Secondary | ICD-10-CM | POA: Diagnosis not present

## 2019-12-04 DIAGNOSIS — K760 Fatty (change of) liver, not elsewhere classified: Secondary | ICD-10-CM | POA: Diagnosis not present

## 2019-12-04 DIAGNOSIS — N400 Enlarged prostate without lower urinary tract symptoms: Secondary | ICD-10-CM | POA: Diagnosis not present

## 2019-12-04 LAB — COMPLETE METABOLIC PANEL WITH GFR
AG Ratio: 2 (calc) (ref 1.0–2.5)
ALT: 21 U/L (ref 9–46)
AST: 19 U/L (ref 10–35)
Albumin: 4 g/dL (ref 3.6–5.1)
Alkaline phosphatase (APISO): 69 U/L (ref 35–144)
BUN: 24 mg/dL (ref 7–25)
CO2: 29 mmol/L (ref 20–32)
Calcium: 9 mg/dL (ref 8.6–10.3)
Chloride: 107 mmol/L (ref 98–110)
Creat: 1.12 mg/dL (ref 0.70–1.25)
GFR, Est African American: 81 mL/min/{1.73_m2} (ref >=60)
GFR, Est Non African American: 70 mL/min/{1.73_m2} (ref >=60)
Globulin: 2 g/dL (calc) (ref 1.9–3.7)
Glucose, Bld: 108 mg/dL — ABNORMAL HIGH (ref 65–99)
Potassium: 4.5 mmol/L (ref 3.5–5.3)
Sodium: 143 mmol/L (ref 135–146)
Total Bilirubin: 0.4 mg/dL (ref 0.2–1.2)
Total Protein: 6 g/dL — ABNORMAL LOW (ref 6.1–8.1)

## 2019-12-04 LAB — LIPID PANEL
Cholesterol: 131 mg/dL (ref <200)
HDL: 35 mg/dL — ABNORMAL LOW (ref >=40)
LDL Cholesterol (Calc): 80 mg/dL (calc)
Non-HDL Cholesterol (Calc): 96 mg/dL (calc) (ref <130)
Total CHOL/HDL Ratio: 3.7 (calc) (ref <5.0)
Triglycerides: 81 mg/dL (ref <150)

## 2019-12-04 LAB — PSA: PSA: 0.8 ng/mL (ref <=4.0)

## 2019-12-09 DIAGNOSIS — L820 Inflamed seborrheic keratosis: Secondary | ICD-10-CM | POA: Diagnosis not present

## 2019-12-09 DIAGNOSIS — C4401 Basal cell carcinoma of skin of lip: Secondary | ICD-10-CM | POA: Diagnosis not present

## 2019-12-16 ENCOUNTER — Other Ambulatory Visit: Payer: Self-pay | Admitting: Sports Medicine

## 2019-12-16 ENCOUNTER — Other Ambulatory Visit: Payer: Self-pay | Admitting: Family Medicine

## 2019-12-16 DIAGNOSIS — M5412 Radiculopathy, cervical region: Secondary | ICD-10-CM

## 2019-12-19 DIAGNOSIS — Z23 Encounter for immunization: Secondary | ICD-10-CM | POA: Diagnosis not present

## 2020-01-09 DIAGNOSIS — Z23 Encounter for immunization: Secondary | ICD-10-CM | POA: Diagnosis not present

## 2020-01-13 DIAGNOSIS — M79671 Pain in right foot: Secondary | ICD-10-CM | POA: Diagnosis not present

## 2020-01-13 DIAGNOSIS — M5412 Radiculopathy, cervical region: Secondary | ICD-10-CM | POA: Diagnosis not present

## 2020-01-13 DIAGNOSIS — G894 Chronic pain syndrome: Secondary | ICD-10-CM | POA: Diagnosis not present

## 2020-01-13 DIAGNOSIS — M961 Postlaminectomy syndrome, not elsewhere classified: Secondary | ICD-10-CM | POA: Diagnosis not present

## 2020-02-08 ENCOUNTER — Other Ambulatory Visit: Payer: Self-pay | Admitting: Sports Medicine

## 2020-02-08 DIAGNOSIS — M5412 Radiculopathy, cervical region: Secondary | ICD-10-CM

## 2020-02-21 ENCOUNTER — Other Ambulatory Visit: Payer: Self-pay | Admitting: Sports Medicine

## 2020-02-21 DIAGNOSIS — M5412 Radiculopathy, cervical region: Secondary | ICD-10-CM

## 2020-03-10 DIAGNOSIS — M79671 Pain in right foot: Secondary | ICD-10-CM | POA: Diagnosis not present

## 2020-03-10 DIAGNOSIS — G894 Chronic pain syndrome: Secondary | ICD-10-CM | POA: Diagnosis not present

## 2020-03-10 DIAGNOSIS — M5412 Radiculopathy, cervical region: Secondary | ICD-10-CM | POA: Diagnosis not present

## 2020-03-10 DIAGNOSIS — M961 Postlaminectomy syndrome, not elsewhere classified: Secondary | ICD-10-CM | POA: Diagnosis not present

## 2020-03-14 ENCOUNTER — Other Ambulatory Visit: Payer: Self-pay | Admitting: Sports Medicine

## 2020-03-14 DIAGNOSIS — M5412 Radiculopathy, cervical region: Secondary | ICD-10-CM

## 2020-03-16 DIAGNOSIS — M25562 Pain in left knee: Secondary | ICD-10-CM | POA: Diagnosis not present

## 2020-03-16 DIAGNOSIS — M170 Bilateral primary osteoarthritis of knee: Secondary | ICD-10-CM | POA: Diagnosis not present

## 2020-03-16 DIAGNOSIS — M25561 Pain in right knee: Secondary | ICD-10-CM | POA: Diagnosis not present

## 2020-03-19 ENCOUNTER — Other Ambulatory Visit: Payer: Self-pay

## 2020-03-19 DIAGNOSIS — M5412 Radiculopathy, cervical region: Secondary | ICD-10-CM

## 2020-03-19 NOTE — Telephone Encounter (Signed)
Advised patient we would be unable to refill Celebrex due to initial Rx being written 1 year prior without an appointment.  Patient expressed understanding. Declined transfer to Scheduling. States he will callback to have appointment for the 1st of the week.

## 2020-03-25 DIAGNOSIS — M25562 Pain in left knee: Secondary | ICD-10-CM | POA: Diagnosis not present

## 2020-03-25 DIAGNOSIS — M1712 Unilateral primary osteoarthritis, left knee: Secondary | ICD-10-CM | POA: Diagnosis not present

## 2020-04-02 ENCOUNTER — Encounter: Payer: Self-pay | Admitting: Family Medicine

## 2020-04-02 NOTE — Progress Notes (Signed)
Colon entered 

## 2020-04-05 ENCOUNTER — Other Ambulatory Visit: Payer: Self-pay | Admitting: Family Medicine

## 2020-04-06 ENCOUNTER — Other Ambulatory Visit: Payer: Self-pay | Admitting: Family Medicine

## 2020-04-21 ENCOUNTER — Other Ambulatory Visit: Payer: Self-pay | Admitting: Sports Medicine

## 2020-04-21 DIAGNOSIS — M5412 Radiculopathy, cervical region: Secondary | ICD-10-CM

## 2020-04-21 NOTE — Telephone Encounter (Signed)
I have not seen this patient in 3 years, PCP may feel comfortable refilling this.

## 2020-05-04 ENCOUNTER — Encounter: Payer: Self-pay | Admitting: Family Medicine

## 2020-05-04 ENCOUNTER — Other Ambulatory Visit: Payer: Self-pay

## 2020-05-04 ENCOUNTER — Telehealth: Payer: Self-pay | Admitting: Family Medicine

## 2020-05-04 ENCOUNTER — Ambulatory Visit: Payer: BC Managed Care – PPO | Admitting: Family Medicine

## 2020-05-04 VITALS — BP 126/70 | HR 58 | Ht 73.0 in | Wt 192.0 lb

## 2020-05-04 DIAGNOSIS — K5909 Other constipation: Secondary | ICD-10-CM | POA: Diagnosis not present

## 2020-05-04 DIAGNOSIS — K4090 Unilateral inguinal hernia, without obstruction or gangrene, not specified as recurrent: Secondary | ICD-10-CM

## 2020-05-04 MED ORDER — SENNOSIDES-DOCUSATE SODIUM 8.6-50 MG PO TABS: 2.0000 | ORAL_TABLET | Freq: Every day | ORAL | 6 refills | Status: DC

## 2020-05-04 NOTE — Progress Notes (Signed)
He reports that he had seen a surgeon about 2 years ago for the hernia but due to his insurance he didn't get the surgery done.  He stated that for the past several months the hernia has gotten larger and he has to lay down due to the pain.

## 2020-05-04 NOTE — Telephone Encounter (Signed)
Call [facility] and see when they wanted him to repeat his colonoscopy.

## 2020-05-04 NOTE — Assessment & Plan Note (Signed)
New prescription sent to pharmacy for Raymond Mitchell.  Just encouraged him to continue to work on hydration staying active which he does, and getting enough fiber in the diet.

## 2020-05-04 NOTE — Progress Notes (Signed)
Established Patient Office Visit  Subjective:  Patient ID: Raymond Mitchell, male    DOB: September 20, 1956  Age: 64 y.o. MRN: [id number]  CC:  Chief Complaint  Patient presents with  . Hernia    HPI Raymond Mitchell presents for right inguinal hernia.  He had made [place] aware of this a couple years ago.  In fact we even referred him to [facility] surgery for consultation.  At that time he really did not have good insurance and it was going to be quite costly for him to have the surgery.  But he says now he is actually been having more persistent and frequent pain.  Most the time is more of a dull ache around 2-4.  But occasionally the pain is more severe that he would rate in 9-10 and he says the only way he can get it to feel better as if he lays down.  He also wanted to discuss his bowels.  He had a colonoscopy about 5 years ago at the time he was diagnosed with lymphocytic colitis he wants to know when he needs to repeat the colonoscopy.  He also has been dealing with chronic constipation he usually takes about 3 stool softeners daily over-the-counter but says lately it really has not been working he says he drinks about 6-8 bottles of water a day.  He tried his wife's Senexon S 50/8.62 tabs at bedtime and says it actually worked really well to stimulate a bowel movement and would like to have a prescription.    Past Medical History:  Diagnosis Date  . Ankylosing spondylitis ([place])   . BPH (benign prostatic hyperplasia)   . Diverticulosis   . Fatty liver   . Lymphocytic colitis 01/15/2018  . Pes planus   . Tobacco user   . Varicose veins     Past Surgical History:  Procedure Laterality Date  . c spine ESI     [name]  . INGUINAL HERNIA REPAIR  1994  . RTC surgery  2005    Family History  Problem Relation Age of Onset  . Hyperlipidemia Mother   . Hypertension Mother   . Diabetes Mother   . Hyperlipidemia Father   . Hypertension Father   . Other Father        ank  spondy  . Other Brother        Brain tumor, CVA  . Other Brother        ank spondy    Social History   Socioeconomic History  . Marital status: Married    Spouse name: Not on file  . Number of children: Not on file  . Years of education: Not on file  . Highest education level: Not on file  Occupational History  . Not on file  Tobacco Use  . Smoking status: Current Some Day Smoker    Packs/day: 1.00    Years: 25.00    Pack years: 25.00    Last attempt to quit: 12/28/2011    Years since quitting: 8.3  . Smokeless tobacco: Never Used  Substance and Sexual Activity  . Alcohol use: Yes    Alcohol/week: 21.0 standard drinks    Types: 21 Standard drinks or equivalent per week    Comment: 3 drinks per day  . Drug use: No  . Sexual activity: Not on file  Other Topics Concern  . Not on file  Social History Narrative  . Not on file   Social Determinants of Health  Financial Resource Strain:   . Difficulty of Paying Living Expenses:   Food Insecurity:   . Worried About Charity fundraiser in the Last Year:   . Arboriculturist in the Last Year:   Transportation Needs:   . Film/video editor (Medical):   Marland Kitchen Lack of Transportation (Non-Medical):   Physical Activity:   . Days of Exercise per Week:   . Minutes of Exercise per Session:   Stress:   . Feeling of Stress :   Social Connections:   . Frequency of Communication with Friends and Family:   . Frequency of Social Gatherings with Friends and Family:   . Attends Religious Services:   . Active Member of Clubs or Organizations:   . Attends Archivist Meetings:   Marland Kitchen Marital Status:   Intimate Partner Violence:   . Fear of Current or Ex-Partner:   . Emotionally Abused:   Marland Kitchen Physically Abused:   . Sexually Abused:     Outpatient Medications Prior to Visit  Medication Sig Dispense Refill  . aspirin 81 MG tablet Take 81 mg by mouth daily.     Marland Kitchen atorvastatin (LIPITOR) 40 MG tablet TAKE 1 TABLET BY MOUTH EVERY  DAY 90 tablet 3  . Calcium Carbonate-Vitamin D (CALTRATE 600+D) 600-400 MG-UNIT per tablet Take 1 tablet by mouth daily.      . celecoxib (CELEBREX) 200 MG capsule TAKE 1-2 CAPSULES (200-400 MG TOTAL) BY MOUTH DAILY. 90 capsule 1  . Esomeprazole Magnesium (NEXIUM PO) Take by mouth.    . fish oil-omega-3 fatty acids 1000 MG capsule Take by mouth. 2 capsules po bid      . gabapentin (NEURONTIN) 300 MG capsule Take 1 capsule (300 mg total) by mouth 3 (three) times daily. TAKE ONE CAPSULE BY MOUTH 1 TO 3 TIMES DAILY 270 capsule 3  . HYDROcodone-acetaminophen (NORCO) 10-325 MG tablet Take by mouth.    Marland Kitchen ipratropium (ATROVENT) 0.03 % nasal spray PLACE 2 SPRAYS INTO BOTH NOSTRILS EVERY 12 (TWELVE) HOURS. 30 mL 4  . ketoconazole (NIZORAL) 2 % shampoo DAILY FOR ONE WEEK, THEN 2-3 TIMES A WEEK FOR MAINTENANCE. 240 mL 2  . lisinopril (ZESTRIL) 10 MG tablet Take 1 tablet (10 mg total) by mouth daily. 90 tablet 1  . Loratadine (CLARITIN PO) Take by mouth.    . metoprolol succinate (TOPROL-XL) 100 MG 24 hr tablet TAKE 1 TABLET BY MOUTH EVERY DAY 90 tablet 0  . multivitamin (THERAGRAN) per tablet Take 1 tablet by mouth daily.     . Potassium 99 MG TABS Take 2 tablets by mouth daily.      . tamsulosin (FLOMAX) 0.4 MG CAPS capsule TAKE 1 CAPSULE BY MOUTH EVERY DAY 90 capsule 1  . Misc. Devices MISC [place]     No facility-administered medications prior to visit.    Allergies  Allergen Reactions  . Nsaids Other (See Comments)    Per gastroenterologist     ROS Review of Systems    Objective:    Physical Exam Constitutional:      Appearance: He is well-developed.  HENT:     Head: Normocephalic and atraumatic.  Cardiovascular:     Rate and Rhythm: Normal rate and regular rhythm.     Heart sounds: Normal heart sounds.  Pulmonary:     Effort: Pulmonary effort is normal.     Breath sounds: Normal breath sounds.  Skin:    General: Skin is warm and dry.  Neurological:  Mental  Status: He is alert and oriented to person, place, and time.  Psychiatric:        Behavior: Behavior normal.     BP 126/70   Pulse (!) 58   Ht 6\' 1"  (1.854 m)   Wt 192 lb (87.1 kg)   SpO2 100%   BMI 25.33 kg/m  Wt Readings from Last 3 Encounters:  05/04/20 192 lb (87.1 kg)  12/01/19 198 lb (89.8 kg)  05/28/19 203 lb (92.1 kg)     Health Maintenance Due  Topic Date Due  . HIV Screening  Never done  . INFLUENZA VACCINE  05/02/2020    There are no preventive care reminders to display for this patient.  Lab Results  Component Value Date   TSH 1.52 05/28/2019   Lab Results  Component Value Date   WBC 7.7 11/27/2018   HGB 14.5 11/27/2018   HCT 41.8 11/27/2018   MCV 97.7 11/27/2018   PLT 231 11/27/2018   Lab Results  Component Value Date   NA 143 12/04/2019   K 4.5 12/04/2019   CO2 29 12/04/2019   GLUCOSE 108 (H) 12/04/2019   BUN 24 12/04/2019   CREATININE 1.12 12/04/2019   BILITOT 0.4 12/04/2019   ALKPHOS 55 10/06/2016   AST 19 12/04/2019   ALT 21 12/04/2019   PROT 6.0 (L) 12/04/2019   ALBUMIN 3.9 10/06/2016   CALCIUM 9.0 12/04/2019   Lab Results  Component Value Date   CHOL 131 12/04/2019   Lab Results  Component Value Date   HDL 35 (L) 12/04/2019   Lab Results  Component Value Date   LDLCALC 80 12/04/2019   Lab Results  Component Value Date   TRIG 81 12/04/2019   Lab Results  Component Value Date   CHOLHDL 3.7 12/04/2019   Lab Results  Component Value Date   HGBA1C 5.4 12/01/2019      Assessment & Plan:   Problem List Items Addressed This Visit      Other   CONSTIPATION, DRUG INDUCED - Primary    New prescription sent to pharmacy for [place].  Just encouraged him to continue to work on hydration staying active which he does, and getting enough fiber in the diet.      Relevant Medications   senna-docusate (SENEXON-S) 8.6-50 MG tablet    Other Visit Diagnoses    Right inguinal hernia       Relevant Orders   Ambulatory  referral to General Surgery      Right inguinal hernia.  Will refer back to [facility] surgery for consultation.  At this point he is having more significant discomfort and I think he needs to have this corrected this year and urged him to get this done promptly.  He also has better health insurance now so he is less worried about the financial burden of having the surgery.  Referral placed.    Meds ordered this encounter  Medications  . senna-docusate (SENEXON-S) 8.6-50 MG tablet    Sig: Take 2 tablets by mouth at bedtime.    Dispense:  60 tablet    Refill:  6    Follow-up: Return if symptoms worsen or fail to improve.    Beatrice Lecher, MD

## 2020-05-05 DIAGNOSIS — M25561 Pain in right knee: Secondary | ICD-10-CM | POA: Diagnosis not present

## 2020-05-05 DIAGNOSIS — G894 Chronic pain syndrome: Secondary | ICD-10-CM | POA: Diagnosis not present

## 2020-05-05 DIAGNOSIS — M961 Postlaminectomy syndrome, not elsewhere classified: Secondary | ICD-10-CM | POA: Diagnosis not present

## 2020-05-05 DIAGNOSIS — Z79899 Other long term (current) drug therapy: Secondary | ICD-10-CM | POA: Diagnosis not present

## 2020-05-05 DIAGNOSIS — Z5181 Encounter for therapeutic drug level monitoring: Secondary | ICD-10-CM | POA: Diagnosis not present

## 2020-05-05 DIAGNOSIS — M1712 Unilateral primary osteoarthritis, left knee: Secondary | ICD-10-CM | POA: Diagnosis not present

## 2020-05-11 NOTE — Telephone Encounter (Signed)
Spoke w/alicia and she informed me that pt is to have repeat colonoscopy done 11/30/2024.   The date in pt's chart was correct.

## 2020-05-18 DIAGNOSIS — Z85828 Personal history of other malignant neoplasm of skin: Secondary | ICD-10-CM | POA: Diagnosis not present

## 2020-05-18 DIAGNOSIS — L821 Other seborrheic keratosis: Secondary | ICD-10-CM | POA: Diagnosis not present

## 2020-05-27 DIAGNOSIS — K4090 Unilateral inguinal hernia, without obstruction or gangrene, not specified as recurrent: Secondary | ICD-10-CM | POA: Diagnosis not present

## 2020-06-02 ENCOUNTER — Other Ambulatory Visit: Payer: Self-pay

## 2020-06-02 ENCOUNTER — Encounter: Payer: Self-pay | Admitting: Family Medicine

## 2020-06-02 ENCOUNTER — Ambulatory Visit (INDEPENDENT_AMBULATORY_CARE_PROVIDER_SITE_OTHER): Payer: BC Managed Care – PPO | Admitting: Family Medicine

## 2020-06-02 VITALS — BP 130/77 | HR 71 | Ht 73.0 in | Wt 190.0 lb

## 2020-06-02 DIAGNOSIS — R7301 Impaired fasting glucose: Secondary | ICD-10-CM

## 2020-06-02 DIAGNOSIS — Z23 Encounter for immunization: Secondary | ICD-10-CM | POA: Diagnosis not present

## 2020-06-02 DIAGNOSIS — I10 Essential (primary) hypertension: Secondary | ICD-10-CM | POA: Diagnosis not present

## 2020-06-02 DIAGNOSIS — M7989 Other specified soft tissue disorders: Secondary | ICD-10-CM | POA: Diagnosis not present

## 2020-06-02 DIAGNOSIS — I878 Other specified disorders of veins: Secondary | ICD-10-CM | POA: Diagnosis not present

## 2020-06-02 LAB — POCT GLYCOSYLATED HEMOGLOBIN (HGB A1C): Hemoglobin A1C: 6.1 % — AB (ref 4.0–5.6)

## 2020-06-02 NOTE — Assessment & Plan Note (Signed)
A1c looks fantastic today.  Continue to work on healthy diet and regular exercise.  Follow-up in 6 months.  Lab Results  Component Value Date   HGBA1C 6.1 (A) 06/02/2020

## 2020-06-02 NOTE — Assessment & Plan Note (Signed)
Blood pressure overall looks good today systolic right at 657 that he says at home blood pressures are usually even better mostly in the 120s.  He is due for updated labs today.

## 2020-06-02 NOTE — Progress Notes (Signed)
Established Patient Office Visit  Subjective:  Patient ID: Raymond Mitchell, male    DOB: 1955/11/08  Age: 64 y.o. MRN: [id number]  CC:  Chief Complaint  Patient presents with  . Hypertension  . ifg  . Edema    L ankle    HPI Raymond Mitchell presents for   Hypertension- Pt denies chest pain, SOB, dizziness, or heart palpitations.  Taking meds as directed w/o problems.  Denies medication side effects.    Impaired fasting glucose-no increased thirst or urination. No symptoms consistent with hypoglycemia.  Left ankle swelling started after staring wearing brace.   No pain or swelling in the joint no erythema or increased heat.  No rash.  He just noticed it is a little bit more swollen.   Past Medical History:  Diagnosis Date  . Ankylosing spondylitis ([place])   . BPH (benign prostatic hyperplasia)   . Diverticulosis   . Fatty liver   . Lymphocytic colitis 01/15/2018  . Pes planus   . Tobacco user   . Varicose veins     Past Surgical History:  Procedure Laterality Date  . c spine ESI     [name]  . INGUINAL HERNIA REPAIR  1994  . RTC surgery  2005    Family History  Problem Relation Age of Onset  . Hyperlipidemia Mother   . Hypertension Mother   . Diabetes Mother   . Hyperlipidemia Father   . Hypertension Father   . Other Father        ank spondy  . Other Brother        Brain tumor, CVA  . Other Brother        ank spondy    Social History   Socioeconomic History  . Marital status: Married    Spouse name: Not on file  . Number of children: Not on file  . Years of education: Not on file  . Highest education level: Not on file  Occupational History  . Not on file  Tobacco Use  . Smoking status: Current Some Day Smoker    Packs/day: 1.00    Years: 25.00    Pack years: 25.00    Last attempt to quit: 12/28/2011    Years since quitting: 8.4  . Smokeless tobacco: Never Used  Substance and Sexual Activity  . Alcohol use: Yes    Alcohol/week: 21.0  standard drinks    Types: 21 Standard drinks or equivalent per week    Comment: 3 drinks per day  . Drug use: No  . Sexual activity: Not on file  Other Topics Concern  . Not on file  Social History Narrative  . Not on file   Social Determinants of Health   Financial Resource Strain:   . Difficulty of Paying Living Expenses: Not on file  Food Insecurity:   . Worried About Charity fundraiser in the Last Year: Not on file  . Ran Out of Food in the Last Year: Not on file  Transportation Needs:   . Lack of Transportation (Medical): Not on file  . Lack of Transportation (Non-Medical): Not on file  Physical Activity:   . Days of Exercise per Week: Not on file  . Minutes of Exercise per Session: Not on file  Stress:   . Feeling of Stress : Not on file  Social Connections:   . Frequency of Communication with Friends and Family: Not on file  . Frequency of Social Gatherings with Friends and Family:  Not on file  . Attends Religious Services: Not on file  . Active Member of Clubs or Organizations: Not on file  . Attends Archivist Meetings: Not on file  . Marital Status: Not on file  Intimate Partner Violence:   . Fear of Current or Ex-Partner: Not on file  . Emotionally Abused: Not on file  . Physically Abused: Not on file  . Sexually Abused: Not on file    Outpatient Medications Prior to Visit  Medication Sig Dispense Refill  . aspirin 81 MG tablet Take 81 mg by mouth daily.     Marland Kitchen atorvastatin (LIPITOR) 40 MG tablet TAKE 1 TABLET BY MOUTH EVERY DAY 90 tablet 3  . Calcium Carbonate-Vitamin D (CALTRATE 600+D) 600-400 MG-UNIT per tablet Take 1 tablet by mouth daily.      . celecoxib (CELEBREX) 200 MG capsule TAKE 1-2 CAPSULES (200-400 MG TOTAL) BY MOUTH DAILY. 90 capsule 1  . Esomeprazole Magnesium (NEXIUM PO) Take by mouth.    . fish oil-omega-3 fatty acids 1000 MG capsule Take by mouth. 2 capsules po bid      . gabapentin (NEURONTIN) 300 MG capsule Take 1 capsule (300  mg total) by mouth 3 (three) times daily. TAKE ONE CAPSULE BY MOUTH 1 TO 3 TIMES DAILY 270 capsule 3  . HYDROcodone-acetaminophen (NORCO) 10-325 MG tablet Take by mouth.    Marland Kitchen ipratropium (ATROVENT) 0.03 % nasal spray PLACE 2 SPRAYS INTO BOTH NOSTRILS EVERY 12 (TWELVE) HOURS. 30 mL 4  . ketoconazole (NIZORAL) 2 % shampoo DAILY FOR ONE WEEK, THEN 2-3 TIMES A WEEK FOR MAINTENANCE. 240 mL 2  . lisinopril (ZESTRIL) 10 MG tablet Take 1 tablet (10 mg total) by mouth daily. 90 tablet 1  . metoprolol succinate (TOPROL-XL) 100 MG 24 hr tablet TAKE 1 TABLET BY MOUTH EVERY DAY 90 tablet 0  . multivitamin (THERAGRAN) per tablet Take 1 tablet by mouth daily.     . Potassium 99 MG TABS Take 2 tablets by mouth daily.      Marland Kitchen senna-docusate (SENEXON-S) 8.6-50 MG tablet Take 2 tablets by mouth at bedtime. 60 tablet 6  . tamsulosin (FLOMAX) 0.4 MG CAPS capsule TAKE 1 CAPSULE BY MOUTH EVERY DAY 90 capsule 1  . Loratadine (CLARITIN PO) Take by mouth.     No facility-administered medications prior to visit.    Allergies  Allergen Reactions  . Nsaids Other (See Comments)    Per gastroenterologist     ROS Review of Systems    Objective:    Physical Exam Constitutional:      Appearance: He is well-developed.  HENT:     Head: Normocephalic and atraumatic.  Cardiovascular:     Rate and Rhythm: Normal rate and regular rhythm.     Heart sounds: Normal heart sounds.  Pulmonary:     Effort: Pulmonary effort is normal.     Breath sounds: Normal breath sounds.  Skin:    General: Skin is warm and dry.  Neurological:     Mental Status: He is alert and oriented to person, place, and time.  Psychiatric:        Behavior: Behavior normal.     BP 130/77   Pulse 71   Ht 6\' 1"  (1.854 m)   Wt 190 lb (86.2 kg)   SpO2 95%   BMI 25.07 kg/m  Wt Readings from Last 3 Encounters:  06/02/20 190 lb (86.2 kg)  05/04/20 192 lb (87.1 kg)  12/01/19 198 lb (89.8 kg)  Health Maintenance Due  Topic Date Due  .  HIV Screening  Never done    There are no preventive care reminders to display for this patient.  Lab Results  Component Value Date   TSH 1.52 05/28/2019   Lab Results  Component Value Date   WBC 7.7 11/27/2018   HGB 14.5 11/27/2018   HCT 41.8 11/27/2018   MCV 97.7 11/27/2018   PLT 231 11/27/2018   Lab Results  Component Value Date   NA 143 12/04/2019   K 4.5 12/04/2019   CO2 29 12/04/2019   GLUCOSE 108 (H) 12/04/2019   BUN 24 12/04/2019   CREATININE 1.12 12/04/2019   BILITOT 0.4 12/04/2019   ALKPHOS 55 10/06/2016   AST 19 12/04/2019   ALT 21 12/04/2019   PROT 6.0 (L) 12/04/2019   ALBUMIN 3.9 10/06/2016   CALCIUM 9.0 12/04/2019   Lab Results  Component Value Date   CHOL 131 12/04/2019   Lab Results  Component Value Date   HDL 35 (L) 12/04/2019   Lab Results  Component Value Date   LDLCALC 80 12/04/2019   Lab Results  Component Value Date   TRIG 81 12/04/2019   Lab Results  Component Value Date   CHOLHDL 3.7 12/04/2019   Lab Results  Component Value Date   HGBA1C 6.1 (A) 06/02/2020      Assessment & Plan:   Problem List Items Addressed This Visit      Cardiovascular and Mediastinum   Essential hypertension, benign - Primary    Blood pressure overall looks good today systolic right at 397 that he says at home blood pressures are usually even better mostly in the 120s.  He is due for updated labs today.      Relevant Orders   BASIC METABOLIC PANEL WITH GFR     Endocrine   IFG (impaired fasting glucose)    A1c looks fantastic today.  Continue to work on healthy diet and regular exercise.  Follow-up in 6 months.  Lab Results  Component Value Date   HGBA1C 6.1 (A) 06/02/2020         Relevant Orders   POCT glycosylated hemoglobin (Hb A1C) (Completed)     Other   Venous stasis    Other Visit Diagnoses    Left leg swelling       Need for immunization against influenza       Relevant Orders   Flu Vaccine QUAD 36+ mos IM (Completed)      Ankle swelling-he really has a little trace edema bilaterally discussed this is most likely from a condition called venous stasis and its probably is a little bit worse on the left because he has been wearing a very fitted knee brace.  We discussed trying to elevate the feet periodically throughout the day in addition to wearing compression stockings.  Call if it is getting worse or more bothersome.  No orders of the defined types were placed in this encounter.   Follow-up: Return in about 6 months (around 11/30/2020) for bp/ifg.    Beatrice Lecher, MD

## 2020-06-03 LAB — BASIC METABOLIC PANEL WITH GFR
BUN: 23 mg/dL (ref 7–25)
CO2: 30 mmol/L (ref 20–32)
Calcium: 9.2 mg/dL (ref 8.6–10.3)
Chloride: 105 mmol/L (ref 98–110)
Creat: 1.19 mg/dL (ref 0.70–1.25)
GFR, Est African American: 75 mL/min/{1.73_m2} (ref >=60)
GFR, Est Non African American: 65 mL/min/{1.73_m2} (ref >=60)
Glucose, Bld: 106 mg/dL (ref 65–139)
Potassium: 4.4 mmol/L (ref 3.5–5.3)
Sodium: 142 mmol/L (ref 135–146)

## 2020-06-03 NOTE — Progress Notes (Signed)
All labs are normal. 

## 2020-06-18 ENCOUNTER — Ambulatory Visit: Payer: Self-pay | Admitting: General Surgery

## 2020-06-30 DIAGNOSIS — M961 Postlaminectomy syndrome, not elsewhere classified: Secondary | ICD-10-CM | POA: Diagnosis not present

## 2020-06-30 DIAGNOSIS — G894 Chronic pain syndrome: Secondary | ICD-10-CM | POA: Diagnosis not present

## 2020-06-30 DIAGNOSIS — M25561 Pain in right knee: Secondary | ICD-10-CM | POA: Diagnosis not present

## 2020-06-30 DIAGNOSIS — M1712 Unilateral primary osteoarthritis, left knee: Secondary | ICD-10-CM | POA: Diagnosis not present

## 2020-07-13 ENCOUNTER — Other Ambulatory Visit: Payer: Self-pay | Admitting: Family Medicine

## 2020-07-13 ENCOUNTER — Encounter (HOSPITAL_BASED_OUTPATIENT_CLINIC_OR_DEPARTMENT_OTHER): Payer: Self-pay | Admitting: General Surgery

## 2020-07-13 ENCOUNTER — Other Ambulatory Visit: Payer: Self-pay

## 2020-07-13 ENCOUNTER — Other Ambulatory Visit (HOSPITAL_COMMUNITY)
Admission: RE | Admit: 2020-07-13 | Discharge: 2020-07-13 | Disposition: A | Discharge status: Home / Self Care | Payer: BC Managed Care – PPO | Source: Ambulatory Visit | Attending: General Surgery | Admitting: General Surgery

## 2020-07-13 DIAGNOSIS — Z20822 Contact with and (suspected) exposure to covid-19: Secondary | ICD-10-CM | POA: Diagnosis not present

## 2020-07-13 DIAGNOSIS — Z01812 Encounter for preprocedural laboratory examination: Secondary | ICD-10-CM | POA: Diagnosis not present

## 2020-07-13 LAB — SARS CORONAVIRUS 2 (TAT 6-24 HRS): SARS Coronavirus 2: NEGATIVE

## 2020-07-13 NOTE — Progress Notes (Signed)
Spoke w/ via phone for pre-op interview---pt Lab needs dos---- I stat 8, ekg             Lab results------none COVID test ------07-13-2020 1100 Arrive at -------815 am 07-16-2020 NPO after MN NO Solid Food.  Clear liquids from MN until---715 am then npo Medications to take morning of surgery -----gabapentin, atrovent nasal spray, nexium Diabetic medication -----n/a Patient Special Instructions -----none Pre-Op special Istructions -----none Patient verbalized understanding of instructions that were given at this phone interview. Patient denies shortness of breath, chest pain, fever, cough at this phone interview.

## 2020-07-16 ENCOUNTER — Ambulatory Visit (HOSPITAL_BASED_OUTPATIENT_CLINIC_OR_DEPARTMENT_OTHER): Payer: BC Managed Care – PPO | Admitting: Certified Registered"

## 2020-07-16 ENCOUNTER — Other Ambulatory Visit: Payer: Self-pay

## 2020-07-16 ENCOUNTER — Ambulatory Visit (HOSPITAL_BASED_OUTPATIENT_CLINIC_OR_DEPARTMENT_OTHER)
Admission: RE | Admit: 2020-07-16 | Discharge: 2020-07-16 | Disposition: A | Discharge status: Home / Self Care | Payer: BC Managed Care – PPO | Source: Ambulatory Visit | Attending: General Surgery | Admitting: General Surgery

## 2020-07-16 ENCOUNTER — Encounter (HOSPITAL_BASED_OUTPATIENT_CLINIC_OR_DEPARTMENT_OTHER): Payer: Self-pay | Admitting: General Surgery

## 2020-07-16 ENCOUNTER — Encounter (HOSPITAL_BASED_OUTPATIENT_CLINIC_OR_DEPARTMENT_OTHER)
Admission: RE | Disposition: A | Discharge status: Home / Self Care | Payer: Self-pay | Source: Ambulatory Visit | Attending: General Surgery

## 2020-07-16 DIAGNOSIS — Z791 Long term (current) use of non-steroidal anti-inflammatories (NSAID): Secondary | ICD-10-CM | POA: Insufficient documentation

## 2020-07-16 DIAGNOSIS — N400 Enlarged prostate without lower urinary tract symptoms: Secondary | ICD-10-CM | POA: Diagnosis not present

## 2020-07-16 DIAGNOSIS — F17210 Nicotine dependence, cigarettes, uncomplicated: Secondary | ICD-10-CM | POA: Insufficient documentation

## 2020-07-16 DIAGNOSIS — I10 Essential (primary) hypertension: Secondary | ICD-10-CM | POA: Insufficient documentation

## 2020-07-16 DIAGNOSIS — K4090 Unilateral inguinal hernia, without obstruction or gangrene, not specified as recurrent: Secondary | ICD-10-CM | POA: Diagnosis not present

## 2020-07-16 DIAGNOSIS — Z79899 Other long term (current) drug therapy: Secondary | ICD-10-CM | POA: Diagnosis not present

## 2020-07-16 DIAGNOSIS — E785 Hyperlipidemia, unspecified: Secondary | ICD-10-CM | POA: Diagnosis not present

## 2020-07-16 DIAGNOSIS — Z7982 Long term (current) use of aspirin: Secondary | ICD-10-CM | POA: Insufficient documentation

## 2020-07-16 HISTORY — DX: Unilateral inguinal hernia, without obstruction or gangrene, not specified as recurrent: K40.90

## 2020-07-16 HISTORY — PX: INGUINAL HERNIA REPAIR: SHX194

## 2020-07-16 LAB — POCT I-STAT, CHEM 8
BUN: 18 mg/dL (ref 8–23)
Calcium, Ion: 1.25 mmol/L (ref 1.15–1.40)
Chloride: 103 mmol/L (ref 98–111)
Creatinine, Ser: 1.2 mg/dL (ref 0.61–1.24)
Glucose, Bld: 112 mg/dL — ABNORMAL HIGH (ref 70–99)
HCT: 42 % (ref 39.0–52.0)
Hemoglobin: 14.3 g/dL (ref 13.0–17.0)
Potassium: 4.1 mmol/L (ref 3.5–5.1)
Sodium: 142 mmol/L (ref 135–145)
TCO2: 28 mmol/L (ref 22–32)

## 2020-07-16 SURGERY — REPAIR, HERNIA, INGUINAL, LAPAROSCOPIC
Anesthesia: General | Laterality: Right

## 2020-07-16 MED ORDER — BUPIVACAINE LIPOSOME 1.3 % IJ SUSP: 20.0000 mL | Freq: Once | INTRAMUSCULAR | Status: DC

## 2020-07-16 MED ORDER — OXYCODONE HCL 5 MG/5ML PO SOLN: 5.0000 mg | Freq: Once | ORAL | Status: AC | PRN

## 2020-07-16 MED ORDER — MIDAZOLAM HCL 2 MG/2ML IJ SOLN
INTRAMUSCULAR | Status: AC
  Filled 2020-07-16: qty 2

## 2020-07-16 MED ORDER — ROCURONIUM BROMIDE 10 MG/ML (PF) SYRINGE
PREFILLED_SYRINGE | INTRAVENOUS | Status: DC | PRN
  Administered 2020-07-16: 50 mg via INTRAVENOUS

## 2020-07-16 MED ORDER — OXYCODONE HCL 5 MG PO TABS
5.0000 mg | ORAL_TABLET | Freq: Once | ORAL | Status: AC | PRN
  Administered 2020-07-16: 5 mg via ORAL

## 2020-07-16 MED ORDER — BUPIVACAINE LIPOSOME 1.3 % IJ SUSP
INTRAMUSCULAR | Status: DC | PRN
  Administered 2020-07-16: 20 mL

## 2020-07-16 MED ORDER — LIDOCAINE 2% (20 MG/ML) 5 ML SYRINGE
INTRAMUSCULAR | Status: DC | PRN
  Administered 2020-07-16: 100 mg via INTRAVENOUS

## 2020-07-16 MED ORDER — CEFAZOLIN SODIUM-DEXTROSE 2-4 GM/100ML-% IV SOLN
2.0000 g | INTRAVENOUS | Status: AC
  Administered 2020-07-16: 2 g via INTRAVENOUS

## 2020-07-16 MED ORDER — CEFAZOLIN SODIUM-DEXTROSE 2-4 GM/100ML-% IV SOLN
INTRAVENOUS | Status: AC
  Filled 2020-07-16: qty 100

## 2020-07-16 MED ORDER — LIDOCAINE 2% (20 MG/ML) 5 ML SYRINGE
INTRAMUSCULAR | Status: AC
  Filled 2020-07-16: qty 5

## 2020-07-16 MED ORDER — FENTANYL CITRATE (PF) 100 MCG/2ML IJ SOLN
INTRAMUSCULAR | Status: AC
  Filled 2020-07-16: qty 2

## 2020-07-16 MED ORDER — BUPIVACAINE HCL 0.5 % IJ SOLN
INTRAMUSCULAR | Status: DC | PRN
  Administered 2020-07-16: 30 mL

## 2020-07-16 MED ORDER — AMISULPRIDE (ANTIEMETIC) 5 MG/2ML IV SOLN: 10.0000 mg | Freq: Once | INTRAVENOUS | Status: DC | PRN

## 2020-07-16 MED ORDER — ONDANSETRON HCL 4 MG/2ML IJ SOLN: 4.0000 mg | Freq: Once | INTRAMUSCULAR | Status: DC | PRN

## 2020-07-16 MED ORDER — PROPOFOL 10 MG/ML IV BOLUS
INTRAVENOUS | Status: DC | PRN
  Administered 2020-07-16: 200 mg via INTRAVENOUS

## 2020-07-16 MED ORDER — CHLORHEXIDINE GLUCONATE CLOTH 2 % EX PADS: 6.0000 | MEDICATED_PAD | Freq: Once | CUTANEOUS | Status: DC

## 2020-07-16 MED ORDER — ONDANSETRON HCL 4 MG/2ML IJ SOLN
INTRAMUSCULAR | Status: DC | PRN
  Administered 2020-07-16: 4 mg via INTRAVENOUS

## 2020-07-16 MED ORDER — PROPOFOL 10 MG/ML IV BOLUS
INTRAVENOUS | Status: AC
  Filled 2020-07-16: qty 20

## 2020-07-16 MED ORDER — MIDAZOLAM HCL 2 MG/2ML IJ SOLN
INTRAMUSCULAR | Status: DC | PRN
  Administered 2020-07-16: 2 mg via INTRAVENOUS

## 2020-07-16 MED ORDER — ACETAMINOPHEN 500 MG PO TABS
1000.0000 mg | ORAL_TABLET | ORAL | Status: AC
  Administered 2020-07-16: 1000 mg via ORAL

## 2020-07-16 MED ORDER — FENTANYL CITRATE (PF) 100 MCG/2ML IJ SOLN
INTRAMUSCULAR | Status: DC | PRN
Start: 2020-07-16 — End: 2020-07-16
  Administered 2020-07-16 (×2): 50 ug via INTRAVENOUS

## 2020-07-16 MED ORDER — OXYCODONE HCL 5 MG PO TABS
ORAL_TABLET | ORAL | Status: AC
  Filled 2020-07-16: qty 1

## 2020-07-16 MED ORDER — LACTATED RINGERS IV SOLN: INTRAVENOUS | Status: DC

## 2020-07-16 MED ORDER — ACETAMINOPHEN 500 MG PO TABS
ORAL_TABLET | ORAL | Status: AC
  Filled 2020-07-16: qty 2

## 2020-07-16 MED ORDER — DEXAMETHASONE SODIUM PHOSPHATE 10 MG/ML IJ SOLN
INTRAMUSCULAR | Status: DC | PRN
  Administered 2020-07-16: 5 mg via INTRAVENOUS

## 2020-07-16 MED ORDER — SUGAMMADEX SODIUM 200 MG/2ML IV SOLN
INTRAVENOUS | Status: DC | PRN
  Administered 2020-07-16: 200 mg via INTRAVENOUS

## 2020-07-16 MED ORDER — FENTANYL CITRATE (PF) 100 MCG/2ML IJ SOLN
25.0000 ug | INTRAMUSCULAR | Status: DC | PRN
  Administered 2020-07-16: 25 ug via INTRAVENOUS
  Administered 2020-07-16: 50 ug via INTRAVENOUS
  Administered 2020-07-16: 25 ug via INTRAVENOUS

## 2020-07-16 MED ORDER — HYDROCODONE-ACETAMINOPHEN 5-325 MG PO TABS: 1.0000 | ORAL_TABLET | Freq: Four times a day (QID) | ORAL | 0 refills | Status: DC | PRN

## 2020-07-16 SURGICAL SUPPLY — 36 items
ADH SKN CLS APL DERMABOND .7 (GAUZE/BANDAGES/DRESSINGS) ×1
APL PRP STRL LF DISP 70% ISPRP (MISCELLANEOUS) ×1
BLADE CLIPPER SENSICLIP SURGIC (BLADE) ×2 IMPLANT
CABLE HIGH FREQUENCY MONO STRZ (ELECTRODE) ×2 IMPLANT
CHLORAPREP W/TINT 26 (MISCELLANEOUS) ×2 IMPLANT
COVER WAND RF STERILE (DRAPES) ×2 IMPLANT
DECANTER SPIKE VIAL GLASS SM (MISCELLANEOUS) ×2 IMPLANT
DERMABOND ADVANCED (GAUZE/BANDAGES/DRESSINGS) ×1
DERMABOND ADVANCED .7 DNX12 (GAUZE/BANDAGES/DRESSINGS) ×1 IMPLANT
DEVICE SECURE STRAP 25 ABSORB (INSTRUMENTS) ×2 IMPLANT
DRSG COVADERM PLUS 2X2 (GAUZE/BANDAGES/DRESSINGS) IMPLANT
ELECT REM PT RETURN 9FT ADLT (ELECTROSURGICAL) ×2
ELECTRODE REM PT RTRN 9FT ADLT (ELECTROSURGICAL) ×1 IMPLANT
GLOVE BIOGEL PI IND STRL 7.0 (GLOVE) ×1 IMPLANT
GLOVE BIOGEL PI INDICATOR 7.0 (GLOVE) ×1
GLOVE SURG SS PI 7.0 STRL IVOR (GLOVE) ×2 IMPLANT
GOWN STRL REUS W/TWL LRG LVL3 (GOWN DISPOSABLE) ×2 IMPLANT
GRASPER SUT TROCAR 14GX15 (MISCELLANEOUS) IMPLANT
IRRIG SUCT STRYKERFLOW 2 WTIP (MISCELLANEOUS) ×2
IRRIGATION SUCT STRKRFLW 2 WTP (MISCELLANEOUS) ×1 IMPLANT
KIT TURNOVER CYSTO (KITS) ×2 IMPLANT
MESH 3DMAX 5X7 RT XLRG (Mesh General) ×2 IMPLANT
PACK BASIN DAY SURGERY FS (CUSTOM PROCEDURE TRAY) ×2 IMPLANT
SCISSORS LAP 5X35 DISP (ENDOMECHANICALS) IMPLANT
SET TUBE SMOKE EVAC HIGH FLOW (TUBING) ×2 IMPLANT
SHEARS HARMONIC ACE PLUS 36CM (ENDOMECHANICALS) IMPLANT
SUT MNCRL AB 4-0 PS2 18 (SUTURE) ×2 IMPLANT
SUT VIC AB 2-0 SH 27 (SUTURE)
SUT VIC AB 2-0 SH 27XBRD (SUTURE) IMPLANT
SUT VICRYL 0 UR6 27IN ABS (SUTURE) IMPLANT
SUT VLOC BARB 180 ABS3/0GR12 (SUTURE)
SUTURE VLOC BRB 180 ABS3/0GR12 (SUTURE) IMPLANT
TOWEL OR 17X26 10 PK STRL BLUE (TOWEL DISPOSABLE) ×2 IMPLANT
TRAY LAPAROSCOPIC (CUSTOM PROCEDURE TRAY) ×2 IMPLANT
TROCAR BLADELESS OPT 12M 100M (ENDOMECHANICALS) ×2 IMPLANT
TROCAR BLADELESS OPT 5 100 (ENDOMECHANICALS) ×4 IMPLANT

## 2020-07-16 NOTE — H&P (Addendum)
Raymond Mitchell is an 64 y.o. male.   Chief Complaint: hernias HPI: 64 yo male with intermittent groin pain and findings of right inguinal hernia. He presents today for repair.  Past Medical History:  Diagnosis Date  . Ankylosing spondylitis ([place])   . BPH (benign prostatic hyperplasia)    hx of  . Diverticulosis   . Lymphocytic colitis 01/15/2018  . Pes planus   . Right inguinal hernia   . Tobacco user   . Varicose veins     Past Surgical History:  Procedure Laterality Date  . BACK SURGERY  12/2007   L 4 to L 5 fusion  . BACK SURGERY  2012   disc repair  . c spine ESI     [name]  . INGUINAL HERNIA REPAIR  1994  . RTC surgery Left 2005    Family History  Problem Relation Age of Onset  . Hyperlipidemia Mother   . Hypertension Mother   . Diabetes Mother   . Hyperlipidemia Father   . Hypertension Father   . Other Father        ank spondy  . Other Brother        Brain tumor, CVA  . Other Brother        ank spondy   Social History:  reports that he has been smoking. He has a 40.00 pack-year smoking history. He has never used smokeless tobacco. He reports current alcohol use of about 21.0 standard drinks of alcohol per week. He reports previous drug use.  Allergies:  Allergies  Allergen Reactions  . Nsaids Other (See Comments)    Per gastroenterologist     Medications Prior to Admission  Medication Sig Dispense Refill  . Apoaequorin (PREVAGEN PO) Take by mouth daily.    . Ascorbic Acid (VITAMIN C WITH ROSE HIPS) 500 MG tablet Take 500 mg by mouth daily.    Marland Kitchen aspirin 81 MG tablet Take 81 mg by mouth daily.     Marland Kitchen atorvastatin (LIPITOR) 40 MG tablet TAKE 1 TABLET BY MOUTH EVERY DAY (Patient taking differently: at bedtime. ) 90 tablet 3  . Calcium Carbonate-Vitamin D (CALTRATE 600+D) 600-400 MG-UNIT per tablet Take 1 tablet by mouth daily.      . celecoxib (CELEBREX) 200 MG capsule TAKE 1-2 CAPSULES (200-400 MG TOTAL) BY MOUTH DAILY. 90 capsule 1  . Esomeprazole  Magnesium (NEXIUM PO) Take by mouth.    . fish oil-omega-3 fatty acids 1000 MG capsule Take by mouth. 1 capsules po daily    . gabapentin (NEURONTIN) 300 MG capsule Take 1 capsule (300 mg total) by mouth 3 (three) times daily. TAKE ONE CAPSULE BY MOUTH 1 TO 3 TIMES DAILY (Patient taking differently: Take 300 mg by mouth 3 (three) times daily. TAKE ONE CAPSULE BY MOUTH 1  In am TO 2 in pm) 270 capsule 3  . HYDROcodone-acetaminophen (NORCO) 10-325 MG tablet Take by mouth.    Marland Kitchen ipratropium (ATROVENT) 0.03 % nasal spray PLACE 2 SPRAYS INTO BOTH NOSTRILS EVERY 12 (TWELVE) HOURS. (Patient taking differently: Place 2 sprays into both nostrils daily. ) 30 mL 4  . lisinopril (ZESTRIL) 10 MG tablet TAKE 1 TABLET BY MOUTH EVERY DAY (Patient taking differently: at bedtime. ) 90 tablet 1  . metoprolol succinate (TOPROL-XL) 100 MG 24 hr tablet TAKE 1 TABLET BY MOUTH EVERY DAY (Patient taking differently: at bedtime. ) 90 tablet 1  . multivitamin (THERAGRAN) per tablet Take 1 tablet by mouth daily.     Marland Kitchen  Potassium 99 MG TABS Take 2 tablets by mouth daily. 2 tabs in am, 1 tab at hs    . senna-docusate (SENEXON-S) 8.6-50 MG tablet Take 2 tablets by mouth at bedtime. 60 tablet 6  . tamsulosin (FLOMAX) 0.4 MG CAPS capsule TAKE 1 CAPSULE BY MOUTH EVERY DAY (Patient taking differently: at bedtime. ) 90 capsule 1  . Turmeric (QC TUMERIC COMPLEX PO) Take by mouth daily.    Marland Kitchen UNABLE TO FIND Med Name: vitamin b 1 in am    . UNABLE TO FIND Med Name:equate stool softener prn    . Zinc Sulfate (ZINC 15 PO) Take by mouth daily.    Marland Kitchen ketoconazole (NIZORAL) 2 % shampoo DAILY FOR ONE WEEK, THEN 2-3 TIMES A WEEK FOR MAINTENANCE. 240 mL 2    Results for orders placed or performed during the hospital encounter of 07/16/20 (from the past 48 hour(s))  I-STAT, chem 8     Status: Abnormal   Collection Time: 07/16/20  8:45 AM  Result Value Ref Range   Sodium 142 135 - 145 mmol/L   Potassium 4.1 3.5 - 5.1 mmol/L   Chloride 103 98 -  111 mmol/L   BUN 18 8 - 23 mg/dL   Creatinine, Ser 1.20 0.61 - 1.24 mg/dL   Glucose, Bld 112 (H) 70 - 99 mg/dL    Comment: Glucose reference range applies only to samples taken after fasting for at least 8 hours.   Calcium, Ion 1.25 1.15 - 1.40 mmol/L   TCO2 28 22 - 32 mmol/L   Hemoglobin 14.3 13.0 - 17.0 g/dL   HCT 42.0 39 - 52 %   No results found.  Review of Systems  Constitutional: Negative for chills and fever.  HENT: Negative for hearing loss.   Respiratory: Negative for cough.   Cardiovascular: Negative for chest pain and palpitations.  Gastrointestinal: Negative for abdominal pain, nausea and vomiting.  Genitourinary: Negative for dysuria and urgency.  Musculoskeletal: Negative for myalgias and neck pain.  Skin: Negative for rash.  Neurological: Negative for dizziness and headaches.  Hematological: Does not bruise/bleed easily.  Psychiatric/Behavioral: Negative for suicidal ideas.    Blood pressure 129/78, pulse (!) 55, temperature 98.1 F (36.7 C), temperature source Oral, resp. rate 16, height 6\' 1"  (1.854 m), weight 84.8 kg, SpO2 99 %. Physical Exam Vitals reviewed.  Constitutional:      Appearance: He is well-developed.  HENT:     Head: Normocephalic and atraumatic.  Eyes:     Conjunctiva/sclera: Conjunctivae normal.     Pupils: Pupils are equal, round, and reactive to light.  Cardiovascular:     Rate and Rhythm: Normal rate and regular rhythm.  Pulmonary:     Effort: Pulmonary effort is normal.     Breath sounds: Normal breath sounds.  Abdominal:     General: Bowel sounds are normal. There is no distension.     Palpations: Abdomen is soft.     Tenderness: There is no abdominal tenderness.     Comments: Right inguinal hernia  Musculoskeletal:        General: Normal range of motion.     Cervical back: Normal range of motion and neck supple.  Skin:    General: Skin is warm and dry.  Neurological:     Mental Status: He is alert and oriented to person,  place, and time.  Psychiatric:        Behavior: Behavior normal.      Assessment/Plan 64 yo male with rightinguinal hernia -  laparoscopic right inguinal hernia repair with mesh -ERAS protocol -planned outpatient procedure  Mickeal Skinner, MD 07/16/2020, 10:41 AM

## 2020-07-16 NOTE — Anesthesia Procedure Notes (Signed)
Procedure Name: Intubation Date/Time: 07/16/2020 10:53 AM Performed by: Suan Halter, CRNA Pre-anesthesia Checklist: Patient identified, Emergency Drugs available, Suction available and Patient being monitored Patient Re-evaluated:Patient Re-evaluated prior to induction Oxygen Delivery Method: Circle system utilized Preoxygenation: Pre-oxygenation with 100% oxygen Induction Type: IV induction Ventilation: Mask ventilation without difficulty Laryngoscope Size: Mac and 3 Grade View: Grade I Tube type: Oral Tube size: 7.0 mm Number of attempts: 1 Airway Equipment and Method: Stylet and Oral airway Placement Confirmation: ETT inserted through vocal cords under direct vision,  positive ETCO2 and breath sounds checked- equal and bilateral Secured at: 22 cm Tube secured with: Tape Dental Injury: Teeth and Oropharynx as per pre-operative assessment

## 2020-07-16 NOTE — Anesthesia Preprocedure Evaluation (Signed)
Anesthesia Evaluation  Patient identified by MRN, date of birth, ID band Patient awake    Reviewed: Allergy & Precautions, NPO status , Patient's Chart, lab work & pertinent test results  History of Anesthesia Complications Negative for: history of anesthetic complications  Airway Mallampati: II  TM Distance: >3 FB Neck ROM: Full    Dental  (+) Teeth Intact   Pulmonary Current SmokerPatient did not abstain from smoking.,    Pulmonary exam normal        Cardiovascular hypertension, Normal cardiovascular exam     Neuro/Psych negative psych ROS   GI/Hepatic hiatal hernia, (+)     substance abuse  alcohol use,   Endo/Other  negative endocrine ROS  Renal/GU negative Renal ROS  negative genitourinary   Musculoskeletal negative musculoskeletal ROS (+)   Abdominal   Peds  Hematology negative hematology ROS (+)   Anesthesia Other Findings  Stress echo 2012: hypertensive response to stress, no inducible ischemia, EF 60-65%  Reproductive/Obstetrics                             Anesthesia Physical Anesthesia Plan  ASA: II  Anesthesia Plan: General   Post-op Pain Management:    Induction: Intravenous  PONV Risk Score and Plan: 2 and Ondansetron, Dexamethasone, Treatment may vary due to age or medical condition and Midazolam  Airway Management Planned: Oral ETT  Additional Equipment: None  Intra-op Plan:   Post-operative Plan: Extubation in OR  Informed Consent: I have reviewed the patients History and Physical, chart, labs and discussed the procedure including the risks, benefits and alternatives for the proposed anesthesia with the patient or authorized representative who has indicated his/her understanding and acceptance.     Dental advisory given  Plan Discussed with:   Anesthesia Plan Comments:         Anesthesia Quick Evaluation

## 2020-07-16 NOTE — Op Note (Signed)
Preop diagnosis: right inguinal hernia  Postop diagnosis: right indirect and direct inguinal hernia  Procedure: laparoscopic Right inguinal hernia repair with mesh  Surgeon: Gurney Maxin, M.D.  Asst: none  Anesthesia: Gen.   Indications for procedure: Raymond Mitchell is a 64 y.o. male with symptoms of pain and enlarging Right inguinal hernia(s). After discussing risks, alternatives and benefits he decided on laparoscopic repair and was brought to day surgery for repair.  Description of procedure: The patient was brought into the operative suite, placed supine. Anesthesia was administered with endotracheal tube. Patient was strapped in place. The patient was prepped and draped in the usual sterile fashion.  Next Exparel:Marcaine mix was injected to the left of the umbilicus, and a transverse 2 cm incision was made. Dissection was used to visualize the anterior rectus sheath. Anterior rectus sheath was sharply incised, next to the 12 mm trocar was inserted into the preperitoneal space. Laparoscope was inserted to confirm placement and then used to bluntly dissect the retrorectus space clear. Once the midline was free 2 24mm trocars were placed. 1 in the suprapubic area and 1in 5cm superior to the first.  On initial visualization, There was a direct and indirect hernia. Next I began our dissection identifying the ASIS laterally and then working back medially removing the filmy tissue and adhesions of the peritoneum to the abdominal wall. Hernia sac was completely dissected out of the canal. Vas deference and contents of the cord were safely dissected away of the hernia sac. The Exparel mix was infused along the transversus abdominis planes laterally and near the lacunar ligament medially.  A extra large right 3D max mesh was inserted and tacked medially to the lacunar ligament. The mesh was positioned flat and directly up against the direct and indirect areas. The CO2 was evacuated while  watching to ensure the mesh did not migrate.. The anterior rectus fascia was closed with 0 vicryl in interrupted sutures and all skin incisions were closed with 4-0 monocryl subcu stitch. The patient awoke from anesthesia and was brought to PACU in stable condition.  Findings: right direct and indirect inguinal hernia  Specimen: none  Blood loss: 10 ml  Local anesthesia: 50 ml Exparel:Marcaine mix  Complications: none  Implant: extra large right Bard 3D max mesh  Images:      Gurney Maxin, M.D. General, Bariatric, & Minimally Invasive Surgery [facility], [place] 11:50 AM 07/16/2020

## 2020-07-16 NOTE — Anesthesia Postprocedure Evaluation (Signed)
Anesthesia Post Note  Patient: Raymond Mitchell  Procedure(s) Performed: LAPAROSCOPIC RIGHT INGUINAL HERNIA WITH MESH (Right )     Patient location during evaluation: PACU Anesthesia Type: General Level of consciousness: awake and alert Pain management: pain level controlled Vital Signs Assessment: post-procedure vital signs reviewed and stable Respiratory status: spontaneous breathing, nonlabored ventilation and respiratory function stable Cardiovascular status: blood pressure returned to baseline and stable Postop Assessment: no apparent nausea or vomiting Anesthetic complications: no   No complications documented.  Last Vitals:  Vitals:   07/16/20 1300 07/16/20 1315  BP: (!) 143/85   Pulse: (!) 50   Resp: (!) 9   Temp:  36.5 C  SpO2: 99%     Last Pain:  Vitals:   07/16/20 1315  TempSrc:   PainSc: 4                  Lidia Collum

## 2020-07-16 NOTE — Transfer of Care (Signed)
Immediate Anesthesia Transfer of Care Note  Patient: Raymond Mitchell  Procedure(s) Performed: Procedure(s) (LRB): LAPAROSCOPIC RIGHT INGUINAL HERNIA WITH MESH (Right)  Patient Location: PACU  Anesthesia Type: General  Level of Consciousness: awake, oriented, sedated and patient cooperative  Airway & Oxygen Therapy: Patient Spontanous Breathing and Patient connected to face mask oxygen  Post-op Assessment: Report given to PACU RN and Post -op Vital signs reviewed and stable  Post vital signs: Reviewed and stable  Complications: No apparent anesthesia complications  Last Vitals:  Vitals Value Taken Time  BP 159/106 07/16/20 1200  Temp    Pulse 65 07/16/20 1203  Resp 15 07/16/20 1203  SpO2 98 % 07/16/20 1203  Vitals shown include unvalidated device data.  Last Pain:  Vitals:   07/16/20 0854  TempSrc: Oral  PainSc: 0-No pain      Patients Stated Pain Goal: 4 (42/10/31 2811)  Complications: No complications documented.

## 2020-07-16 NOTE — Discharge Instructions (Signed)
[facility] _____[facility]  UMBILICAL OR INGUINAL HERNIA REPAIR: POST OP INSTRUCTIONS  Always review your discharge instruction sheet given to you by the facility where your surgery was performed. IF YOU HAVE DISABILITY OR FAMILY LEAVE FORMS, YOU MUST BRING THEM TO THE OFFICE FOR PROCESSING.   DO NOT GIVE THEM TO YOUR DOCTOR.  1. A  prescription for pain medication may be given to you upon discharge.  Take your pain medication as prescribed, if needed.  If narcotic pain medicine is not needed, then you may take acetaminophen (Tylenol) or ibuprofen (Advil) as needed. 2. Take your usually prescribed medications unless otherwise directed. If you need a refill on your pain medication, please contact your pharmacy.  They will contact our office to request authorization. Prescriptions will not be filled after 5 pm or on week-ends. 3. You should follow a light diet the first 24 hours after arrival home, such as soup and crackers, etc.  Be sure to include lots of fluids daily.  Resume your normal diet the day after surgery. 4.Most patients will experience some swelling and bruising around the umbilicus or in the groin and scrotum.  Ice packs and reclining will help.  Swelling and bruising can take several days to resolve.  6. It is common to experience some constipation if taking pain medication after surgery.  Increasing fluid intake and taking a stool softener (such as Colace) will usually help or prevent this problem from occurring.  A mild laxative (Milk of Magnesia or Miralax) should be taken according to package directions if there are no bowel movements after 48 hours. 7. Unless discharge instructions indicate otherwise, you may remove your bandages 24-48 hours after surgery, and you may shower at that time.  You may have steri-strips (small skin tapes) in place directly over the incision.  These strips should be left on the skin for 7-10 days.  If your surgeon used skin glue on the  incision, you may shower in 24 hours.  The glue will flake off over the next 2-3 weeks.  Any sutures or staples will be removed at the office during your follow-up visit. 8. ACTIVITIES:  You may resume regular (light) daily activities beginning the next day--such as daily self-care, walking, climbing stairs--gradually increasing activities as tolerated.  You may have sexual intercourse when it is comfortable.  Refrain from any heavy lifting or straining until approved by your doctor.  a.You may drive when you are no longer taking prescription pain medication, you can comfortably wear a seatbelt, and you can safely maneuver your car and apply brakes. b.RETURN TO WORK:   _____________________________________________  9.You should see your doctor in the office for a follow-up appointment approximately 2-3 weeks after your surgery.  Make sure that you call for this appointment within a day or two after you arrive home to insure a convenient appointment time. 10.OTHER INSTRUCTIONS: _________________________    _____________________________________  WHEN TO CALL YOUR DOCTOR: 1. Fever over 101.0 2. Inability to urinate 3. Nausea and/or vomiting 4. Extreme swelling or bruising 5. Continued bleeding from incision. 6. Increased pain, redness, or drainage from the incision  The clinic staff is available to answer your questions during regular business hours.  Please don't hesitate to call and ask to speak to one of the nurses for clinical concerns.  If you have a medical emergency, go to the nearest emergency room or call 911.  A surgeon from [facility] is always on call at the hospital     [address] [place], [place], [place]  [address] ?  [address], [place], [place]   [address] [phone] ? [phone] ? FAX [phone] Web site: [email or web address] Information for Discharge Teaching: EXPAREL (bupivacaine liposome injectable suspension)   Your surgeon  gave you EXPAREL(bupivacaine) in your surgical incision to help control your pain after surgery.   EXPAREL is a local anesthetic that provides pain relief by numbing the tissue around the surgical site.  EXPAREL is designed to release pain medication over time and can control pain for up to 72 hours.  Depending on how you respond to EXPAREL, you may require less pain medication during your recovery.  Possible side effects:  Temporary loss of sensation or ability to move in the area where bupivacaine was injected.  Nausea, vomiting, constipation  Rarely, numbness and tingling in your mouth or lips, lightheadedness, or anxiety may occur.  Call your doctor right away if you think you may be experiencing any of these sensations, or if you have other questions regarding possible side effects.  Follow all other discharge instructions given to you by your surgeon or nurse. Eat a healthy diet and drink plenty of water or other fluids.  If you return to the hospital for any reason within 96 hours following the administration of EXPAREL, please inform your health care providers. Post Anesthesia Home Care Instructions  Activity: Get plenty of rest for the remainder of the day. A responsible adult should stay with you for 24 hours following the procedure.  For the next 24 hours, DO NOT: -Drive a car -Paediatric nurse -Drink alcoholic beverages -Take any medication unless instructed by your physician -Make any legal decisions or sign important papers.  Meals: Start with liquid foods such as gelatin or soup. Progress to regular foods as tolerated. Avoid greasy, spicy, heavy foods. If nausea and/or vomiting occur, drink only clear liquids until the nausea and/or vomiting subsides. Call your physician if vomiting continues.  Special Instructions/Symptoms: Your throat may feel dry or sore from the anesthesia or the breathing tube placed in your throat during surgery. If this causes discomfort,  gargle with warm salt water. The discomfort should disappear within 24 hours.  If you had a scopolamine patch placed behind your ear for the management of post- operative nausea and/or vomiting:  1. The medication in the patch is effective for 72 hours, after which it should be removed.  Wrap patch in a tissue and discard in the trash. Wash hands thoroughly with soap and water. 2. You may remove the patch earlier than 72 hours if you experience unpleasant side effects which may include dry mouth, dizziness or visual disturbances. 3. Avoid touching the patch. Wash your hands with soap and water after contact with the patch.

## 2020-07-19 ENCOUNTER — Encounter (HOSPITAL_BASED_OUTPATIENT_CLINIC_OR_DEPARTMENT_OTHER): Payer: Self-pay | Admitting: General Surgery

## 2020-07-19 ENCOUNTER — Other Ambulatory Visit: Payer: Self-pay | Admitting: Family Medicine

## 2020-07-22 ENCOUNTER — Other Ambulatory Visit: Payer: Self-pay | Admitting: Family Medicine

## 2020-08-07 ENCOUNTER — Other Ambulatory Visit: Payer: Self-pay | Admitting: Family Medicine

## 2020-08-07 DIAGNOSIS — M5412 Radiculopathy, cervical region: Secondary | ICD-10-CM

## 2020-08-11 ENCOUNTER — Telehealth (INDEPENDENT_AMBULATORY_CARE_PROVIDER_SITE_OTHER): Payer: BC Managed Care – PPO | Admitting: Physician Assistant

## 2020-08-11 ENCOUNTER — Encounter: Payer: Self-pay | Admitting: Physician Assistant

## 2020-08-11 VITALS — Temp 97.0°F | Ht 73.0 in | Wt 187.0 lb

## 2020-08-11 DIAGNOSIS — J329 Chronic sinusitis, unspecified: Secondary | ICD-10-CM | POA: Diagnosis not present

## 2020-08-11 DIAGNOSIS — J40 Bronchitis, not specified as acute or chronic: Secondary | ICD-10-CM

## 2020-08-11 DIAGNOSIS — F17200 Nicotine dependence, unspecified, uncomplicated: Secondary | ICD-10-CM | POA: Diagnosis not present

## 2020-08-11 MED ORDER — ALBUTEROL SULFATE HFA 108 (90 BASE) MCG/ACT IN AERS: 2.0000 | INHALATION_SPRAY | Freq: Four times a day (QID) | RESPIRATORY_TRACT | 0 refills | Status: DC | PRN

## 2020-08-11 MED ORDER — HYDROCOD POLST-CPM POLST ER 10-8 MG/5ML PO SUER: 5.0000 mL | Freq: Two times a day (BID) | ORAL | 0 refills | Status: DC | PRN

## 2020-08-11 MED ORDER — AZITHROMYCIN 250 MG PO TABS: ORAL_TABLET | ORAL | 0 refills | Status: DC

## 2020-08-11 MED ORDER — PREDNISONE 50 MG PO TABS: 50.0000 mg | ORAL_TABLET | Freq: Every day | ORAL | 0 refills | Status: DC

## 2020-08-11 NOTE — Progress Notes (Deleted)
Started last Wednesday AM, worse the past two days Congestion Cough "tickle" in throat Worse at night  No body aches, chills, diarrhea, nausea, vomiting  Has not taken anything new OTC (takes Atrovent nasal spray daily, does help)

## 2020-08-11 NOTE — Progress Notes (Signed)
Patient ID: Raymond Mitchell, male   DOB: 10-May-1956, 64 y.o.   MRN: [id number]   .Marland KitchenVirtual Visit via Video Note  I connected with Raymond Mitchell on 08/11/20 at  9:50 AM EST by a video enabled telemedicine application and verified that I am speaking with the correct person using two identifiers.  Location: Patient: home Provider: clinic   I discussed the limitations of evaluation and management by telemedicine and the availability of in person appointments. The patient expressed understanding and agreed to proceed.  History of Present Illness: Pt is a 64 yo male with HTN who calls into the clinic with cough, sinus pressure, congestion for 1 week. He is on leave for hernia repair surgery. He has no sick contacts. He has both covid vaccines. He denies any SOB, loss of smell or taste, GI symptoms, fever, chills, body aches. He does smoke but does not have diagnosed COPD. He is not on any inhalers and not using albuterol. He is using atrovent nasal spray. Not taking other OTC medications. His sputum is productive and clear at first but now green/yellow. His cough kept him up all last night.   .. Active Ambulatory Problems    Diagnosis Date Noted  . Hyperlipidemia 01/16/2007  . DIVERTICULOSIS OF COLON 08/13/2009  . CONSTIPATION, DRUG INDUCED 10/05/2009  . Fatty liver 10/08/2009  . BPH (benign prostatic hyperplasia) 02/13/2007  . ANKYLOSING SPONDYLITIS 12/05/2006  . [place] DISEASE, CERVICAL 03/11/2007  . [place] DISEASE, LUMBAR 07/08/2007  . Essential hypertension, benign 12/05/2006  . CIGARETTE SMOKER 11/29/2010  . POSTURAL LIGHTHEADEDNESS 12/02/2010  . Left cervical radiculopathy 01/02/2011  . SCC (squamous cell carcinoma), leg 08/29/2011  . Seborrheic dermatitis 09/05/2012  . Cancer of skin, squamous cell 03/11/2014  . Hepatic cyst 11/11/2014  . Right lumbar radiculopathy 09/12/2016  . Lymphocytic colitis 01/15/2018  . BPPV (benign paroxysmal positional vertigo), left 11/26/2018  .  Allergic rhinitis due to pollen 11/26/2018  . IFG (impaired fasting glucose) 05/28/2019  . Hiatal hernia 05/28/2019  . Venous stasis 06/02/2020   Resolved Ambulatory Problems    Diagnosis Date Noted  . NEOPLASM, SKIN, UNCERTAIN BEHAVIOR 72/06/4708  . VARICOSE VEIN 12/05/2006  . RECTAL BLEEDING 12/13/2006  . LEG PAIN, RIGHT 12/05/2006  . Abdominal pain, generalized 10/07/2009  . LIVER FUNCTION TESTS, ABNORMAL 07/08/2007  . VIRAL URI 10/25/2010  . CONSTIPATION 10/25/2010  . Abdominal pain, acute, bilateral lower quadrant 11/11/2014   Past Medical History:  Diagnosis Date  . Diverticulosis   . Pes planus   . Right inguinal hernia   . Tobacco user   . Varicose veins    Reviewed med, allergy, problem list.     Observations/Objective: No acute distress Normal mood Normal breathing with productive cough.   .. Today's Vitals   08/11/20 0912  Temp: (!) 97 F (36.1 C)  TempSrc: Oral  Weight: 187 lb (84.8 kg)  Height: 6\' 1"  (1.854 m)   Body mass index is 24.67 kg/m.    Assessment and Plan: Marland KitchenMarland KitchenMayur was seen today for cough and nasal congestion.  Diagnoses and all orders for this visit:  Sinobronchitis -     albuterol (VENTOLIN HFA) 108 (90 Base) MCG/ACT inhaler; Inhale 2 puffs into the lungs every 6 (six) hours as needed. -     azithromycin (ZITHROMAX Z-PAK) 250 MG tablet; Take 2 tablets (500 mg) on  Day 1,  followed by 1 tablet (250 mg) once daily on Days 2 through 5. -     predniSONE (DELTASONE) 50 MG  tablet; Take 1 tablet (50 mg total) by mouth daily. -     chlorpheniramine-HYDROcodone ([place]) 10-8 MG/5ML SUER; Take 5 mLs by mouth every 12 (twelve) hours as needed.  Current smoker   1 week of symptoms. Sounds like sinobronchitis. Zpak, albuterol, prednisone, cough syrup given. DO NOT take cough syrup with any prescription pain medications given after surgery.  Rest and hydration. Does not sound like covid symptoms and pt would be at end of symptomatic window  at 1 week. Encouraged smoking cessation in future to prevent bronchitis. Ok to use mucinex if needed. If symptoms worsen or change please call office or follow up in [place] for any breathing issues.    Follow Up Instructions:    I discussed the assessment and treatment plan with the patient. The patient was provided an opportunity to ask questions and all were answered. The patient agreed with the plan and demonstrated an understanding of the instructions.   The patient was advised to call back or seek an in-person evaluation if the symptoms worsen or if the condition fails to improve as anticipated.  I provided 15 minutes of non-face-to-face time during this encounter.   Iran Planas, PA-C

## 2020-08-18 DIAGNOSIS — M1712 Unilateral primary osteoarthritis, left knee: Secondary | ICD-10-CM | POA: Diagnosis not present

## 2020-08-18 DIAGNOSIS — M961 Postlaminectomy syndrome, not elsewhere classified: Secondary | ICD-10-CM | POA: Diagnosis not present

## 2020-08-18 DIAGNOSIS — G894 Chronic pain syndrome: Secondary | ICD-10-CM | POA: Diagnosis not present

## 2020-08-18 DIAGNOSIS — M25561 Pain in right knee: Secondary | ICD-10-CM | POA: Diagnosis not present

## 2020-08-23 ENCOUNTER — Encounter: Payer: Self-pay | Admitting: Nurse Practitioner

## 2020-08-23 ENCOUNTER — Telehealth (INDEPENDENT_AMBULATORY_CARE_PROVIDER_SITE_OTHER): Payer: BC Managed Care – PPO | Admitting: Nurse Practitioner

## 2020-08-23 VITALS — Temp 96.5°F

## 2020-08-23 DIAGNOSIS — J0141 Acute recurrent pansinusitis: Secondary | ICD-10-CM

## 2020-08-23 DIAGNOSIS — R42 Dizziness and giddiness: Secondary | ICD-10-CM | POA: Diagnosis not present

## 2020-08-23 MED ORDER — AMOXICILLIN-POT CLAVULANATE 875-125 MG PO TABS: 1.0000 | ORAL_TABLET | Freq: Two times a day (BID) | ORAL | 0 refills | Status: DC

## 2020-08-23 NOTE — Progress Notes (Signed)
Virtual Visit via Telephone Note  I connected with  Raymond Mitchell on 08/23/20 at  2:10 PM EST by telephone and verified that I am speaking with the correct person using two identifiers.   I discussed the limitations, risks, security and privacy concerns of performing an evaluation and management service by telephone and the availability of in person appointments. I also discussed with the patient that there may be a patient responsible charge related to this service. The patient expressed understanding and agreed to proceed.  The patient is: at home I am: in the office  Subjective:    CC: Sinobronchitis  HPI: Raymond Mitchell is a 64 year old male presenting today for recurrent sinusitis and blood pressure.   He was seen 2 weeks ago for cough and mucous production. He was started on an antibiotic, given prednisone, cough syrup, and an inhaler. He reports he completed all of the medications (5 day course) and was feeling better with his symptoms. He did report that he was experiencing a little light headedness with a low blood pressure at the pain clinic noted on Wednesday.   On Saturday he began to develop symptoms of sinusitis again with sinus pain and pressure, drainage, yellow mucous production, post nasal drip, and congestion. He states that this has been ongoing since that time. He is also experiencing some dizziness again over the end of this week.   He denies fever, cough, shortness of breath, chills, loss of taste, loss of smell.    Past medical history, Surgical history, Family history not pertinant except as noted below, Social history, Allergies, and medications have been entered into the medical record, reviewed, and corrections made.   Review of Systems:  All review of systems negative except what is listed in the HPI  Objective:    General: Speaking clearly in complete sentences without any shortness of breath. He is audible congested. Alert and oriented x3.  Normal  judgment. No apparent acute distress.  Impression and Recommendations:    1. Acute recurrent pansinusitis Recurrent sinusitis, most likely of bacterial origin given the initial improvement of symptoms and then return of sinus pain and pressure with discolored mucous production.  Will start treatment with augmentin. Supportive management discussed with patient.  Follow-up if symptoms worsen or fail to improve.   - amoxicillin-clavulanate (AUGMENTIN) 875-125 MG tablet; Take 1 tablet by mouth 2 (two) times daily.  Dispense: 10 tablet; Refill: 0  2. Dizziness Symptoms consistent with low blood pressure and given most recent readings a pain clinic, I feel that this could be an ongoing issue. This also could be exacerbated by his sinusitis and increased pressure on the inner ear.  He does not have a working blood pressure cuff at home- but does plan to take his blood pressure when at [facility] to pick up his prescription- he will call with the readings. For now, plan to stop lisinopril for tonight and tomorrow night and call the clinic Wednesday morning to report if his symptoms have improved.  If dizziness is improved, will plan to remain off of lisinopril and follow-up Monday with PCP.  If dizziness not improved, he will need further evaluation in person.        I discussed the assessment and treatment plan with the patient. The patient was provided an opportunity to ask questions and all were answered. The patient agreed with the plan and demonstrated an understanding of the instructions.   The patient was advised to call back or seek an  in-person evaluation if the symptoms worsen or if the condition fails to improve as anticipated.  I provided 20 minutes of non-face-to-face time during this TELEPHONE encounter.    Orma Render, NP

## 2020-08-24 ENCOUNTER — Telehealth: Payer: Self-pay

## 2020-08-24 DIAGNOSIS — R059 Cough, unspecified: Secondary | ICD-10-CM

## 2020-08-24 DIAGNOSIS — J0141 Acute recurrent pansinusitis: Secondary | ICD-10-CM

## 2020-08-24 MED ORDER — PREDNISONE 50 MG PO TABS: 50.0000 mg | ORAL_TABLET | Freq: Every day | ORAL | 0 refills | Status: DC

## 2020-08-24 NOTE — Telephone Encounter (Signed)
Pt called and LVM stating that his cough has gotten worse since his VV yesterday. He said that he did pick up and start the amoxicillin, but the cough is bad. He said that he is coughing a lot more than when he had the VV, and that it is worse at night. Pt is wanting to know what to do.

## 2020-08-24 NOTE — Telephone Encounter (Signed)
Pt aware Rx has been sent to the pharmacy for him and to reach out if his sx get worse or fail to improve after 5 day burst. No further questions or concerns at this time.

## 2020-08-24 NOTE — Telephone Encounter (Signed)
Pt called back requesting a Rx for hydrocodone cough syrup so he can sleep tonight.

## 2020-08-24 NOTE — Telephone Encounter (Signed)
There is an illness going around with severe cough. Sent prednisone 50mg  burst for 5 days. If symptoms worsen or fail to improve will need evaluation next week.

## 2020-08-25 MED ORDER — HYDROCOD POLST-CPM POLST ER 10-8 MG/5ML PO SUER: 5.0000 mL | Freq: Two times a day (BID) | ORAL | 0 refills | Status: DC | PRN

## 2020-08-25 NOTE — Addendum Note (Signed)
Addended by: Nadav Swindell, Clarise Cruz E on: 08/25/2020 08:43 AM   Modules accepted: Orders

## 2020-08-25 NOTE — Telephone Encounter (Signed)
Sent medicine in for him this morning.

## 2020-08-25 NOTE — Telephone Encounter (Signed)
Pt aware Rx has been sent to the pharmacy. Pt states that in regards to holding off on the lisinopril that he really cannot tell a difference due to his current cough, but states that he does feel some better.

## 2020-08-30 ENCOUNTER — Ambulatory Visit (INDEPENDENT_AMBULATORY_CARE_PROVIDER_SITE_OTHER): Payer: BC Managed Care – PPO | Admitting: Family Medicine

## 2020-08-30 ENCOUNTER — Other Ambulatory Visit: Payer: Self-pay

## 2020-08-30 ENCOUNTER — Encounter: Payer: Self-pay | Admitting: Family Medicine

## 2020-08-30 VITALS — BP 133/91 | HR 75 | Temp 97.8°F | Ht 73.0 in | Wt 200.0 lb

## 2020-08-30 DIAGNOSIS — I10 Essential (primary) hypertension: Secondary | ICD-10-CM | POA: Diagnosis not present

## 2020-08-30 DIAGNOSIS — J329 Chronic sinusitis, unspecified: Secondary | ICD-10-CM | POA: Diagnosis not present

## 2020-08-30 MED ORDER — LEVOFLOXACIN 500 MG PO TABS: 500.0000 mg | ORAL_TABLET | Freq: Every day | ORAL | 0 refills | Status: DC

## 2020-08-30 NOTE — Patient Instructions (Signed)
Please schedule Ct for later this week.

## 2020-08-30 NOTE — Progress Notes (Signed)
Established Patient Office Visit  Subjective:  Patient ID: Raymond Mitchell, male    DOB: Apr 04, 1956  Age: 64 y.o. MRN: [id number]  CC:  Chief Complaint  Patient presents with  . Hypertension  . Cough    HPI Raymond Mitchell presents for   Hypertension- Pt denies chest pain, SOB, or heart palpitations.   Holding his BP pill bc of low BPS.   Denies medication side effects.   Chronic cough x1 month-has been treated initially with amoxicillin and prednisone.  He actually started feeling significantly better and then suddenly started to get new onset of sneezing and nasal congestion and cough.  This time it was more persistent cough not just at night from postnasal drip.  He did a virtual visit and was started on Augmentin, prednisone,  and cough syrup.  He says this time around it has not helped at all he still feels like he is very congested and having a cough no shortness of breath.  No wheezing.  No chest discomfort or tightness.  He is also noted that he has felt dizzy on and off and when he went to pain management his blood pressure was very low his systolic was less than 403.  He was told to hold his lisinopril for period of time.  He still having some intermittent dizziness that usually brief.  He did do a home COVID test about a week and a half ago which was negative.  Past Medical History:  Diagnosis Date  . Ankylosing spondylitis ([place])   . BPH (benign prostatic hyperplasia)    hx of  . Diverticulosis   . Lymphocytic colitis 01/15/2018  . Pes planus   . Right inguinal hernia   . Tobacco user   . Varicose veins     Past Surgical History:  Procedure Laterality Date  . BACK SURGERY  12/2007   L 4 to L 5 fusion  . BACK SURGERY  2012   disc repair  . c spine ESI     [name]  . INGUINAL HERNIA REPAIR  1994  . INGUINAL HERNIA REPAIR Right 07/16/2020   Procedure: LAPAROSCOPIC RIGHT INGUINAL HERNIA WITH MESH;  Surgeon: [name], [name], MD;  Location: [facility];  Service: General;  Laterality: Right;  . RTC surgery Left 2005    Family History  Problem Relation Age of Onset  . Hyperlipidemia Mother   . Hypertension Mother   . Diabetes Mother   . Hyperlipidemia Father   . Hypertension Father   . Other Father        ank spondy  . Other Brother        Brain tumor, CVA  . Other Brother        ank spondy    Social History   Socioeconomic History  . Marital status: Married    Spouse name: Not on file  . Number of children: Not on file  . Years of education: Not on file  . Highest education level: Not on file  Occupational History  . Not on file  Tobacco Use  . Smoking status: Current Every Day Smoker    Packs/day: 1.00    Years: 40.00    Pack years: 40.00    Last attempt to quit: 12/28/2011    Years since quitting: 8.6  . Smokeless tobacco: Never Used  Vaping Use  . Vaping Use: Never used  Substance and Sexual Activity  . Alcohol use: Yes    Alcohol/week: 21.0  standard drinks    Types: 21 Standard drinks or equivalent per week    Comment: occ wine  . Drug use: Not Currently  . Sexual activity: Not on file  Other Topics Concern  . Not on file  Social History Narrative  . Not on file   Social Determinants of Health   Financial Resource Strain:   . Difficulty of Paying Living Expenses: Not on file  Food Insecurity:   . Worried About Charity fundraiser in the Last Year: Not on file  . Ran Out of Food in the Last Year: Not on file  Transportation Needs:   . Lack of Transportation (Medical): Not on file  . Lack of Transportation (Non-Medical): Not on file  Physical Activity:   . Days of Exercise per Week: Not on file  . Minutes of Exercise per Session: Not on file  Stress:   . Feeling of Stress : Not on file  Social Connections:   . Frequency of Communication with Friends and Family: Not on file  . Frequency of Social Gatherings with Friends and Family: Not on file  . Attends Religious Services: Not on  file  . Active Member of Clubs or Organizations: Not on file  . Attends Archivist Meetings: Not on file  . Marital Status: Not on file  Intimate Partner Violence:   . Fear of Current or Ex-Partner: Not on file  . Emotionally Abused: Not on file  . Physically Abused: Not on file  . Sexually Abused: Not on file    Outpatient Medications Prior to Visit  Medication Sig Dispense Refill  . Apoaequorin (PREVAGEN PO) Take by mouth daily.    . Ascorbic Acid (VITAMIN C WITH ROSE HIPS) 500 MG tablet Take 500 mg by mouth daily.    Marland Kitchen aspirin 81 MG tablet Take 81 mg by mouth daily.     Marland Kitchen atorvastatin (LIPITOR) 40 MG tablet TAKE 1 TABLET BY MOUTH EVERY DAY (Patient taking differently: at bedtime. ) 90 tablet 3  . Calcium Carbonate-Vitamin D (CALTRATE 600+D) 600-400 MG-UNIT per tablet Take 1 tablet by mouth daily.      . celecoxib (CELEBREX) 200 MG capsule TAKE 1-2 CAPSULES (200-400 MG TOTAL) BY MOUTH DAILY. 60 capsule 2  . docusate sodium (STOOL SOFTENER) 100 MG capsule Take 100 mg by mouth 2 (two) times daily.    . Esomeprazole Magnesium (NEXIUM PO) Take by mouth.    . fish oil-omega-3 fatty acids 1000 MG capsule Take by mouth. 1 capsules po daily    . gabapentin (NEURONTIN) 300 MG capsule Take 1 capsule (300 mg total) by mouth 3 (three) times daily. TAKE ONE CAPSULE BY MOUTH 1 TO 3 TIMES DAILY (Patient taking differently: Take 300 mg by mouth 3 (three) times daily. TAKE ONE CAPSULE BY MOUTH 1  In am TO 2 in pm) 270 capsule 3  . [START ON 09/20/2020] HYDROcodone-acetaminophen (NORCO) 10-325 MG tablet Take 1 tablet by mouth 2 (two) times daily as needed.    Marland Kitchen ipratropium (ATROVENT) 0.03 % nasal spray PLACE 2 SPRAYS INTO BOTH NOSTRILS EVERY 12 (TWELVE) HOURS. 30 mL 0  . ketoconazole (NIZORAL) 2 % shampoo DAILY FOR ONE WEEK, THEN 2-3 TIMES A WEEK FOR MAINTENANCE. 240 mL 2  . lisinopril (ZESTRIL) 10 MG tablet TAKE 1 TABLET BY MOUTH EVERY DAY (Patient taking differently: at bedtime. ) 90 tablet 1   . metoprolol succinate (TOPROL-XL) 100 MG 24 hr tablet TAKE 1 TABLET BY MOUTH EVERY DAY (Patient taking  differently: at bedtime. ) 90 tablet 1  . multivitamin (THERAGRAN) per tablet Take 1 tablet by mouth daily.     . Potassium 99 MG TABS Take 2 tablets by mouth daily. 2 tabs in am, 1 tab at hs    . senna-docusate (SENEXON-S) 8.6-50 MG tablet Take 2 tablets by mouth at bedtime. 60 tablet 6  . tamsulosin (FLOMAX) 0.4 MG CAPS capsule TAKE 1 CAPSULE BY MOUTH EVERY DAY (Patient taking differently: at bedtime. ) 90 capsule 1  . Thiamine HCl (VITAMIN B1 PO) Take by mouth.    . Turmeric (QC TUMERIC COMPLEX PO) Take by mouth daily.    . Zinc Sulfate (ZINC 15 PO) Take by mouth daily.    . chlorpheniramine-HYDROcodone (TUSSIONEX) 10-8 MG/5ML SUER Take 5 mLs by mouth every 12 (twelve) hours as needed for cough (cough, will cause drowsiness.). 120 mL 0  . albuterol (VENTOLIN HFA) 108 (90 Base) MCG/ACT inhaler Inhale 2 puffs into the lungs every 6 (six) hours as needed. 6.7 g 0  . amoxicillin-clavulanate (AUGMENTIN) 875-125 MG tablet Take 1 tablet by mouth 2 (two) times daily. 10 tablet 0  . predniSONE (DELTASONE) 50 MG tablet Take 1 tablet (50 mg total) by mouth daily. 5 tablet 0   No facility-administered medications prior to visit.    Allergies  Allergen Reactions  . Nsaids Other (See Comments)    Per gastroenterologist     ROS Review of Systems    Objective:    Physical Exam Constitutional:      Appearance: He is well-developed.  HENT:     Head: Normocephalic and atraumatic.     Right Ear: Tympanic membrane, ear canal and external ear normal.     Left Ear: Tympanic membrane, ear canal and external ear normal.     Nose: Nose normal.     Mouth/Throat:     Mouth: Mucous membranes are moist.     Pharynx: Oropharynx is clear. No posterior oropharyngeal erythema.  Eyes:     Conjunctiva/sclera: Conjunctivae normal.     Pupils: Pupils are equal, round, and reactive to light.  Neck:      Thyroid: No thyromegaly.  Cardiovascular:     Rate and Rhythm: Normal rate.     Heart sounds: Normal heart sounds.  Pulmonary:     Effort: Pulmonary effort is normal.     Breath sounds: Normal breath sounds.  Musculoskeletal:     Cervical back: Neck supple.  Lymphadenopathy:     Cervical: No cervical adenopathy.  Skin:    General: Skin is warm and dry.  Neurological:     Mental Status: He is alert and oriented to person, place, and time.  Psychiatric:        Mood and Affect: Mood normal.     BP (!) 133/91   Pulse 75   Temp 97.8 F (36.6 C) (Oral)   Ht 6\' 1"  (1.854 m)   Wt 200 lb (90.7 kg)   SpO2 100% Comment: on RA  BMI 26.39 kg/m  Wt Readings from Last 3 Encounters:  08/30/20 200 lb (90.7 kg)  08/11/20 187 lb (84.8 kg)  07/16/20 187 lb (84.8 kg)     There are no preventive care reminders to display for this patient.  There are no preventive care reminders to display for this patient.  Lab Results  Component Value Date   TSH 1.52 05/28/2019   Lab Results  Component Value Date   WBC 7.7 11/27/2018   HGB 14.3 07/16/2020  HCT 42.0 07/16/2020   MCV 97.7 11/27/2018   PLT 231 11/27/2018   Lab Results  Component Value Date   NA 142 07/16/2020   K 4.1 07/16/2020   CO2 30 06/02/2020   GLUCOSE 112 (H) 07/16/2020   BUN 18 07/16/2020   CREATININE 1.20 07/16/2020   BILITOT 0.4 12/04/2019   ALKPHOS 55 10/06/2016   AST 19 12/04/2019   ALT 21 12/04/2019   PROT 6.0 (L) 12/04/2019   ALBUMIN 3.9 10/06/2016   CALCIUM 9.2 06/02/2020   Lab Results  Component Value Date   CHOL 131 12/04/2019   Lab Results  Component Value Date   HDL 35 (L) 12/04/2019   Lab Results  Component Value Date   LDLCALC 80 12/04/2019   Lab Results  Component Value Date   TRIG 81 12/04/2019   Lab Results  Component Value Date   CHOLHDL 3.7 12/04/2019   Lab Results  Component Value Date   HGBA1C 6.1 (A) 06/02/2020      Assessment & Plan:   Problem List Items Addressed  This Visit      Cardiovascular and Mediastinum   Essential hypertension, benign    Ischial blood pressure very elevated, repeat pressure much better but not at goal.  Okay to restart lisinopril.  Urged him to monitor blood pressure at home if he is able to monitor for any low pressures.  We can always decrease the dose if needed he says they are too small to actually split.       Other Visit Diagnoses    Recurrent sinusitis    -  Primary   Relevant Medications   levofloxacin (LEVAQUIN) 500 MG tablet   Other Relevant Orders   CT MAXILLOFACIAL WO CONTRAST      Recurrent sinusitis-we will go ahead and switch to fluoroquinolone at this point since he did improve with amoxicillin but then had recurrence of symptoms and was treated with Augmentin and has failed therapy.  Of also get a set him up for CT of the sinuses for later this week if he is not improving.  That way we can go ahead and move forward and get that scheduled.  Meds ordered this encounter  Medications  . levofloxacin (LEVAQUIN) 500 MG tablet    Sig: Take 1 tablet (500 mg total) by mouth daily.    Dispense:  7 tablet    Refill:  0    Follow-up: No follow-ups on file.    Beatrice Lecher, MD

## 2020-08-30 NOTE — Assessment & Plan Note (Addendum)
Ischial blood pressure very elevated, repeat pressure much better but not at goal.  Okay to restart lisinopril.  Urged him to monitor blood pressure at home if he is able to monitor for any low pressures.  We can always decrease the dose if needed he says they are too small to actually split.

## 2020-09-27 ENCOUNTER — Ambulatory Visit: Payer: BC Managed Care – PPO | Admitting: Family Medicine

## 2020-09-27 ENCOUNTER — Encounter: Payer: Self-pay | Admitting: Family Medicine

## 2020-09-27 ENCOUNTER — Other Ambulatory Visit: Payer: Self-pay

## 2020-09-27 VITALS — BP 127/77 | HR 71 | Ht 73.0 in | Wt 201.0 lb

## 2020-09-27 DIAGNOSIS — R0683 Snoring: Secondary | ICD-10-CM | POA: Diagnosis not present

## 2020-09-27 DIAGNOSIS — I10 Essential (primary) hypertension: Secondary | ICD-10-CM

## 2020-09-27 MED ORDER — LISINOPRIL 20 MG PO TABS: 20.0000 mg | ORAL_TABLET | Freq: Every day | ORAL | 1 refills | Status: DC

## 2020-09-27 NOTE — Assessment & Plan Note (Signed)
Pressures have been elevated for at least the last 4 to 5 days.  Though blood pressure looks phenomenal here today.  Encouraged him to consider bringing in his home cuff to compare with ours just to make sure that it is accurate.  We also discussed increasing his lisinopril to 20 mg and doing some additional work-up.  Continue to work on eating low-salt diet which he actually does a pretty good job with.  Evaluate for possible sleep apnea he does report snoring.  Stop bang questionnaire score of 5.  Also consider further evaluation for renal artery stenosis.  Recent creatinine is 1.2.

## 2020-09-27 NOTE — Assessment & Plan Note (Signed)
STOP BANG score of 5/7. Recommend further evaluation. will order home sleep study.

## 2020-09-27 NOTE — Progress Notes (Signed)
Established Patient Office Visit  Subjective:  Patient ID: Raymond Mitchell, male    DOB: November 13, 1955  Age: 64 y.o. MRN: [id number]  CC: No chief complaint on file.   HPI Raymond Mitchell presents for HTN.  He comes in today because his blood pressures have not been well controlled in fact he bought a new cuff about 3 weeks ago.  He said last Friday he came home to get a potluck meal to take over to work and felt dizzy just did not feel well so he checked his blood pressure and it was about 160/100.  He says ever since then except for a short period on Friday it has been elevated anywhere between 140s to 150s.  He has had a little bit of headache on and off.  He denies taking any decongestants over the last several days or NSAIDs.  He does drink some caffeine but not more than about a cup and a half of coffee in the mornings.  He denies any recent chest pain or shortness of breath.  He did have slightly elevated creatinine at 1.2 back in October.  No recent thyroid levels he is currently on lisinopril 10 mg.  He does report that he snores frequently.  He snores less when he sleeps on his side.  Past Medical History:  Diagnosis Date  . Ankylosing spondylitis ([place])   . BPH (benign prostatic hyperplasia)    hx of  . Diverticulosis   . Lymphocytic colitis 01/15/2018  . Pes planus   . Right inguinal hernia   . Tobacco user   . Varicose veins     Past Surgical History:  Procedure Laterality Date  . BACK SURGERY  12/2007   L 4 to L 5 fusion  . BACK SURGERY  2012   disc repair  . c spine ESI     [name]  . INGUINAL HERNIA REPAIR  1994  . INGUINAL HERNIA REPAIR Right 07/16/2020   Procedure: LAPAROSCOPIC RIGHT INGUINAL HERNIA WITH MESH;  Surgeon: [name], [name], MD;  Location: [facility];  Service: General;  Laterality: Right;  . RTC surgery Left 2005    Family History  Problem Relation Age of Onset  . Hyperlipidemia Mother   . Hypertension Mother   .  Diabetes Mother   . Hyperlipidemia Father   . Hypertension Father   . Other Father        ank spondy  . Other Brother        Brain tumor, CVA  . Other Brother        ank spondy    Social History   Socioeconomic History  . Marital status: Married    Spouse name: Not on file  . Number of children: Not on file  . Years of education: Not on file  . Highest education level: Not on file  Occupational History  . Not on file  Tobacco Use  . Smoking status: Current Every Day Smoker    Packs/day: 1.00    Years: 40.00    Pack years: 40.00    Last attempt to quit: 12/28/2011    Years since quitting: 8.7  . Smokeless tobacco: Never Used  Vaping Use  . Vaping Use: Never used  Substance and Sexual Activity  . Alcohol use: Yes    Alcohol/week: 21.0 standard drinks    Types: 21 Standard drinks or equivalent per week    Comment: occ wine  . Drug use: Not Currently  .  Sexual activity: Not on file  Other Topics Concern  . Not on file  Social History Narrative  . Not on file   Social Determinants of Health   Financial Resource Strain: Not on file  Food Insecurity: Not on file  Transportation Needs: Not on file  Physical Activity: Not on file  Stress: Not on file  Social Connections: Not on file  Intimate Partner Violence: Not on file    Outpatient Medications Prior to Visit  Medication Sig Dispense Refill  . Apoaequorin (PREVAGEN PO) Take by mouth daily.    . Ascorbic Acid (VITAMIN C WITH ROSE HIPS) 500 MG tablet Take 500 mg by mouth daily.    Marland Kitchen aspirin 81 MG tablet Take 81 mg by mouth daily.    Marland Kitchen atorvastatin (LIPITOR) 40 MG tablet TAKE 1 TABLET BY MOUTH EVERY DAY (Patient taking differently: at bedtime.) 90 tablet 3  . Calcium Carbonate-Vitamin D 600-400 MG-UNIT tablet Take 1 tablet by mouth daily.    . celecoxib (CELEBREX) 200 MG capsule TAKE 1-2 CAPSULES (200-400 MG TOTAL) BY MOUTH DAILY. 60 capsule 2  . docusate sodium (COLACE) 100 MG capsule Take 100 mg by mouth 2 (two)  times daily.    . Esomeprazole Magnesium (NEXIUM PO) Take by mouth.    . fish oil-omega-3 fatty acids 1000 MG capsule Take by mouth. 1 capsules po daily    . gabapentin (NEURONTIN) 300 MG capsule Take 1 capsule (300 mg total) by mouth 3 (three) times daily. TAKE ONE CAPSULE BY MOUTH 1 TO 3 TIMES DAILY (Patient taking differently: Take 300 mg by mouth 3 (three) times daily. TAKE ONE CAPSULE BY MOUTH 1  In am TO 2 in pm) 270 capsule 3  . HYDROcodone-acetaminophen (NORCO) 10-325 MG tablet Take 1 tablet by mouth 2 (two) times daily as needed.    Marland Kitchen ipratropium (ATROVENT) 0.03 % nasal spray PLACE 2 SPRAYS INTO BOTH NOSTRILS EVERY 12 (TWELVE) HOURS. 30 mL 0  . ketoconazole (NIZORAL) 2 % shampoo DAILY FOR ONE WEEK, THEN 2-3 TIMES A WEEK FOR MAINTENANCE. 240 mL 2  . metoprolol succinate (TOPROL-XL) 100 MG 24 hr tablet TAKE 1 TABLET BY MOUTH EVERY DAY (Patient taking differently: at bedtime.) 90 tablet 1  . multivitamin (THERAGRAN) per tablet Take 1 tablet by mouth daily.    . Potassium 99 MG TABS Take 2 tablets by mouth daily. 2 tabs in am, 1 tab at hs    . senna-docusate (SENEXON-S) 8.6-50 MG tablet Take 2 tablets by mouth at bedtime. 60 tablet 6  . tamsulosin (FLOMAX) 0.4 MG CAPS capsule TAKE 1 CAPSULE BY MOUTH EVERY DAY (Patient taking differently: at bedtime.) 90 capsule 1  . Thiamine HCl (VITAMIN B1 PO) Take by mouth.    . Turmeric (QC TUMERIC COMPLEX PO) Take by mouth daily.    . Zinc Sulfate (ZINC 15 PO) Take by mouth daily.    Marland Kitchen lisinopril (ZESTRIL) 10 MG tablet TAKE 1 TABLET BY MOUTH EVERY DAY (Patient taking differently: at bedtime.) 90 tablet 1  . levofloxacin (LEVAQUIN) 500 MG tablet Take 1 tablet (500 mg total) by mouth daily. 7 tablet 0   No facility-administered medications prior to visit.    Allergies  Allergen Reactions  . Nsaids Other (See Comments)    Per gastroenterologist     ROS Review of Systems    Objective:    Physical Exam Constitutional:      Appearance: He is  well-developed and well-nourished.  HENT:     Head: Normocephalic  and atraumatic.  Cardiovascular:     Rate and Rhythm: Normal rate and regular rhythm.     Heart sounds: Normal heart sounds.  Pulmonary:     Effort: Pulmonary effort is normal.     Breath sounds: Normal breath sounds.  Skin:    General: Skin is warm and dry.  Neurological:     Mental Status: He is alert and oriented to person, place, and time.  Psychiatric:        Mood and Affect: Mood and affect normal.        Behavior: Behavior normal.     BP 127/77   Pulse 71   Ht 6\' 1"  (1.854 m)   Wt 201 lb (91.2 kg)   SpO2 98%   BMI 26.52 kg/m  Wt Readings from Last 3 Encounters:  09/27/20 201 lb (91.2 kg)  08/30/20 200 lb (90.7 kg)  08/11/20 187 lb (84.8 kg)     Health Maintenance Due  Topic Date Due  . COVID-19 Vaccine (3 - Pfizer risk 4-dose series) 02/06/2020    There are no preventive care reminders to display for this patient.  Lab Results  Component Value Date   TSH 1.52 05/28/2019   Lab Results  Component Value Date   WBC 7.7 11/27/2018   HGB 14.3 07/16/2020   HCT 42.0 07/16/2020   MCV 97.7 11/27/2018   PLT 231 11/27/2018   Lab Results  Component Value Date   NA 142 07/16/2020   K 4.1 07/16/2020   CO2 30 06/02/2020   GLUCOSE 112 (H) 07/16/2020   BUN 18 07/16/2020   CREATININE 1.20 07/16/2020   BILITOT 0.4 12/04/2019   ALKPHOS 55 10/06/2016   AST 19 12/04/2019   ALT 21 12/04/2019   PROT 6.0 (L) 12/04/2019   ALBUMIN 3.9 10/06/2016   CALCIUM 9.2 06/02/2020   Lab Results  Component Value Date   CHOL 131 12/04/2019   Lab Results  Component Value Date   HDL 35 (L) 12/04/2019   Lab Results  Component Value Date   LDLCALC 80 12/04/2019   Lab Results  Component Value Date   TRIG 81 12/04/2019   Lab Results  Component Value Date   CHOLHDL 3.7 12/04/2019   Lab Results  Component Value Date   HGBA1C 6.1 (A) 06/02/2020      Assessment & Plan:   Problem List Items Addressed  This Visit      Cardiovascular and Mediastinum   Essential hypertension, benign - Primary    Pressures have been elevated for at least the last 4 to 5 days.  Though blood pressure looks phenomenal here today.  Encouraged him to consider bringing in his home cuff to compare with ours just to make sure that it is accurate.  We also discussed increasing his lisinopril to 20 mg and doing some additional work-up.  Continue to work on eating low-salt diet which he actually does a pretty good job with.  Evaluate for possible sleep apnea he does report snoring.  Stop bang questionnaire score of 5.  Also consider further evaluation for renal artery stenosis.  Recent creatinine is 1.2.      Relevant Medications   lisinopril (ZESTRIL) 20 MG tablet   Other Relevant Orders   CBC   COMPLETE METABOLIC PANEL WITH GFR   TSH   VAS US RENAL ARTERY DUPLEX     Other   Snoring    STOP BANG score of 5/7. Recommend further evaluation. will order home sleep study.  Relevant Orders   VAS US RENAL ARTERY DUPLEX   Home sleep test      Meds ordered this encounter  Medications  . lisinopril (ZESTRIL) 20 MG tablet    Sig: Take 1 tablet (20 mg total) by mouth at bedtime.    Dispense:  90 tablet    Refill:  1    Follow-up: Return in about 2 weeks (around 10/11/2020) for BP check with nurse. bring in hom BP monito0r.    Beatrice Lecher, MD

## 2020-09-28 ENCOUNTER — Telehealth: Payer: Self-pay

## 2020-09-28 NOTE — Telephone Encounter (Signed)
Patient having fasting study tomorrow AM. Left VM asking if he could take morning meds. Left VM advising that he could take morning meds with water.

## 2020-09-29 ENCOUNTER — Other Ambulatory Visit: Payer: Self-pay

## 2020-09-29 ENCOUNTER — Ambulatory Visit (HOSPITAL_COMMUNITY)
Admission: RE | Admit: 2020-09-29 | Discharge: 2020-09-29 | Disposition: A | Discharge status: Home / Self Care | Payer: BC Managed Care – PPO | Source: Ambulatory Visit | Attending: Family Medicine | Admitting: Family Medicine

## 2020-09-29 DIAGNOSIS — R0683 Snoring: Secondary | ICD-10-CM

## 2020-09-29 DIAGNOSIS — I10 Essential (primary) hypertension: Secondary | ICD-10-CM | POA: Insufficient documentation

## 2020-09-30 ENCOUNTER — Telehealth: Payer: Self-pay | Admitting: Family Medicine

## 2020-09-30 LAB — COMPLETE METABOLIC PANEL WITH GFR
AG Ratio: 2.1 (calc) (ref 1.0–2.5)
ALT: 22 U/L (ref 9–46)
AST: 18 U/L (ref 10–35)
Albumin: 4.1 g/dL (ref 3.6–5.1)
Alkaline phosphatase (APISO): 72 U/L (ref 35–144)
BUN/Creatinine Ratio: 26 (calc) — ABNORMAL HIGH (ref 6–22)
BUN: 30 mg/dL — ABNORMAL HIGH (ref 7–25)
CO2: 28 mmol/L (ref 20–32)
Calcium: 9.2 mg/dL (ref 8.6–10.3)
Chloride: 107 mmol/L (ref 98–110)
Creat: 1.15 mg/dL (ref 0.70–1.25)
GFR, Est African American: 78 mL/min/{1.73_m2} (ref >=60)
GFR, Est Non African American: 67 mL/min/{1.73_m2} (ref >=60)
Globulin: 2 g/dL (calc) (ref 1.9–3.7)
Glucose, Bld: 102 mg/dL (ref 65–139)
Potassium: 4.6 mmol/L (ref 3.5–5.3)
Sodium: 143 mmol/L (ref 135–146)
Total Bilirubin: 0.4 mg/dL (ref 0.2–1.2)
Total Protein: 6.1 g/dL (ref 6.1–8.1)

## 2020-09-30 LAB — TSH: TSH: 2.65 mIU/L (ref 0.40–4.50)

## 2020-09-30 LAB — CBC
HCT: 41.5 % (ref 38.5–50.0)
Hemoglobin: 14 g/dL (ref 13.2–17.1)
MCH: 34 pg — ABNORMAL HIGH (ref 27.0–33.0)
MCHC: 33.7 g/dL (ref 32.0–36.0)
MCV: 100.7 fL — ABNORMAL HIGH (ref 80.0–100.0)
MPV: 9.7 fL (ref 7.5–12.5)
Platelets: 237 10*3/uL (ref 140–400)
RBC: 4.12 10*6/uL — ABNORMAL LOW (ref 4.20–5.80)
RDW: 11.9 % (ref 11.0–15.0)
WBC: 8.8 10*3/uL (ref 3.8–10.8)

## 2020-09-30 LAB — VITAMIN B12: Vitamin B-12: 1170 pg/mL — ABNORMAL HIGH (ref 200–1100)

## 2020-09-30 NOTE — Telephone Encounter (Signed)
Patient is returning a phone call to get his lab results. Request that you call him back at [phone]

## 2020-10-04 NOTE — Telephone Encounter (Signed)
Pt advised of results. 

## 2020-10-11 ENCOUNTER — Ambulatory Visit (INDEPENDENT_AMBULATORY_CARE_PROVIDER_SITE_OTHER): Payer: BC Managed Care – PPO | Admitting: Family Medicine

## 2020-10-11 ENCOUNTER — Other Ambulatory Visit: Payer: Self-pay

## 2020-10-11 VITALS — BP 123/75 | HR 67

## 2020-10-11 DIAGNOSIS — I10 Essential (primary) hypertension: Secondary | ICD-10-CM

## 2020-10-11 NOTE — Progress Notes (Signed)
Pressure looks phenomenal but current cuff is off by greater than 10 points.  Hopefully he can return the one that he purchased and try getting a different one.  Beatrice Lecher, MD

## 2020-10-11 NOTE — Progress Notes (Signed)
Established Patient Office Visit  Subjective:  Patient ID: Raymond Mitchell, male    DOB: [birth year]  Age: 65 y.o. MRN: [id number]  CC:  Chief Complaint  Patient presents with  . Hypertension    HPI [name] presents for blood pressure check. He did bring in his home blood pressure monitor. Blood pressure monitor is more than 10 points off. He states he will purchase another monitor. Denies chest pain, shortness of breath or dizziness.   Past Medical History:  Diagnosis Date  . Ankylosing spondylitis ([place])   . BPH (benign prostatic hyperplasia)    hx of  . Diverticulosis   . Lymphocytic colitis 01/15/2018  . Pes planus   . Right inguinal hernia   . Tobacco user   . Varicose veins     Past Surgical History:  Procedure Laterality Date  . BACK SURGERY  12/2007   L 4 to L 5 fusion  . BACK SURGERY  2012   disc repair  . c spine ESI     [name]  . INGUINAL HERNIA REPAIR  1994  . INGUINAL HERNIA REPAIR Right 07/16/2020   Procedure: LAPAROSCOPIC RIGHT INGUINAL HERNIA WITH MESH;  Surgeon: [name], [name], MD;  Location: [facility];  Service: General;  Laterality: Right;  . RTC surgery Left 2005    Family History  Problem Relation Age of Onset  . Hyperlipidemia Mother   . Hypertension Mother   . Diabetes Mother   . Hyperlipidemia Father   . Hypertension Father   . Other Father        ank spondy  . Other Brother        Brain tumor, CVA  . Other Brother        ank spondy    Social History   Socioeconomic History  . Marital status: Married    Spouse name: Not on file  . Number of children: Not on file  . Years of education: Not on file  . Highest education level: Not on file  Occupational History  . Not on file  Tobacco Use  . Smoking status: Current Every Day Smoker    Packs/day: 1.00    Years: 40.00    Pack years: 40.00    Last attempt to quit: 12/28/2011    Years since quitting: 8.7  . Smokeless tobacco: Never Used   Vaping Use  . Vaping Use: Never used  Substance and Sexual Activity  . Alcohol use: Yes    Alcohol/week: 21.0 standard drinks    Types: 21 Standard drinks or equivalent per week    Comment: occ wine  . Drug use: Not Currently  . Sexual activity: Not on file  Other Topics Concern  . Not on file  Social History Narrative  . Not on file   Social Determinants of Health   Financial Resource Strain: Not on file  Food Insecurity: Not on file  Transportation Needs: Not on file  Physical Activity: Not on file  Stress: Not on file  Social Connections: Not on file  Intimate Partner Violence: Not on file    Outpatient Medications Prior to Visit  Medication Sig Dispense Refill  . Apoaequorin (PREVAGEN PO) Take by mouth daily.    . Ascorbic Acid (VITAMIN C WITH ROSE HIPS) 500 MG tablet Take 500 mg by mouth daily.    Marland Kitchen aspirin 81 MG tablet Take 81 mg by mouth daily.    Marland Kitchen atorvastatin (LIPITOR) 40 MG tablet TAKE 1 TABLET  BY MOUTH EVERY DAY (Patient taking differently: at bedtime.) 90 tablet 3  . Calcium Carbonate-Vitamin D 600-400 MG-UNIT tablet Take 1 tablet by mouth daily.    . celecoxib (CELEBREX) 200 MG capsule TAKE 1-2 CAPSULES (200-400 MG TOTAL) BY MOUTH DAILY. 60 capsule 2  . docusate sodium (COLACE) 100 MG capsule Take 100 mg by mouth 2 (two) times daily.    . Esomeprazole Magnesium (NEXIUM PO) Take by mouth.    . fish oil-omega-3 fatty acids 1000 MG capsule Take by mouth. 1 capsules po daily    . gabapentin (NEURONTIN) 300 MG capsule Take 1 capsule (300 mg total) by mouth 3 (three) times daily. TAKE ONE CAPSULE BY MOUTH 1 TO 3 TIMES DAILY (Patient taking differently: Take 300 mg by mouth 3 (three) times daily. TAKE ONE CAPSULE BY MOUTH 1  In am TO 2 in pm) 270 capsule 3  . HYDROcodone-acetaminophen (NORCO) 10-325 MG tablet Take 1 tablet by mouth 2 (two) times daily as needed.    Marland Kitchen ipratropium (ATROVENT) 0.03 % nasal spray PLACE 2 SPRAYS INTO BOTH NOSTRILS EVERY 12 (TWELVE) HOURS. 30  mL 0  . ketoconazole (NIZORAL) 2 % shampoo DAILY FOR ONE WEEK, THEN 2-3 TIMES A WEEK FOR MAINTENANCE. 240 mL 2  . lisinopril (ZESTRIL) 20 MG tablet Take 1 tablet (20 mg total) by mouth at bedtime. 90 tablet 1  . metoprolol succinate (TOPROL-XL) 100 MG 24 hr tablet TAKE 1 TABLET BY MOUTH EVERY DAY (Patient taking differently: at bedtime.) 90 tablet 1  . multivitamin (THERAGRAN) per tablet Take 1 tablet by mouth daily.    . Potassium 99 MG TABS Take 2 tablets by mouth daily. 2 tabs in am, 1 tab at hs    . senna-docusate (SENEXON-S) 8.6-50 MG tablet Take 2 tablets by mouth at bedtime. 60 tablet 6  . tamsulosin (FLOMAX) 0.4 MG CAPS capsule TAKE 1 CAPSULE BY MOUTH EVERY DAY (Patient taking differently: at bedtime.) 90 capsule 1  . Thiamine HCl (VITAMIN B1 PO) Take by mouth.    . Turmeric (QC TUMERIC COMPLEX PO) Take by mouth daily.    . Zinc Sulfate (ZINC 15 PO) Take by mouth daily.     No facility-administered medications prior to visit.    Allergies  Allergen Reactions  . Nsaids Other (See Comments)    Per gastroenterologist     ROS Review of Systems    Objective:    Physical Exam  BP 123/75   Pulse 67   SpO2 99%  Wt Readings from Last 3 Encounters:  09/27/20 201 lb (91.2 kg)  08/30/20 200 lb (90.7 kg)  08/11/20 187 lb (84.8 kg)     Health Maintenance Due  Topic Date Due  . COVID-19 Vaccine (3 - Pfizer risk 4-dose series) 02/06/2020    There are no preventive care reminders to display for this patient.  Lab Results  Component Value Date   TSH 2.65 09/27/2020   Lab Results  Component Value Date   WBC 8.8 09/27/2020   HGB 14.0 09/27/2020   HCT 41.5 09/27/2020   MCV 100.7 (H) 09/27/2020   PLT 237 09/27/2020   Lab Results  Component Value Date   NA 143 09/27/2020   K 4.6 09/27/2020   CO2 28 09/27/2020   GLUCOSE 102 09/27/2020   BUN 30 (H) 09/27/2020   CREATININE 1.15 09/27/2020   BILITOT 0.4 09/27/2020   ALKPHOS 55 10/06/2016   AST 18 09/27/2020   ALT 22  09/27/2020   PROT 6.1 09/27/2020  ALBUMIN 3.9 10/06/2016   CALCIUM 9.2 09/27/2020   Lab Results  Component Value Date   CHOL 131 12/04/2019   Lab Results  Component Value Date   HDL 35 (L) 12/04/2019   Lab Results  Component Value Date   LDLCALC 80 12/04/2019   Lab Results  Component Value Date   TRIG 81 12/04/2019   Lab Results  Component Value Date   CHOLHDL 3.7 12/04/2019   Lab Results  Component Value Date   HGBA1C 6.1 (A) 06/02/2020      Assessment & Plan:  Hypertension - Second blood pressure reading within normal limits. However his blood pressure monitor is not reading accurately. He will pick up a new monitor and follow up in 2 weeks.    Problem List Items Addressed This Visit    Essential hypertension, benign - Primary      No orders of the defined types were placed in this encounter.   Follow-up: Return in about 2 weeks (around 10/25/2020) for nurse visit blood pressure check. Durene Romans, Monico Blitz, [place]

## 2020-10-13 DIAGNOSIS — G894 Chronic pain syndrome: Secondary | ICD-10-CM | POA: Diagnosis not present

## 2020-10-13 DIAGNOSIS — M25561 Pain in right knee: Secondary | ICD-10-CM | POA: Diagnosis not present

## 2020-10-13 DIAGNOSIS — M961 Postlaminectomy syndrome, not elsewhere classified: Secondary | ICD-10-CM | POA: Diagnosis not present

## 2020-10-13 DIAGNOSIS — M1712 Unilateral primary osteoarthritis, left knee: Secondary | ICD-10-CM | POA: Diagnosis not present

## 2020-10-13 DIAGNOSIS — M1711 Unilateral primary osteoarthritis, right knee: Secondary | ICD-10-CM | POA: Diagnosis not present

## 2020-10-14 ENCOUNTER — Other Ambulatory Visit: Payer: Self-pay | Admitting: Family Medicine

## 2020-10-22 ENCOUNTER — Other Ambulatory Visit: Payer: Self-pay | Admitting: Family Medicine

## 2020-10-22 ENCOUNTER — Other Ambulatory Visit: Payer: Self-pay | Admitting: Physician Assistant

## 2020-10-22 DIAGNOSIS — J329 Chronic sinusitis, unspecified: Secondary | ICD-10-CM

## 2020-10-22 DIAGNOSIS — M5416 Radiculopathy, lumbar region: Secondary | ICD-10-CM

## 2020-10-25 ENCOUNTER — Other Ambulatory Visit: Payer: Self-pay

## 2020-10-25 ENCOUNTER — Ambulatory Visit (INDEPENDENT_AMBULATORY_CARE_PROVIDER_SITE_OTHER): Payer: BC Managed Care – PPO | Admitting: Family Medicine

## 2020-10-25 VITALS — BP 124/79 | HR 68 | Temp 98.8°F | Wt 198.1 lb

## 2020-10-25 DIAGNOSIS — I10 Essential (primary) hypertension: Secondary | ICD-10-CM | POA: Diagnosis not present

## 2020-10-25 NOTE — Progress Notes (Signed)
Agree with documentation as above.   Naitik Hermann, MD  

## 2020-10-25 NOTE — Progress Notes (Signed)
Pt is here for a blood pressure check. Denies chest pains, lightheadedness, SOB, dizziness, palpitations, mood or sleep problems. Blood pressure reading is within normal range. Per pt, he has not purchase a new blood pressure machine. Taking current medications as directed. No missed doses. Pt informed to continue blood pressure check at home. Pt informed to follow up with provider as scheduled.

## 2020-10-29 ENCOUNTER — Other Ambulatory Visit: Payer: Self-pay | Admitting: Family Medicine

## 2020-11-05 ENCOUNTER — Other Ambulatory Visit: Payer: Self-pay | Admitting: Family Medicine

## 2020-11-05 DIAGNOSIS — M5412 Radiculopathy, cervical region: Secondary | ICD-10-CM

## 2020-11-20 ENCOUNTER — Other Ambulatory Visit: Payer: Self-pay | Admitting: Family Medicine

## 2020-11-30 ENCOUNTER — Other Ambulatory Visit: Payer: Self-pay

## 2020-11-30 ENCOUNTER — Ambulatory Visit: Payer: BC Managed Care – PPO | Admitting: Family Medicine

## 2020-11-30 ENCOUNTER — Encounter: Payer: Self-pay | Admitting: Family Medicine

## 2020-11-30 VITALS — BP 125/74 | HR 69 | Ht 73.0 in | Wt 198.0 lb

## 2020-11-30 DIAGNOSIS — N400 Enlarged prostate without lower urinary tract symptoms: Secondary | ICD-10-CM

## 2020-11-30 DIAGNOSIS — I10 Essential (primary) hypertension: Secondary | ICD-10-CM

## 2020-11-30 DIAGNOSIS — Z23 Encounter for immunization: Secondary | ICD-10-CM

## 2020-11-30 DIAGNOSIS — E785 Hyperlipidemia, unspecified: Secondary | ICD-10-CM | POA: Diagnosis not present

## 2020-11-30 DIAGNOSIS — R7301 Impaired fasting glucose: Secondary | ICD-10-CM

## 2020-11-30 LAB — POCT GLYCOSYLATED HEMOGLOBIN (HGB A1C): Hemoglobin A1C: 5.6 % (ref 4.0–5.6)

## 2020-11-30 NOTE — Assessment & Plan Note (Signed)
Tolerating statin well.  Due to recheck lipid panel and liver enzymes.

## 2020-11-30 NOTE — Assessment & Plan Note (Signed)
Doing well with current regimen of Flomax.  Due to recheck PSA.

## 2020-11-30 NOTE — Assessment & Plan Note (Signed)
Well controlled. Continue current regimen. Follow up in  6 mo  

## 2020-11-30 NOTE — Progress Notes (Signed)
Established Patient Office Visit  Subjective:  Patient ID: Raymond Mitchell, male    DOB: 06-17-1956  Age: 65 y.o. MRN: [id number]  CC:  Chief Complaint  Patient presents with  . Hypertension    HPI Raymond Mitchell presents for   Hypertension- Pt denies chest pain, SOB, dizziness, or heart palpitations.  Taking meds as directed w/o problems.  Denies medication side effects.  Has been walking for exercise.    Impaired fasting glucose-no increased thirst or urination. No symptoms consistent with hypoglycemia.  Hyperlipidemia - tolerating stating well with no myalgias or significant side effects.  Lab Results  Component Value Date   CHOL 131 12/04/2019   HDL 35 (L) 12/04/2019   LDLCALC 80 12/04/2019   TRIG 81 12/04/2019   CHOLHDL 3.7 12/04/2019    F/U BPH -currently on Flomax daily.  Past Medical History:  Diagnosis Date  . Ankylosing spondylitis ([place])   . BPH (benign prostatic hyperplasia)    hx of  . Diverticulosis   . Lymphocytic colitis 01/15/2018  . Pes planus   . Right inguinal hernia   . Tobacco user   . Varicose veins     Past Surgical History:  Procedure Laterality Date  . BACK SURGERY  12/2007   L 4 to L 5 fusion  . BACK SURGERY  2012   disc repair  . c spine ESI     [name]  . INGUINAL HERNIA REPAIR  1994  . INGUINAL HERNIA REPAIR Right 07/16/2020   Procedure: LAPAROSCOPIC RIGHT INGUINAL HERNIA WITH MESH;  Surgeon: [name], [name], MD;  Location: [facility];  Service: General;  Laterality: Right;  . RTC surgery Left 2005    Family History  Problem Relation Age of Onset  . Hyperlipidemia Mother   . Hypertension Mother   . Diabetes Mother   . Hyperlipidemia Father   . Hypertension Father   . Other Father        ank spondy  . Other Brother        Brain tumor, CVA  . Other Brother        ank spondy    Social History   Socioeconomic History  . Marital status: Married    Spouse name: Not on file  . Number of  children: Not on file  . Years of education: Not on file  . Highest education level: Not on file  Occupational History  . Not on file  Tobacco Use  . Smoking status: Current Every Day Smoker    Packs/day: 1.00    Years: 40.00    Pack years: 40.00    Last attempt to quit: 12/28/2011    Years since quitting: 8.9  . Smokeless tobacco: Never Used  Vaping Use  . Vaping Use: Never used  Substance and Sexual Activity  . Alcohol use: Yes    Alcohol/week: 21.0 standard drinks    Types: 21 Standard drinks or equivalent per week    Comment: occ wine  . Drug use: Not Currently  . Sexual activity: Not on file  Other Topics Concern  . Not on file  Social History Narrative  . Not on file   Social Determinants of Health   Financial Resource Strain: Not on file  Food Insecurity: Not on file  Transportation Needs: Not on file  Physical Activity: Not on file  Stress: Not on file  Social Connections: Not on file  Intimate Partner Violence: Not on file    Outpatient Medications  Prior to Visit  Medication Sig Dispense Refill  . Ascorbic Acid (VITAMIN C WITH ROSE HIPS) 500 MG tablet Take 500 mg by mouth daily.    Marland Kitchen aspirin 81 MG tablet Take 81 mg by mouth daily.    Marland Kitchen atorvastatin (LIPITOR) 40 MG tablet TAKE 1 TABLET BY MOUTH EVERY DAY (Patient taking differently: at bedtime.) 90 tablet 3  . Calcium Carbonate-Vitamin D 600-400 MG-UNIT tablet Take 1 tablet by mouth daily.    . celecoxib (CELEBREX) 200 MG capsule TAKE 1-2 CAPSULES (200-400 MG TOTAL) BY MOUTH DAILY. 60 capsule 2  . fish oil-omega-3 fatty acids 1000 MG capsule Take by mouth. 1 capsules po daily    . gabapentin (NEURONTIN) 300 MG capsule TAKE 1 CAPSULE (300 MG TOTAL) BY MOUTH 3 (THREE) TIMES DAILY. TAKE ONE CAPSULE BY MOUTH 1 TO 3 TIMES DAILY 270 capsule 3  . ipratropium (ATROVENT) 0.03 % nasal spray PLACE 2 SPRAYS INTO BOTH NOSTRILS EVERY 12 (TWELVE) HOURS. 90 mL 1  . ketoconazole (NIZORAL) 2 % shampoo DAILY FOR ONE WEEK, THEN 2-3  TIMES A WEEK FOR MAINTENANCE. 240 mL 2  . lisinopril (ZESTRIL) 20 MG tablet Take 1 tablet (20 mg total) by mouth at bedtime. 90 tablet 1  . metoprolol succinate (TOPROL-XL) 100 MG 24 hr tablet TAKE 1 TABLET BY MOUTH EVERY DAY 90 tablet 1  . multivitamin (THERAGRAN) per tablet Take 1 tablet by mouth daily.    . Potassium 99 MG TABS Take 2 tablets by mouth daily. 2 tabs in am, 1 tab at hs    . senna-docusate (SENEXON-S) 8.6-50 MG tablet Take 2 tablets by mouth at bedtime. 60 tablet 6  . tamsulosin (FLOMAX) 0.4 MG CAPS capsule Take 1 capsule (0.4 mg total) by mouth at bedtime. 90 capsule 1  . Thiamine HCl (VITAMIN B1 PO) Take by mouth.    . Turmeric (QC TUMERIC COMPLEX PO) Take by mouth daily.    . Zinc Sulfate (ZINC 15 PO) Take by mouth daily.    Marland Kitchen albuterol (VENTOLIN HFA) 108 (90 Base) MCG/ACT inhaler TAKE 2 PUFFS BY MOUTH EVERY 6 HOURS AS NEEDED 6.7 each 3  . Apoaequorin (PREVAGEN PO) Take by mouth daily.    Marland Kitchen docusate sodium (COLACE) 100 MG capsule Take 100 mg by mouth 2 (two) times daily.    . Esomeprazole Magnesium (NEXIUM PO) Take by mouth.     No facility-administered medications prior to visit.    Allergies  Allergen Reactions  . Nsaids Other (See Comments)    Per gastroenterologist     ROS Review of Systems    Objective:    Physical Exam Constitutional:      Appearance: He is well-developed and well-nourished.  HENT:     Head: Normocephalic and atraumatic.  Cardiovascular:     Rate and Rhythm: Normal rate and regular rhythm.     Heart sounds: Normal heart sounds.  Pulmonary:     Effort: Pulmonary effort is normal.     Breath sounds: Normal breath sounds.  Skin:    General: Skin is warm and dry.  Neurological:     Mental Status: He is alert and oriented to person, place, and time.  Psychiatric:        Mood and Affect: Mood and affect normal.        Behavior: Behavior normal.     BP 125/74   Pulse 69   Ht 6\' 1"  (1.854 m)   Wt 198 lb (89.8 kg)   SpO2 96%  BMI 26.12 kg/m  Wt Readings from Last 3 Encounters:  11/30/20 198 lb (89.8 kg)  10/25/20 198 lb 1.3 oz (89.8 kg)  09/27/20 201 lb (91.2 kg)     There are no preventive care reminders to display for this patient.  There are no preventive care reminders to display for this patient.  Lab Results  Component Value Date   TSH 2.65 09/27/2020   Lab Results  Component Value Date   WBC 8.8 09/27/2020   HGB 14.0 09/27/2020   HCT 41.5 09/27/2020   MCV 100.7 (H) 09/27/2020   PLT 237 09/27/2020   Lab Results  Component Value Date   NA 143 09/27/2020   K 4.6 09/27/2020   CO2 28 09/27/2020   GLUCOSE 102 09/27/2020   BUN 30 (H) 09/27/2020   CREATININE 1.15 09/27/2020   BILITOT 0.4 09/27/2020   ALKPHOS 55 10/06/2016   AST 18 09/27/2020   ALT 22 09/27/2020   PROT 6.1 09/27/2020   ALBUMIN 3.9 10/06/2016   CALCIUM 9.2 09/27/2020   Lab Results  Component Value Date   CHOL 131 12/04/2019   Lab Results  Component Value Date   HDL 35 (L) 12/04/2019   Lab Results  Component Value Date   LDLCALC 80 12/04/2019   Lab Results  Component Value Date   TRIG 81 12/04/2019   Lab Results  Component Value Date   CHOLHDL 3.7 12/04/2019   Lab Results  Component Value Date   HGBA1C 5.6 11/30/2020      Assessment & Plan:   Problem List Items Addressed This Visit      Cardiovascular and Mediastinum   Essential hypertension, benign - Primary    Well controlled. Continue current regimen. Follow up in  6 mo       Relevant Orders   Lipid Panel w/reflex Direct LDL   COMPLETE METABOLIC PANEL WITH GFR     Endocrine   IFG (impaired fasting glucose)    Great improvement in A1c today.  In fact it is down to 5.6 which is phenomenal.  Follow-up in 6 months.  Continue to work on healthy diet and regular exercise.      Relevant Orders   POCT glycosylated hemoglobin (Hb A1C) (Completed)     Genitourinary   BPH (benign prostatic hyperplasia)    Doing well with current regimen of  Flomax.  Due to recheck PSA.      Relevant Orders   PSA     Other   Hyperlipidemia   Relevant Orders   Lipid Panel w/reflex Direct LDL   COMPLETE METABOLIC PANEL WITH GFR    Other Visit Diagnoses    Need for tetanus, diphtheria, and acellular pertussis (Tdap) vaccine in patient of adolescent age or older       Relevant Orders   Tdap vaccine greater than or equal to 7yo IM (Completed)     Tdap given today.   No orders of the defined types were placed in this encounter.   Follow-up: Return in about 6 months (around 06/02/2021) for Hypertension and A1C.    Beatrice Lecher, MD

## 2020-11-30 NOTE — Assessment & Plan Note (Signed)
Great improvement in A1c today.  In fact it is down to 5.6 which is phenomenal.  Follow-up in 6 months.  Continue to work on healthy diet and regular exercise.

## 2020-12-08 DIAGNOSIS — M25561 Pain in right knee: Secondary | ICD-10-CM | POA: Diagnosis not present

## 2020-12-08 DIAGNOSIS — G894 Chronic pain syndrome: Secondary | ICD-10-CM | POA: Diagnosis not present

## 2020-12-08 DIAGNOSIS — M1712 Unilateral primary osteoarthritis, left knee: Secondary | ICD-10-CM | POA: Diagnosis not present

## 2020-12-08 DIAGNOSIS — M961 Postlaminectomy syndrome, not elsewhere classified: Secondary | ICD-10-CM | POA: Diagnosis not present

## 2020-12-08 DIAGNOSIS — Z5181 Encounter for therapeutic drug level monitoring: Secondary | ICD-10-CM | POA: Diagnosis not present

## 2020-12-08 DIAGNOSIS — Z79899 Other long term (current) drug therapy: Secondary | ICD-10-CM | POA: Diagnosis not present

## 2020-12-10 DIAGNOSIS — E785 Hyperlipidemia, unspecified: Secondary | ICD-10-CM | POA: Diagnosis not present

## 2020-12-10 DIAGNOSIS — I10 Essential (primary) hypertension: Secondary | ICD-10-CM | POA: Diagnosis not present

## 2020-12-10 DIAGNOSIS — N400 Enlarged prostate without lower urinary tract symptoms: Secondary | ICD-10-CM | POA: Diagnosis not present

## 2020-12-11 LAB — COMPLETE METABOLIC PANEL WITH GFR
AG Ratio: 2 (calc) (ref 1.0–2.5)
ALT: 22 U/L (ref 9–46)
AST: 19 U/L (ref 10–35)
Albumin: 4.3 g/dL (ref 3.6–5.1)
Alkaline phosphatase (APISO): 62 U/L (ref 35–144)
BUN/Creatinine Ratio: 17 (calc) (ref 6–22)
BUN: 23 mg/dL (ref 7–25)
CO2: 32 mmol/L (ref 20–32)
Calcium: 9.5 mg/dL (ref 8.6–10.3)
Chloride: 107 mmol/L (ref 98–110)
Creat: 1.33 mg/dL — ABNORMAL HIGH (ref 0.70–1.25)
GFR, Est African American: 65 mL/min/{1.73_m2} (ref >=60)
GFR, Est Non African American: 56 mL/min/{1.73_m2} — ABNORMAL LOW (ref >=60)
Globulin: 2.1 g/dL (calc) (ref 1.9–3.7)
Glucose, Bld: 123 mg/dL — ABNORMAL HIGH (ref 65–99)
Potassium: 4.5 mmol/L (ref 3.5–5.3)
Sodium: 142 mmol/L (ref 135–146)
Total Bilirubin: 0.5 mg/dL (ref 0.2–1.2)
Total Protein: 6.4 g/dL (ref 6.1–8.1)

## 2020-12-11 LAB — PSA: PSA: 0.79 ng/mL (ref <=4.0)

## 2020-12-11 LAB — LIPID PANEL W/REFLEX DIRECT LDL
Cholesterol: 145 mg/dL (ref <200)
HDL: 40 mg/dL (ref >=40)
LDL Cholesterol (Calc): 83 mg/dL (calc)
Non-HDL Cholesterol (Calc): 105 mg/dL (calc) (ref <130)
Total CHOL/HDL Ratio: 3.6 (calc) (ref <5.0)
Triglycerides: 121 mg/dL (ref <150)

## 2020-12-14 ENCOUNTER — Other Ambulatory Visit: Payer: Self-pay | Admitting: *Deleted

## 2020-12-14 DIAGNOSIS — R7989 Other specified abnormal findings of blood chemistry: Secondary | ICD-10-CM

## 2020-12-15 ENCOUNTER — Other Ambulatory Visit: Payer: Self-pay | Admitting: Family Medicine

## 2020-12-15 DIAGNOSIS — I10 Essential (primary) hypertension: Secondary | ICD-10-CM

## 2020-12-20 ENCOUNTER — Other Ambulatory Visit: Payer: Self-pay | Admitting: Family Medicine

## 2020-12-20 DIAGNOSIS — I10 Essential (primary) hypertension: Secondary | ICD-10-CM

## 2021-01-18 ENCOUNTER — Ambulatory Visit (INDEPENDENT_AMBULATORY_CARE_PROVIDER_SITE_OTHER): Payer: BC Managed Care – PPO

## 2021-01-18 ENCOUNTER — Other Ambulatory Visit: Payer: Self-pay

## 2021-01-18 ENCOUNTER — Ambulatory Visit: Payer: BC Managed Care – PPO | Admitting: Sports Medicine

## 2021-01-18 DIAGNOSIS — M75102 Unspecified rotator cuff tear or rupture of left shoulder, not specified as traumatic: Secondary | ICD-10-CM | POA: Insufficient documentation

## 2021-01-18 DIAGNOSIS — M19012 Primary osteoarthritis, left shoulder: Secondary | ICD-10-CM | POA: Diagnosis not present

## 2021-01-18 DIAGNOSIS — S4992XA Unspecified injury of left shoulder and upper arm, initial encounter: Secondary | ICD-10-CM | POA: Diagnosis not present

## 2021-01-18 DIAGNOSIS — S46012A Strain of muscle(s) and tendon(s) of the rotator cuff of left shoulder, initial encounter: Secondary | ICD-10-CM

## 2021-01-18 MED ORDER — CELECOXIB 200 MG PO CAPS: ORAL_CAPSULE | ORAL | 2 refills | Status: DC

## 2021-01-18 NOTE — Progress Notes (Addendum)
    Procedures performed today:    None.  Independent interpretation of notes and tests performed by another provider:   None.  Brief History, Exam, Impression, and Recommendations:    Rotator cuff tear, left This is a pleasant 65 year old male, he went to throw a bag into the trash and felt a pop in his left shoulder, afterwards he had great difficulty abducting and externally rotating. On exam he has only minimally positive impingement signs but significant weakness to abduction and external rotation with a positive drop arm sign and a painful arc all consistent with a supraspinatus and infraspinatus retracted tear. He also has some bicipital signs but good strength. Considering rotator cuff tear and the likely need for surgical repair we are going to proceed with x-rays, MRI, Celebrex for pain. He has a some hydrocodone that he uses chronically.  Update: MRI confirmed full-thickness retracted cuff, referral to Dr. Griffin Basil.     ___________________________________________ Gwen Her. Dianah Field, M.D., ABFM., CAQSM. Primary Care and [place] Instructor of [place] [employer] [facility] [employer]

## 2021-01-18 NOTE — Assessment & Plan Note (Addendum)
This is a pleasant 65 year old male, he went to throw a bag into the trash and felt a pop in his left shoulder, afterwards he had great difficulty abducting and externally rotating. On exam he has only minimally positive impingement signs but significant weakness to abduction and external rotation with a positive drop arm sign and a painful arc all consistent with a supraspinatus and infraspinatus retracted tear. He also has some bicipital signs but good strength. Considering rotator cuff tear and the likely need for surgical repair we are going to proceed with x-rays, MRI, Celebrex for pain. He has a some hydrocodone that he uses chronically.  Update: MRI confirmed full-thickness retracted cuff, referral to Dr. Griffin Basil.

## 2021-01-24 ENCOUNTER — Telehealth: Payer: Self-pay

## 2021-01-24 NOTE — Telephone Encounter (Signed)
Patient called stating that his employer is now wanting a note stating that is able to work and what his limitations are i.e. lifting. Per patient, they are being pretty pushy about getting this today.

## 2021-01-24 NOTE — Telephone Encounter (Signed)
Spoke with patient, took down his email address and will have the front desk scan and email the letter to patient so he can submit to his employer.

## 2021-01-24 NOTE — Telephone Encounter (Signed)
Letter written

## 2021-01-30 ENCOUNTER — Other Ambulatory Visit: Payer: Self-pay

## 2021-01-30 ENCOUNTER — Ambulatory Visit (INDEPENDENT_AMBULATORY_CARE_PROVIDER_SITE_OTHER): Payer: BC Managed Care – PPO

## 2021-01-30 DIAGNOSIS — M2548 Effusion, other site: Secondary | ICD-10-CM | POA: Diagnosis not present

## 2021-01-30 DIAGNOSIS — M75122 Complete rotator cuff tear or rupture of left shoulder, not specified as traumatic: Secondary | ICD-10-CM | POA: Diagnosis not present

## 2021-01-30 DIAGNOSIS — S46012A Strain of muscle(s) and tendon(s) of the rotator cuff of left shoulder, initial encounter: Secondary | ICD-10-CM | POA: Diagnosis not present

## 2021-01-30 DIAGNOSIS — M25412 Effusion, left shoulder: Secondary | ICD-10-CM | POA: Diagnosis not present

## 2021-02-01 NOTE — Addendum Note (Signed)
Addended by: Silverio Decamp on: 02/01/2021 12:09 PM   Modules accepted: Orders

## 2021-02-02 ENCOUNTER — Other Ambulatory Visit: Payer: Self-pay | Admitting: Family Medicine

## 2021-02-02 DIAGNOSIS — I10 Essential (primary) hypertension: Secondary | ICD-10-CM

## 2021-02-08 DIAGNOSIS — G894 Chronic pain syndrome: Secondary | ICD-10-CM | POA: Diagnosis not present

## 2021-02-08 DIAGNOSIS — M1712 Unilateral primary osteoarthritis, left knee: Secondary | ICD-10-CM | POA: Diagnosis not present

## 2021-02-08 DIAGNOSIS — M961 Postlaminectomy syndrome, not elsewhere classified: Secondary | ICD-10-CM | POA: Diagnosis not present

## 2021-02-08 DIAGNOSIS — M25561 Pain in right knee: Secondary | ICD-10-CM | POA: Diagnosis not present

## 2021-02-10 DIAGNOSIS — M25512 Pain in left shoulder: Secondary | ICD-10-CM | POA: Diagnosis not present

## 2021-02-21 DIAGNOSIS — M7522 Bicipital tendinitis, left shoulder: Secondary | ICD-10-CM | POA: Diagnosis not present

## 2021-02-21 DIAGNOSIS — G8918 Other acute postprocedural pain: Secondary | ICD-10-CM | POA: Diagnosis not present

## 2021-02-21 DIAGNOSIS — M7542 Impingement syndrome of left shoulder: Secondary | ICD-10-CM | POA: Diagnosis not present

## 2021-02-21 DIAGNOSIS — M67814 Other specified disorders of tendon, left shoulder: Secondary | ICD-10-CM | POA: Diagnosis not present

## 2021-02-21 DIAGNOSIS — S46012A Strain of muscle(s) and tendon(s) of the rotator cuff of left shoulder, initial encounter: Secondary | ICD-10-CM | POA: Diagnosis not present

## 2021-02-21 DIAGNOSIS — M75112 Incomplete rotator cuff tear or rupture of left shoulder, not specified as traumatic: Secondary | ICD-10-CM | POA: Diagnosis not present

## 2021-02-21 DIAGNOSIS — M7552 Bursitis of left shoulder: Secondary | ICD-10-CM | POA: Diagnosis not present

## 2021-02-21 DIAGNOSIS — Y999 Unspecified external cause status: Secondary | ICD-10-CM | POA: Diagnosis not present

## 2021-02-21 DIAGNOSIS — S43432A Superior glenoid labrum lesion of left shoulder, initial encounter: Secondary | ICD-10-CM | POA: Diagnosis not present

## 2021-02-21 DIAGNOSIS — M24112 Other articular cartilage disorders, left shoulder: Secondary | ICD-10-CM | POA: Diagnosis not present

## 2021-02-21 DIAGNOSIS — X58XXXA Exposure to other specified factors, initial encounter: Secondary | ICD-10-CM | POA: Diagnosis not present

## 2021-03-01 DIAGNOSIS — M24112 Other articular cartilage disorders, left shoulder: Secondary | ICD-10-CM | POA: Diagnosis not present

## 2021-03-29 ENCOUNTER — Ambulatory Visit (INDEPENDENT_AMBULATORY_CARE_PROVIDER_SITE_OTHER): Payer: BC Managed Care – PPO | Admitting: Rehabilitative and Restorative Service Providers"

## 2021-03-29 ENCOUNTER — Encounter: Payer: Self-pay | Admitting: Rehabilitative and Restorative Service Providers"

## 2021-03-29 ENCOUNTER — Other Ambulatory Visit: Payer: Self-pay

## 2021-03-29 DIAGNOSIS — R293 Abnormal posture: Secondary | ICD-10-CM

## 2021-03-29 DIAGNOSIS — Z9889 Other specified postprocedural states: Secondary | ICD-10-CM | POA: Diagnosis not present

## 2021-03-29 DIAGNOSIS — M6281 Muscle weakness (generalized): Secondary | ICD-10-CM

## 2021-03-29 DIAGNOSIS — R29898 Other symptoms and signs involving the musculoskeletal system: Secondary | ICD-10-CM | POA: Diagnosis not present

## 2021-03-29 NOTE — Patient Instructions (Addendum)
Access Code: [email or web address]: [email or web address]: 06/28/2022Prepared by: Aldina Porta HoltExercises  Seated Cervical Retraction - 3 x daily - 7 x weekly - 10 reps - 1 sets  Standing Scapular Retraction - 3 x daily - 7 x weekly - 10 reps - 1 sets - 10 hold  Seated Shoulder Flexion Towel Slide at Table Top Full Range of Motion - 2 x daily - 7 x weekly - 1 sets - 5-10 reps - 10sec hold  Circular Shoulder Pendulum with Table Support - 3-4 x daily - 7 x weekly - 1 sets - 20-30 reps  Sit to Stand - 2 x daily - 7 x weekly - 1 sets - 10 reps - 3-5 sec hold  Wall Quarter Squat - 2 x daily - 7 x weekly - 1-2 sets - 10 reps - 5-10 sec hold

## 2021-03-29 NOTE — Therapy (Signed)
[place], [place], [address] Phone: [phone]   Fax:  [phone]  Physical Therapy Evaluation  Patient Details  Name: Raymond Mitchell MRN: [id number] Date of Birth: 1956-04-15 Referring Provider (PT): Dr Ophelia Charter   Encounter Date: 03/29/2021   PT End of Session - 03/29/21 1113     Visit Number 1    Number of Visits 24    Date for PT Re-Evaluation 06/21/21    PT Start Time 0930    PT Stop Time 1021    PT Time Calculation (min) 51 min    Activity Tolerance Patient tolerated treatment well             Past Medical History:  Diagnosis Date   Ankylosing spondylitis ([place])    BPH (benign prostatic hyperplasia)    hx of   Diverticulosis    Lymphocytic colitis 01/15/2018   Pes planus    Right inguinal hernia    Tobacco user    Varicose veins     Past Surgical History:  Procedure Laterality Date   BACK SURGERY  12/2007   L 4 to L 5 fusion   BACK SURGERY  2012   disc repair   c spine ESI     [name]   INGUINAL HERNIA REPAIR  1994   INGUINAL HERNIA REPAIR Right 07/16/2020   Procedure: LAPAROSCOPIC RIGHT INGUINAL HERNIA WITH MESH;  Surgeon: [name], [name], MD;  Location: [facility];  Service: General;  Laterality: Right;   RTC surgery Left 2005    There were no vitals filed for this visit.    Subjective Assessment - 03/29/21 0936     Subjective Patient reports that he has had pain in the Lt shoulder since March 2022. On the 4/18/22he was throwing trash in a dumpster and felt immediate pain. MRI showed complete tear. Underwent arthroscopic RCR 4/40/10 with no complications. He remains in adduction sling.    Pertinent History Lt shoulder sx 2005; Lumbar surgeries x 2; HNP cervical; HTN    Patient Stated Goals use Lt arm again    Currently in Pain? Yes    Pain Score 0-No pain    Pain Location Shoulder    Pain Orientation Left    Pain Descriptors /  Indicators Aching    Pain Type Surgical pain;Acute pain    Pain Radiating Towards biceps    Pain Onset More than a month ago    Pain Frequency Intermittent    Aggravating Factors  prolonged sitting; certain movements    Pain Relieving Factors moving; changing                [facility] PT Assessment - 03/29/21 0001       Assessment   Medical Diagnosis Lt RCR    Referring Provider (PT) Dr Ophelia Charter    Onset Date/Surgical Date 02/21/21    Hand Dominance Right    Next MD Visit 04/12/21    Prior Therapy for LB and Lt shoulder      Precautions   Precautions Shoulder    Precaution Comments per protocol      Balance Screen   Has the patient fallen in the past 6 months No    Has the patient had a decrease in activity level because of a fear of falling?  No    Is the patient reluctant to leave their home because of a fear of falling?  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; computer work    Leisure walking 3-4 times/wk ~ 1 mile; household chores, cooking; golfing      Observation/Other Assessments   Focus on Therapeutic Outcomes (FOTO)  30      Observation/Other Assessments-Edema    Edema --   mild Lt shd/UE     Sensation   Additional Comments WFL's per pt report      Posture/Postural Control   Posture Comments head forward shoulders rounded and eelvated; head of the humerus anterior in orientation      AROM   Overall AROM Comments Lt shoulder ROM not assessed    Right Shoulder Extension 64 Degrees    Right Shoulder Flexion 152 Degrees    Right Shoulder ABduction 147 Degrees    Right Shoulder Internal Rotation --   thumb T7   Right Shoulder External Rotation 90 Degrees      Strength   Overall Strength Comments Rt UE WFL's for functional tasks; Lt not assessed       Palpation   Spinal mobility hypomobile thoracic spine with PA mobs    Palpation comment muscular tightness Lt shoulder girdle - pecs, upper traps, teres, biceps, anterior deltoid                        Objective measurements completed on examination: See above findings.       [facility] Adult PT Treatment/Exercise - 03/29/21 0001       Self-Care   Self-Care Other Self-Care Comments    Other Self-Care Comments  post op shoulder precautions      Therapeutic Activites    Therapeutic Activities Other Therapeutic Activities    Other Therapeutic Activities myofacial ball release Lt shoulder girdle in standing      Neuro Re-ed    Neuro Re-ed Details  initiated postural correction      Shoulder Exercises: Seated   Other Seated Exercises sit to stand core engaged x 10 slow eccentric stand to sit      Shoulder Exercises: Standing   Other Standing Exercises scap squeeze 10 sec x 10 reps; axial extension 5 sec x 5 with foam roll along spine    Other Standing Exercises wall squat 1/4 squat x 10 reps x 10 sec hold core engaged      Shoulder Exercises: ROM/Strengthening   Pendulum 30CW/CCW      Shoulder Exercises: Stretch   Table Stretch - Flexion 5 reps;10 seconds      Vasopneumatic   Number Minutes Vasopneumatic  15 minutes    Vasopnuematic Location  Shoulder    Vasopneumatic Pressure Low    Vasopneumatic Temperature  34                    PT Education - 03/29/21 1008     Education Details HEP; shoulder precautions    Person(s) Educated Patient    Methods Explanation;Demonstration;Tactile cues;Verbal cues;Handout    Comprehension Verbalized understanding;Returned demonstration;Verbal cues required;Tactile cues required              PT Short Term Goals - 03/29/21 1125       PT SHORT TERM GOAL #1   Title Instruct patient in initial shoulder exercises and postural correction  Time 6    Status New    Target Date 05/10/21      PT SHORT TERM GOAL  #2   Title PROM flexion 140 deg; ER at side 40 deg; abduction 60 deg    Time 6    Period Weeks    Status New    Target Date 05/10/21      PT SHORT TERM GOAL #3   Title Improve posture and alignment with patient to demonstrate increased upright posture with posterior shoulder girdle musculature engaged    Time 6    Period Weeks    Status New    Target Date 05/10/21      PT SHORT TERM GOAL #4   Title Patient to report sleeping in bed instead of recliner    Time 6    Period Weeks    Status New    Target Date 05/10/21               PT Long Term Goals - 03/29/21 1127       PT LONG TERM GOAL #1   Title Increase AROM Lt shoulder to within 5-10 deg of AROM Rt shoulder    Time 12    Period Weeks    Status New    Target Date 06/21/21      PT LONG TERM GOAL #2   Title Increase strength Lt shoulder to 4/5 to 4+/5 throughout    Time 12    Period Weeks    Status New    Target Date 06/21/21      PT LONG TERM GOAL #3   Title Begin functional activities including ADL's to return patient to independent ADL's and return to work    Time 12    Period Weeks    Status New    Target Date 06/21/21      PT LONG TERM GOAL #4   Title Independent in HEP    Time 12    Period Weeks    Status New    Target Date 06/21/21      PT LONG TERM GOAL #5   Title Improve functional limitation score to 63    Time 12    Period Weeks    Status New    Target Date 06/21/21                    Plan - 03/29/21 1009     Clinical Impression Statement Patient presents s/p Lt arthroscopic supraspinatus and infraspinatus repair; biceps tenodesis; extensive debridement; SAD 02/22/21. He continues to be in abduction sling until RTD 04/12/21. Patient experienced initial injury to Lt shoulder 3/22 and re-injury 01/20/21. He has poor posture and alignment; limited ROM/strength/function Lt UE; muscular tightness to palpation; pain on an intermittent basis; limited ADL's and work abilities. Patient  will benefit from PT to address problems identified.    Stability/Clinical Decision Making Stable/Uncomplicated    Clinical Decision Making Low    Rehab Potential Good    PT Frequency 2x / week    PT Duration 12 weeks    PT Treatment/Interventions ADLs/Self Care Home Management;Aquatic Therapy;Cryotherapy;Electrical Stimulation;Iontophoresis 4mg /ml Dexamethasone;Moist Heat;Ultrasound;Therapeutic activities;Therapeutic exercise;Balance training;Neuromuscular re-education;Patient/family education;Manual techniques;Passive range of motion;Dry needling;Taping;Vasopneumatic Device    PT Next Visit Plan review HEP; progress with PROM by PT; review post op precautions; modalities as indicated; progress with shoulder rehab per protocol    PT [place]lick and Agree with Plan of Care Patient  Patient will benefit from skilled therapeutic intervention in order to improve the following deficits and impairments:  Decreased range of motion, Impaired UE functional use, Decreased activity tolerance, Pain, Hypomobility, Impaired flexibility, Improper body mechanics, Other (comment), Decreased mobility, Decreased strength, Increased edema, Postural dysfunction  Visit Diagnosis: Status post left rotator cuff repair  Abnormal posture  Other symptoms and signs involving the musculoskeletal system  Muscle weakness (generalized)     Problem List Patient Active Problem List   Diagnosis Date Noted   Rotator cuff tear, left 01/18/2021   Snoring 09/27/2020   Venous stasis 06/02/2020   IFG (impaired fasting glucose) 05/28/2019   Hiatal hernia 05/28/2019   BPPV (benign paroxysmal positional vertigo), left 11/26/2018   Allergic rhinitis due to pollen 11/26/2018   Lymphocytic colitis 01/15/2018   Right lumbar radiculopathy 09/12/2016   Hepatic cyst 11/11/2014   Cancer of skin, squamous cell 03/11/2014   Seborrheic dermatitis 09/05/2012   SCC (squamous cell  carcinoma), leg 08/29/2011   Left cervical radiculopathy 01/02/2011   POSTURAL LIGHTHEADEDNESS 12/02/2010   CIGARETTE SMOKER 11/29/2010   Fatty liver 10/08/2009   CONSTIPATION, DRUG INDUCED 10/05/2009   DIVERTICULOSIS OF COLON 08/13/2009   [place] DISEASE, LUMBAR 07/08/2007   [place] DISEASE, CERVICAL 03/11/2007   BPH (benign prostatic hyperplasia) 02/13/2007   Hyperlipidemia 01/16/2007   ANKYLOSING SPONDYLITIS 12/05/2006   Essential hypertension, benign 12/05/2006    Princesa Willig Nilda Simmer PT, MPH  03/29/2021, 11:33 AM  [facility] [place] [address] [place], [place], [address] Phone: [phone]   Fax:  [phone]  Name: WAGNER TANZI MRN: [id number] Date of Birth: September 03, 1956

## 2021-03-31 ENCOUNTER — Other Ambulatory Visit: Payer: Self-pay

## 2021-03-31 ENCOUNTER — Encounter: Payer: Self-pay | Admitting: Rehabilitative and Restorative Service Providers"

## 2021-03-31 ENCOUNTER — Ambulatory Visit (INDEPENDENT_AMBULATORY_CARE_PROVIDER_SITE_OTHER): Payer: BC Managed Care – PPO | Admitting: Rehabilitative and Restorative Service Providers"

## 2021-03-31 DIAGNOSIS — M6281 Muscle weakness (generalized): Secondary | ICD-10-CM

## 2021-03-31 DIAGNOSIS — Z9889 Other specified postprocedural states: Secondary | ICD-10-CM | POA: Diagnosis not present

## 2021-03-31 DIAGNOSIS — R29898 Other symptoms and signs involving the musculoskeletal system: Secondary | ICD-10-CM

## 2021-03-31 DIAGNOSIS — R293 Abnormal posture: Secondary | ICD-10-CM

## 2021-03-31 NOTE — Therapy (Signed)
[place], [place], [address] Phone: [phone]   Fax:  [phone]  Physical Therapy Treatment  Patient Details  Name: Raymond Mitchell MRN: [id number] Date of Birth: Aug 06, 1956 Referring Provider (PT): Dr Ophelia Charter   Encounter Date: 03/31/2021   PT End of Session - 03/31/21 0941     Visit Number 2    Number of Visits 24    Date for PT Re-Evaluation 06/21/21    PT Start Time 0934    PT Stop Time 1022    PT Time Calculation (min) 48 min    Activity Tolerance Patient tolerated treatment well             Past Medical History:  Diagnosis Date   Ankylosing spondylitis ([place])    BPH (benign prostatic hyperplasia)    hx of   Diverticulosis    Lymphocytic colitis 01/15/2018   Pes planus    Right inguinal hernia    Tobacco user    Varicose veins     Past Surgical History:  Procedure Laterality Date   BACK SURGERY  12/2007   L 4 to L 5 fusion   BACK SURGERY  2012   disc repair   c spine ESI     [name]   INGUINAL HERNIA REPAIR  1994   INGUINAL HERNIA REPAIR Right 07/16/2020   Procedure: LAPAROSCOPIC RIGHT INGUINAL HERNIA WITH MESH;  Surgeon: [name], [name], MD;  Location: [facility];  Service: General;  Laterality: Right;   RTC surgery Left 2005    There were no vitals filed for this visit.   Subjective Assessment - 03/31/21 0941     Subjective Working on exercises at home. Knees are sore from core exercises. Modified today.    Currently in Pain? No/denies    Pain Score 0-No pain                [facility] PT Assessment - 03/31/21 0001       Assessment   Medical Diagnosis Lt RCR    Referring Provider (PT) Dr Ophelia Charter    Onset Date/Surgical Date 02/21/21    Hand Dominance Right    Next MD Visit 04/12/21    Prior Therapy for LB and Lt shoulder      PROM   Left Shoulder Flexion 137 Degrees    Left Shoulder ABduction 60 Degrees   in scapular  plane   Left Shoulder External Rotation 30 Degrees   in scapular plane                          [facility] Adult PT Treatment/Exercise - 03/31/21 0001       Shoulder Exercises: Standing   Other Standing Exercises scap squeeze 10 sec x 10 reps; axial extension 5 sec x 5 with foam roll along spine    Other Standing Exercises wall squat 1/4 squat x 10 reps x 10 sec hold core engaged      Shoulder Exercises: Stretch   Table Stretch - Flexion 5 reps;10 seconds      Vasopneumatic   Number Minutes Vasopneumatic  15 minutes    Vasopnuematic Location  Shoulder    Vasopneumatic Pressure Low    Vasopneumatic Temperature  34      Manual Therapy   Manual therapy comments pt supine    Soft tissue mobilization pecs; upper trap; leveator; biceps    Scapular Mobilization Lt  Passive ROM Lt shoulder flexion; abduction in scapular plane; ER in scapular plane within MD protocol                      PT Short Term Goals - 03/29/21 1125       PT SHORT TERM GOAL #1   Title Instruct patient in initial shoulder exercises and postural correction    Time 6    Status New    Target Date 05/10/21      PT SHORT TERM GOAL #2   Title PROM flexion 140 deg; ER at side 40 deg; abduction 60 deg    Time 6    Period Weeks    Status New    Target Date 05/10/21      PT SHORT TERM GOAL #3   Title Improve posture and alignment with patient to demonstrate increased upright posture with posterior shoulder girdle musculature engaged    Time 6    Period Weeks    Status New    Target Date 05/10/21      PT SHORT TERM GOAL #4   Title Patient to report sleeping in bed instead of recliner    Time 6    Period Weeks    Status New    Target Date 05/10/21               PT Long Term Goals - 03/29/21 1127       PT LONG TERM GOAL #1   Title Increase AROM Lt shoulder to within 5-10 deg of AROM Rt shoulder    Time 12    Period Weeks    Status New    Target Date 06/21/21      PT  LONG TERM GOAL #2   Title Increase strength Lt shoulder to 4/5 to 4+/5 throughout    Time 12    Period Weeks    Status New    Target Date 06/21/21      PT LONG TERM GOAL #3   Title Begin functional activities including ADL's to return patient to independent ADL's and return to work    Time 12    Period Weeks    Status New    Target Date 06/21/21      PT LONG TERM GOAL #4   Title Independent in HEP    Time 12    Period Weeks    Status New    Target Date 06/21/21      PT LONG TERM GOAL #5   Title Improve functional limitation score to 63    Time 12    Period Weeks    Status New    Target Date 06/21/21                   Plan - 03/31/21 1009     Clinical Impression Statement Patient reports that he has done his exercises at home without difficulty. (some soreness in knee from the core work added at his request - modified today) [place] of exercises. PROM added without difficulty. PROM without MD protocol and pain free.    Rehab Potential Good    PT Frequency 2x / week    PT Duration 12 weeks    PT Treatment/Interventions ADLs/Self Care Home Management;Aquatic Therapy;Cryotherapy;Electrical Stimulation;Iontophoresis 4mg /ml Dexamethasone;Moist Heat;Ultrasound;Therapeutic activities;Therapeutic exercise;Balance training;Neuromuscular re-education;Patient/family education;Manual techniques;Passive range of motion;Dry needling;Taping;Vasopneumatic Device    PT Next Visit Plan review HEP; progress with PROM by PT; review post op precautions; modalities as indicated; progress  with shoulder rehab per protocol    PT Home Exercise Plan [email or web address]    Consulted and Agree with Plan of Care Patient             Patient will benefit from skilled therapeutic intervention in order to improve the following deficits and impairments:     Visit Diagnosis: Status post left rotator cuff repair  Abnormal posture  Other symptoms and signs involving the musculoskeletal  system  Muscle weakness (generalized)     Problem List Patient Active Problem List   Diagnosis Date Noted   Rotator cuff tear, left 01/18/2021   Snoring 09/27/2020   Venous stasis 06/02/2020   IFG (impaired fasting glucose) 05/28/2019   Hiatal hernia 05/28/2019   BPPV (benign paroxysmal positional vertigo), left 11/26/2018   Allergic rhinitis due to pollen 11/26/2018   Lymphocytic colitis 01/15/2018   Right lumbar radiculopathy 09/12/2016   Hepatic cyst 11/11/2014   Cancer of skin, squamous cell 03/11/2014   Seborrheic dermatitis 09/05/2012   SCC (squamous cell carcinoma), leg 08/29/2011   Left cervical radiculopathy 01/02/2011   POSTURAL LIGHTHEADEDNESS 12/02/2010   CIGARETTE SMOKER 11/29/2010   Fatty liver 10/08/2009   CONSTIPATION, DRUG INDUCED 10/05/2009   DIVERTICULOSIS OF COLON 08/13/2009   [place] DISEASE, LUMBAR 07/08/2007   [place] DISEASE, CERVICAL 03/11/2007   BPH (benign prostatic hyperplasia) 02/13/2007   Hyperlipidemia 01/16/2007   ANKYLOSING SPONDYLITIS 12/05/2006   Essential hypertension, benign 12/05/2006    Che Rachal Nilda Simmer PT, MPH  03/31/2021, 10:12 AM  [facility] [place] [address] [place], [place], [address] Phone: [phone]   Fax:  [phone]  Name: Raymond Mitchell MRN: [id number] Date of Birth: 1955-12-01

## 2021-04-01 ENCOUNTER — Other Ambulatory Visit: Payer: Self-pay | Admitting: Family Medicine

## 2021-04-05 ENCOUNTER — Ambulatory Visit: Payer: BC Managed Care – PPO | Admitting: Rehabilitative and Restorative Service Providers"

## 2021-04-12 DIAGNOSIS — M961 Postlaminectomy syndrome, not elsewhere classified: Secondary | ICD-10-CM | POA: Diagnosis not present

## 2021-04-12 DIAGNOSIS — M25561 Pain in right knee: Secondary | ICD-10-CM | POA: Diagnosis not present

## 2021-04-12 DIAGNOSIS — M24112 Other articular cartilage disorders, left shoulder: Secondary | ICD-10-CM | POA: Diagnosis not present

## 2021-04-12 DIAGNOSIS — G894 Chronic pain syndrome: Secondary | ICD-10-CM | POA: Diagnosis not present

## 2021-04-12 DIAGNOSIS — M1712 Unilateral primary osteoarthritis, left knee: Secondary | ICD-10-CM | POA: Diagnosis not present

## 2021-04-13 ENCOUNTER — Ambulatory Visit (INDEPENDENT_AMBULATORY_CARE_PROVIDER_SITE_OTHER): Payer: BC Managed Care – PPO | Admitting: Rehabilitative and Restorative Service Providers"

## 2021-04-13 ENCOUNTER — Encounter: Payer: Self-pay | Admitting: Rehabilitative and Restorative Service Providers"

## 2021-04-13 ENCOUNTER — Other Ambulatory Visit: Payer: Self-pay

## 2021-04-13 DIAGNOSIS — Z9889 Other specified postprocedural states: Secondary | ICD-10-CM

## 2021-04-13 DIAGNOSIS — R293 Abnormal posture: Secondary | ICD-10-CM

## 2021-04-13 DIAGNOSIS — M6281 Muscle weakness (generalized): Secondary | ICD-10-CM | POA: Diagnosis not present

## 2021-04-13 DIAGNOSIS — R29898 Other symptoms and signs involving the musculoskeletal system: Secondary | ICD-10-CM | POA: Diagnosis not present

## 2021-04-13 NOTE — Therapy (Signed)
[place], [place], [address] Phone: [phone]   Fax:  [phone]  Physical Therapy Treatment  Patient Details  Name: Raymond Mitchell MRN: [id number] Date of Birth: Oct 12, 1955 Referring Provider (PT): Dr Ophelia Charter   Encounter Date: 04/13/2021   PT End of Session - 04/13/21 0933     Visit Number 3    Number of Visits 24    Date for PT Re-Evaluation 06/21/21    PT Start Time 0930    PT Stop Time 7001    PT Time Calculation (min) 53 min    Activity Tolerance Patient tolerated treatment well             Past Medical History:  Diagnosis Date   Ankylosing spondylitis ([place])    BPH (benign prostatic hyperplasia)    hx of   Diverticulosis    Lymphocytic colitis 01/15/2018   Pes planus    Right inguinal hernia    Tobacco user    Varicose veins     Past Surgical History:  Procedure Laterality Date   BACK SURGERY  12/2007   L 4 to L 5 fusion   BACK SURGERY  2012   disc repair   c spine ESI     [name]   INGUINAL HERNIA REPAIR  1994   INGUINAL HERNIA REPAIR Right 07/16/2020   Procedure: LAPAROSCOPIC RIGHT INGUINAL HERNIA WITH MESH;  Surgeon: [name], [name], MD;  Location: [facility];  Service: General;  Laterality: Right;   RTC surgery Left 2005    There were no vitals filed for this visit.   Subjective Assessment - 04/13/21 0933     Subjective Saw MD yesterday and Dr Griffin Basil was pleased with where he is. Patient working on exercises at home without difficulty. Rt knee is still sore. Modified the core exercises and has had no problems with sit to stand from the arm of the chair.    Currently in Pain? No/denies    Pain Score 0-No pain    Pain Location Shoulder    Pain Orientation Left                [facility] PT Assessment - 04/13/21 0001       Assessment   Medical Diagnosis Lt RCR    Referring Provider (PT) Dr Ophelia Charter    Onset Date/Surgical  Date 02/21/21    Hand Dominance Right    Next MD Visit 04/12/21    Prior Therapy for LB and Lt shoulder      PROM   Left Shoulder Flexion 141 Degrees    Left Shoulder ABduction 126 Degrees   in scapular plane   Left Shoulder External Rotation 40 Degrees   in scapular plane                          [facility] Adult PT Treatment/Exercise - 04/13/21 0001       Shoulder Exercises: Supine   Flexion AAROM;Left;5 reps    Other Supine Exercises scap sqeeze/chest lift 10 sec hold x 10 reps      Shoulder Exercises: Standing   Other Standing Exercises scap squeeze 10 sec x 10 reps; axial extension 5 sec x 5 with foam roll along spine      Shoulder Exercises: Pulleys   Flexion --   10 sec hold x 10 reps     Shoulder Exercises: ROM/Strengthening   Pendulum 30CW/CCW  Shoulder Exercises: Stretch   Table Stretch - Flexion 5 reps;10 seconds   step back     Vasopneumatic   Number Minutes Vasopneumatic  15 minutes    Vasopnuematic Location  Shoulder    Vasopneumatic Pressure Low    Vasopneumatic Temperature  34      Manual Therapy   Manual therapy comments pt supine    Soft tissue mobilization pecs; upper trap; leveator; biceps    Scapular Mobilization Lt    Passive ROM Lt shoulder flexion; abduction in scapular plane; ER in scapular plane within MD protocol                    PT Education - 04/13/21 0953     Education Details HEP    Person(s) Educated Patient    Methods Explanation;Demonstration;Tactile cues;Verbal cues;Handout    Comprehension Verbalized understanding;Returned demonstration;Verbal cues required;Tactile cues required              PT Short Term Goals - 03/29/21 1125       PT SHORT TERM GOAL #1   Title Instruct patient in initial shoulder exercises and postural correction    Time 6    Status New    Target Date 05/10/21      PT SHORT TERM GOAL #2   Title PROM flexion 140 deg; ER at side 40 deg; abduction 60 deg    Time 6     Period Weeks    Status New    Target Date 05/10/21      PT SHORT TERM GOAL #3   Title Improve posture and alignment with patient to demonstrate increased upright posture with posterior shoulder girdle musculature engaged    Time 6    Period Weeks    Status New    Target Date 05/10/21      PT SHORT TERM GOAL #4   Title Patient to report sleeping in bed instead of recliner    Time 6    Period Weeks    Status New    Target Date 05/10/21               PT Long Term Goals - 03/29/21 1127       PT LONG TERM GOAL #1   Title Increase AROM Lt shoulder to within 5-10 deg of AROM Rt shoulder    Time 12    Period Weeks    Status New    Target Date 06/21/21      PT LONG TERM GOAL #2   Title Increase strength Lt shoulder to 4/5 to 4+/5 throughout    Time 12    Period Weeks    Status New    Target Date 06/21/21      PT LONG TERM GOAL #3   Title Begin functional activities including ADL's to return patient to independent ADL's and return to work    Time 12    Period Weeks    Status New    Target Date 06/21/21      PT LONG TERM GOAL #4   Title Independent in HEP    Time 12    Period Weeks    Status New    Target Date 06/21/21      PT LONG TERM GOAL #5   Title Improve functional limitation score to 63    Time 12    Period Weeks    Status New    Target Date 06/21/21  Plan - 04/13/21 0938     Clinical Impression Statement MD pleased with progress at his appt yesterday. Patient is working on [employer] without difficulty. Added AAROM exercises per MD protocol today.    Rehab Potential Good    PT Frequency 2x / week    PT Duration 12 weeks    PT Treatment/Interventions ADLs/Self Care Home Management;Aquatic Therapy;Cryotherapy;Electrical Stimulation;Iontophoresis 4mg /ml Dexamethasone;Moist Heat;Ultrasound;Therapeutic activities;Therapeutic exercise;Balance training;Neuromuscular re-education;Patient/family education;Manual techniques;Passive range of  motion;Dry needling;Taping;Vasopneumatic Device    PT Next Visit Plan review HEP; progress with AAROM per protocol; modalities as indicated; progress with shoulder rehab per protocol    PT Home Exercise Plan [email or web address]    Consulted and Agree with Plan of Care Patient             Patient will benefit from skilled therapeutic intervention in order to improve the following deficits and impairments:     Visit Diagnosis: Status post left rotator cuff repair  Abnormal posture  Other symptoms and signs involving the musculoskeletal system  Muscle weakness (generalized)     Problem List Patient Active Problem List   Diagnosis Date Noted   Rotator cuff tear, left 01/18/2021   Snoring 09/27/2020   Venous stasis 06/02/2020   IFG (impaired fasting glucose) 05/28/2019   Hiatal hernia 05/28/2019   BPPV (benign paroxysmal positional vertigo), left 11/26/2018   Allergic rhinitis due to pollen 11/26/2018   Lymphocytic colitis 01/15/2018   Right lumbar radiculopathy 09/12/2016   Hepatic cyst 11/11/2014   Cancer of skin, squamous cell 03/11/2014   Seborrheic dermatitis 09/05/2012   SCC (squamous cell carcinoma), leg 08/29/2011   Left cervical radiculopathy 01/02/2011   POSTURAL LIGHTHEADEDNESS 12/02/2010   CIGARETTE SMOKER 11/29/2010   Fatty liver 10/08/2009   CONSTIPATION, DRUG INDUCED 10/05/2009   DIVERTICULOSIS OF COLON 08/13/2009   [place] DISEASE, LUMBAR 07/08/2007   [place] DISEASE, CERVICAL 03/11/2007   BPH (benign prostatic hyperplasia) 02/13/2007   Hyperlipidemia 01/16/2007   ANKYLOSING SPONDYLITIS 12/05/2006   Essential hypertension, benign 12/05/2006    Sakai Heinle Nilda Simmer PT, MPH  04/13/2021, 10:09 AM  [facility] [place], [place], [address] Phone: [phone]   Fax:  [phone]  Name: Raymond Mitchell MRN: [id number] Date of Birth: 1956-06-09

## 2021-04-13 NOTE — Patient Instructions (Signed)
Access Code: [email or web address]: [email or web address]: 07/13/2022Prepared by: Anvita Hirata HoltExercises  Seated Cervical Retraction - 3 x daily - 7 x weekly - 10 reps - 1 sets  Standing Scapular Retraction - 3 x daily - 7 x weekly - 10 reps - 1 sets - 10 hold  Seated Shoulder Flexion Towel Slide at Table Top Full Range of Motion - 2 x daily - 7 x weekly - 1 sets - 5-10 reps - 10sec hold  Circular Shoulder Pendulum with Table Support - 3-4 x daily - 7 x weekly - 1 sets - 20-30 reps  Seated Shoulder External Rotation AAROM with Cane and Hand in Neutral - 2 x daily - 7 x weekly - 1 sets - 5-10 reps - 5-10 sec hold  Standing Shoulder and Trunk Flexion at Table - 2 x daily - 7 x weekly - 1 sets - 5 reps - 5 sec hold  Seated Shoulder Flexion AAROM with Pulley Behind - 2 x daily - 7 x weekly - 1 sets - 10 reps - 10 sec hold  Supine Shoulder Flexion AAROM with Hands Clasped - 2 x daily - 7 x weekly - 1 sets - 5-10 reps - 2-3 sec hold

## 2021-04-15 ENCOUNTER — Ambulatory Visit (INDEPENDENT_AMBULATORY_CARE_PROVIDER_SITE_OTHER): Payer: BC Managed Care – PPO | Admitting: Rehabilitative and Restorative Service Providers"

## 2021-04-15 ENCOUNTER — Encounter: Payer: Self-pay | Admitting: Rehabilitative and Restorative Service Providers"

## 2021-04-15 ENCOUNTER — Other Ambulatory Visit: Payer: Self-pay

## 2021-04-15 DIAGNOSIS — R293 Abnormal posture: Secondary | ICD-10-CM | POA: Diagnosis not present

## 2021-04-15 DIAGNOSIS — M6281 Muscle weakness (generalized): Secondary | ICD-10-CM | POA: Diagnosis not present

## 2021-04-15 DIAGNOSIS — Z9889 Other specified postprocedural states: Secondary | ICD-10-CM

## 2021-04-15 DIAGNOSIS — R29898 Other symptoms and signs involving the musculoskeletal system: Secondary | ICD-10-CM

## 2021-04-15 NOTE — Patient Instructions (Signed)
Access Code: [email or web address]: [email or web address]: 07/15/2022Prepared by: Gresham Caetano HoltExercises  Seated Cervical Retraction - 3 x daily - 7 x weekly - 10 reps - 1 sets  Standing Scapular Retraction - 3 x daily - 7 x weekly - 10 reps - 1 sets - 10 hold  Seated Shoulder Flexion Towel Slide at Table Top Full Range of Motion - 2 x daily - 7 x weekly - 1 sets - 5-10 reps - 10sec hold  Circular Shoulder Pendulum with Table Support - 3-4 x daily - 7 x weekly - 1 sets - 20-30 reps  Seated Shoulder External Rotation AAROM with Cane and Hand in Neutral - 2 x daily - 7 x weekly - 1 sets - 5-10 reps - 5-10 sec hold  Standing Shoulder and Trunk Flexion at Table - 2 x daily - 7 x weekly - 1 sets - 5 reps - 5 sec hold  Seated Shoulder Flexion AAROM with Pulley Behind - 2 x daily - 7 x weekly - 1 sets - 10 reps - 10 sec hold  Supine Shoulder Flexion AAROM with Hands Clasped - 2 x daily - 7 x weekly - 1 sets - 5-10 reps - 2-3 sec hold  Standing Isometric Shoulder Extension with Doorway - Arm Bent - 2 x daily - 7 x weekly - 1 sets - 5-10 reps - 5 sec hold  Standing Isometric Shoulder External Rotation with Doorway - 2 x daily - 7 x weekly - 1 sets - 5-10 reps - 5 sec hold  Standing Isometric Shoulder Internal Rotation with Towel Roll at Doorway - 2 x daily - 7 x weekly - 1 sets - 5 reps - 5 sec hold  Standing Isometric Shoulder Internal Rotation with Towel Roll at Doorway - 2 x daily - 7 x weekly - 1 sets - 5 reps - 5 sec hold  Standing Isometric Shoulder Flexion with Doorway - Arm Bent - 2 x daily - 7 x weekly - 1 sets - 5 reps - 5 sec hold

## 2021-04-15 NOTE — Therapy (Signed)
[name], [place], [address] Phone: [phone]   Fax:  [phone]  Physical Therapy Treatment  Patient Details  Name: Raymond Mitchell MRN: [id number] Date of Birth: [birth year] Referring Provider (PT): Dr Ophelia Charter   Encounter Date: 04/15/2021   PT End of Session - 04/15/21 0928     Visit Number 4    Number of Visits 24    Date for PT Re-Evaluation 06/21/21    PT Start Time 0927    PT Stop Time 1020    PT Time Calculation (min) 53 min    Activity Tolerance Patient tolerated treatment well             Past Medical History:  Diagnosis Date   Ankylosing spondylitis ([place])    BPH (benign prostatic hyperplasia)    hx of   Diverticulosis    Lymphocytic colitis 01/15/2018   Pes planus    Right inguinal hernia    Tobacco user    Varicose veins     Past Surgical History:  Procedure Laterality Date   BACK SURGERY  12/2007   L 4 to L 5 fusion   BACK SURGERY  2012   disc repair   c spine ESI     [name]   INGUINAL HERNIA REPAIR  1994   INGUINAL HERNIA REPAIR Right 07/16/2020   Procedure: LAPAROSCOPIC RIGHT INGUINAL HERNIA WITH MESH;  Surgeon: [name], [name], MD;  Location: [facility];  Service: General;  Laterality: Right;   RTC surgery Left 2005    There were no vitals filed for this visit.   Subjective Assessment - 04/15/21 0929     Subjective Doing well - rigged up a pulley at home that works pretty well.    Currently in Pain? No/denies    Pain Score 0-No pain    Pain Location Shoulder    Pain Orientation Left                               [facility] Adult PT Treatment/Exercise - 04/15/21 0001       Shoulder Exercises: Supine   Flexion AAROM;Left;5 reps    Other Supine Exercises scap sqeeze/chest lift 10 sec hold x 10 reps      Shoulder Exercises: Standing   Other Standing Exercises scap squeeze 10 sec x 10 reps; axial  extension 5 sec x 5 with foam roll along spine      Shoulder Exercises: Pulleys   Flexion --   10 sec hold x 10 reps   Scaption --   10 reps x 10 sec hold no pain     Shoulder Exercises: ROM/Strengthening   Pendulum 30CW/CCW      Shoulder Exercises: Isometric Strengthening   Flexion 5X5"    Extension 5X5"    External Rotation 5X5"    Internal Rotation 5X5"    ABduction 5X5"      Vasopneumatic   Number Minutes Vasopneumatic  15 minutes    Vasopnuematic Location  Shoulder    Vasopneumatic Pressure Low    Vasopneumatic Temperature  34      Manual Therapy   Manual therapy comments pt supine    Soft tissue mobilization pecs; upper trap; leveator; biceps    Scapular Mobilization Lt    Passive ROM Lt shoulder flexion; abduction in scapular plane; ER in scapular plane within MD protocol  PT Education - 04/15/21 0943     Education Details HEP    Person(s) Educated Patient    Methods Explanation;Demonstration;Tactile cues;Verbal cues;Handout    Comprehension Verbalized understanding;Returned demonstration;Verbal cues required;Tactile cues required              PT Short Term Goals - 03/29/21 1125       PT SHORT TERM GOAL #1   Title Instruct patient in initial shoulder exercises and postural correction    Time 6    Status New    Target Date 05/10/21      PT SHORT TERM GOAL #2   Title PROM flexion 140 deg; ER at side 40 deg; abduction 60 deg    Time 6    Period Weeks    Status New    Target Date 05/10/21      PT SHORT TERM GOAL #3   Title Improve posture and alignment with patient to demonstrate increased upright posture with posterior shoulder girdle musculature engaged    Time 6    Period Weeks    Status New    Target Date 05/10/21      PT SHORT TERM GOAL #4   Title Patient to report sleeping in bed instead of recliner    Time 6    Period Weeks    Status New    Target Date 05/10/21               PT Long Term Goals -  03/29/21 1127       PT LONG TERM GOAL #1   Title Increase AROM Lt shoulder to within 5-10 deg of AROM Rt shoulder    Time 12    Period Weeks    Status New    Target Date 06/21/21      PT LONG TERM GOAL #2   Title Increase strength Lt shoulder to 4/5 to 4+/5 throughout    Time 12    Period Weeks    Status New    Target Date 06/21/21      PT LONG TERM GOAL #3   Title Begin functional activities including ADL's to return patient to independent ADL's and return to work    Time 12    Period Weeks    Status New    Target Date 06/21/21      PT LONG TERM GOAL #4   Title Independent in HEP    Time 12    Period Weeks    Status New    Target Date 06/21/21      PT LONG TERM GOAL #5   Title Improve functional limitation score to 63    Time 12    Period Weeks    Status New    Target Date 06/21/21                   Plan - 04/15/21 0929     Clinical Impression Statement Patient continues to progress with shoulder rehab. Added isometric exercises for HEP. Eight weeks post op Tuesday. Progressing well with rehab per protocol.    Rehab Potential Good    PT Frequency 2x / week    PT Duration 12 weeks    PT Treatment/Interventions ADLs/Self Care Home Management;Aquatic Therapy;Cryotherapy;Electrical Stimulation;Iontophoresis 4mg /ml Dexamethasone;Moist Heat;Ultrasound;Therapeutic activities;Therapeutic exercise;Balance training;Neuromuscular re-education;Patient/family education;Manual techniques;Passive range of motion;Dry needling;Taping;Vasopneumatic Device    PT Next Visit Plan review HEP; progress with AAROM per protocol; modalities as indicated; progress with shoulder rehab per protocol    PT Home Exercise  Plan [email or web address]    Consulted and Agree with Plan of Care Patient             Patient will benefit from skilled therapeutic intervention in order to improve the following deficits and impairments:     Visit Diagnosis: Status post left rotator cuff  repair  Abnormal posture  Other symptoms and signs involving the musculoskeletal system  Muscle weakness (generalized)     Problem List Patient Active Problem List   Diagnosis Date Noted   Rotator cuff tear, left 01/18/2021   Snoring 09/27/2020   Venous stasis 06/02/2020   IFG (impaired fasting glucose) 05/28/2019   Hiatal hernia 05/28/2019   BPPV (benign paroxysmal positional vertigo), left 11/26/2018   Allergic rhinitis due to pollen 11/26/2018   Lymphocytic colitis 01/15/2018   Right lumbar radiculopathy 09/12/2016   Hepatic cyst 11/11/2014   Cancer of skin, squamous cell 03/11/2014   Seborrheic dermatitis 09/05/2012   SCC (squamous cell carcinoma), leg 08/29/2011   Left cervical radiculopathy 01/02/2011   POSTURAL LIGHTHEADEDNESS 12/02/2010   CIGARETTE SMOKER 11/29/2010   Fatty liver 10/08/2009   CONSTIPATION, DRUG INDUCED 10/05/2009   DIVERTICULOSIS OF COLON 08/13/2009   [place] DISEASE, LUMBAR 07/08/2007   [place] DISEASE, CERVICAL 03/11/2007   BPH (benign prostatic hyperplasia) 02/13/2007   Hyperlipidemia 01/16/2007   ANKYLOSING SPONDYLITIS 12/05/2006   Essential hypertension, benign 12/05/2006    Rudene Poulsen Nilda Simmer PT, MPH  04/15/2021, 10:09 AM  [facility] [place] [address] [place], [place], [address] Phone: [phone]   Fax:  [phone]  Name: MACY LINGENFELTER MRN: [id number] Date of Birth: [birth year]

## 2021-04-20 ENCOUNTER — Ambulatory Visit (INDEPENDENT_AMBULATORY_CARE_PROVIDER_SITE_OTHER): Payer: BC Managed Care – PPO | Admitting: Physical Therapy

## 2021-04-20 ENCOUNTER — Other Ambulatory Visit: Payer: Self-pay

## 2021-04-20 DIAGNOSIS — Z9889 Other specified postprocedural states: Secondary | ICD-10-CM | POA: Diagnosis not present

## 2021-04-20 DIAGNOSIS — R293 Abnormal posture: Secondary | ICD-10-CM | POA: Diagnosis not present

## 2021-04-20 DIAGNOSIS — R29898 Other symptoms and signs involving the musculoskeletal system: Secondary | ICD-10-CM

## 2021-04-20 NOTE — Patient Instructions (Signed)
Access Code: [email or web address] URL: [email or web address] Date: 04/20/2021 Prepared by: [facility]  Exercises Seated Cervical Retraction - 3 x daily - 7 x weekly - 10 reps - 1 sets Standing Scapular Retraction - 3 x daily - 7 x weekly - 10 reps - 1 sets - 10 hold Seated Shoulder Flexion Towel Slide at Table Top Full Range of Motion - 2 x daily - 7 x weekly - 1 sets - 5-10 reps - 10sec hold Circular Shoulder Pendulum with Table Support - 3-4 x daily - 7 x weekly - 1 sets - 20-30 reps Seated Shoulder External Rotation AAROM with Cane and Hand in Neutral - 2 x daily - 7 x weekly - 1 sets - 5-10 reps - 5-10 sec hold Standing Shoulder and Trunk Flexion at Table - 2 x daily - 7 x weekly - 1 sets - 5 reps - 5 sec hold Seated Shoulder Flexion AAROM with Pulley Behind - 2 x daily - 7 x weekly - 1 sets - 10 reps - 10 sec hold Standing Isometric Shoulder Extension with Doorway - Arm Bent - 2 x daily - 7 x weekly - 1 sets - 5-10 reps - 5 sec hold Standing Isometric Shoulder External Rotation with Doorway - 2 x daily - 7 x weekly - 1 sets - 5-10 reps - 5 sec hold Standing Isometric Shoulder Internal Rotation with Towel Roll at Doorway - 2 x daily - 7 x weekly - 1 sets - 5 reps - 5 sec hold Standing Isometric Shoulder Internal Rotation with Towel Roll at Doorway - 2 x daily - 7 x weekly - 1 sets - 5 reps - 5 sec hold Standing Isometric Shoulder Flexion with Doorway - Arm Bent - 2 x daily - 7 x weekly - 1 sets - 5 reps - 5 sec hold Supine Shoulder Flexion Extension AAROM with Dowel - 2 x daily - 7 x weekly - 1 sets - 5-10 reps - 3 sec hold Supine Shoulder External Rotation in 45 Degrees Abduction AAROM with Dowel - 2 x daily - 7 x weekly - 1 sets - 5-10 reps - 3-10 sec hold Standing Shoulder Extension with Dowel - 2 x daily - 7 x weekly - 1 sets - 5-10 reps - 3 seconds hold Standing Bilateral Shoulder Internal Rotation AAROM with Dowel - 2 x daily - 7 x weekly - 1 sets - 5-10 reps  - 3 seconds hold

## 2021-04-20 NOTE — Therapy (Signed)
[place], [place], [address] Phone: [phone]   Fax:  [phone]  Physical Therapy Treatment  Patient Details  Name: Raymond Mitchell MRN: [id number] Date of Birth: 1956/07/25 Referring Provider (PT): Dr Ophelia Charter   Encounter Date: 04/20/2021   PT End of Session - 04/20/21 1148     Visit Number 5    Number of Visits 24    Date for PT Re-Evaluation 06/21/21    PT Start Time 1148    PT Stop Time 1231    PT Time Calculation (min) 43 min    Activity Tolerance Patient tolerated treatment well    Behavior During Therapy [facility] for tasks assessed/performed             Past Medical History:  Diagnosis Date   Ankylosing spondylitis ([place])    BPH (benign prostatic hyperplasia)    hx of   Diverticulosis    Lymphocytic colitis 01/15/2018   Pes planus    Right inguinal hernia    Tobacco user    Varicose veins     Past Surgical History:  Procedure Laterality Date   BACK SURGERY  12/2007   L 4 to L 5 fusion   BACK SURGERY  2012   disc repair   c spine ESI     [name]   INGUINAL HERNIA REPAIR  1994   INGUINAL HERNIA REPAIR Right 07/16/2020   Procedure: LAPAROSCOPIC RIGHT INGUINAL HERNIA WITH MESH;  Surgeon: [name], [name], MD;  Location: [facility];  Service: General;  Laterality: Right;   RTC surgery Left 2005    There were no vitals filed for this visit.   Subjective Assessment - 04/20/21 1154     Subjective Pt reports he has some discomfort with pulleys and supine flexion (upon lowering), but not bad.    Pertinent History Lt shoulder sx 2005; Lumbar surgeries x 2; HNP cervical; HTN    Patient Stated Goals use Lt arm again    Currently in Pain? Yes    Pain Score 2     Pain Location Shoulder    Pain Orientation Left    Pain Descriptors / Indicators Simonne Martinet PT Assessment - 04/20/21 0001       Assessment   Medical Diagnosis Lt  RCR    Referring Provider (PT) Dr Ophelia Charter    Onset Date/Surgical Date 02/21/21    Hand Dominance Right    Next MD Visit 05/24/21    Prior Therapy for LB and Lt shoulder      PROM   Left Shoulder External Rotation 53 Degrees   supported scapular plane, supine with Cherlynn June Adult PT Treatment/Exercise - 04/20/21 0001       Self-Care   Other Self-Care Comments  pt instructed in self massage with ball (against wall) to Lt pec and periscapular musculature. Pt returned demo with cues.      Shoulder Exercises: Supine   Horizontal ABduction AAROM;Both;10 reps   and horiz add - with cane   External Rotation AAROM;Left;10 reps   support scaption   Flexion AAROM;Both;10 reps   with cane   ABduction AAROM;Left;10 reps   with cane   ABduction Limitations (diagonal to L- to tissue limits and no pain)      Shoulder Exercises: Standing  Internal Rotation AAROM;Both;10 reps   cane behind back   Extension AAROM;Both;10 reps   cane   Other Standing Exercises reviewed scap squeeze and chin tuck from HEP.      Shoulder Exercises: Isometric Strengthening   Flexion 5X5"    Extension 5X5"    External Rotation 5X5"    Internal Rotation 5X5"    ABduction 5X5"      Vasopneumatic   Number Minutes Vasopneumatic  10 minutes    Vasopnuematic Location  Shoulder   Lt   Vasopneumatic Pressure Low    Vasopneumatic Temperature  34 degrees                      PT Short Term Goals - 04/20/21 1237       PT SHORT TERM GOAL #1   Title Instruct patient in initial shoulder exercises and postural correction    Time 6    Status Achieved    Target Date 05/10/21      PT SHORT TERM GOAL #2   Title PROM flexion 140 deg; ER at side 40 deg; abduction 60 deg    Time 6    Period Weeks    Status Achieved    Target Date 05/10/21      PT SHORT TERM GOAL #3   Title Improve posture and alignment with patient to demonstrate increased upright posture with posterior shoulder  girdle musculature engaged    Time 6    Period Weeks    Status Achieved    Target Date 05/10/21      PT SHORT TERM GOAL #4   Title Patient to report sleeping in bed instead of recliner    Time 6    Period Weeks    Status On-going    Target Date 05/10/21               PT Long Term Goals - 03/29/21 1127       PT LONG TERM GOAL #1   Title Increase AROM Lt shoulder to within 5-10 deg of AROM Rt shoulder    Time 12    Period Weeks    Status New    Target Date 06/21/21      PT LONG TERM GOAL #2   Title Increase strength Lt shoulder to 4/5 to 4+/5 throughout    Time 12    Period Weeks    Status New    Target Date 06/21/21      PT LONG TERM GOAL #3   Title Begin functional activities including ADL's to return patient to independent ADL's and return to work    Time 12    Period Weeks    Status New    Target Date 06/21/21      PT LONG TERM GOAL #4   Title Independent in HEP    Time 12    Period Weeks    Status New    Target Date 06/21/21      PT LONG TERM GOAL #5   Title Improve functional limitation score to 63    Time 12    Period Weeks    Status New    Target Date 06/21/21                   Plan - 04/20/21 1235     Clinical Impression Statement Trialed additional AAROM exercises with cane for Lt shoulder ROM (within protocol), with good tolerance.  Added these to HEP.  Pt demonstrates good  form with minimal cues with exercises.  Pt reported reduction of discomfort after completion of ROM exercises.  Progressing well towards goals.    Rehab Potential Good    PT Frequency 2x / week    PT Duration 12 weeks    PT Treatment/Interventions ADLs/Self Care Home Management;Aquatic Therapy;Cryotherapy;Electrical Stimulation;Iontophoresis 4mg /ml Dexamethasone;Moist Heat;Ultrasound;Therapeutic activities;Therapeutic exercise;Balance training;Neuromuscular re-education;Patient/family education;Manual techniques;Passive range of motion;Dry  needling;Taping;Vasopneumatic Device    PT Next Visit Plan progress with AAROM per protocol; modalities as indicated; progress with shoulder rehab per protocol. Inquire if pt is back to sleeping in bed (STG#4)    PT Home Exercise Plan [email or web address]    Consulted and Agree with Plan of Care Patient             Patient will benefit from skilled therapeutic intervention in order to improve the following deficits and impairments:  Decreased range of motion, Impaired UE functional use, Decreased activity tolerance, Pain, Hypomobility, Impaired flexibility, Improper body mechanics, Other (comment), Decreased mobility, Decreased strength, Increased edema, Postural dysfunction  Visit Diagnosis: Status post left rotator cuff repair  Abnormal posture  Other symptoms and signs involving the musculoskeletal system     Problem List Patient Active Problem List   Diagnosis Date Noted   Rotator cuff tear, left 01/18/2021   Snoring 09/27/2020   Venous stasis 06/02/2020   IFG (impaired fasting glucose) 05/28/2019   Hiatal hernia 05/28/2019   BPPV (benign paroxysmal positional vertigo), left 11/26/2018   Allergic rhinitis due to pollen 11/26/2018   Lymphocytic colitis 01/15/2018   Right lumbar radiculopathy 09/12/2016   Hepatic cyst 11/11/2014   Cancer of skin, squamous cell 03/11/2014   Seborrheic dermatitis 09/05/2012   SCC (squamous cell carcinoma), leg 08/29/2011   Left cervical radiculopathy 01/02/2011   POSTURAL LIGHTHEADEDNESS 12/02/2010   CIGARETTE SMOKER 11/29/2010   Fatty liver 10/08/2009   CONSTIPATION, DRUG INDUCED 10/05/2009   DIVERTICULOSIS OF COLON 08/13/2009   [place] DISEASE, LUMBAR 07/08/2007   [place] DISEASE, CERVICAL 03/11/2007   BPH (benign prostatic hyperplasia) 02/13/2007   Hyperlipidemia 01/16/2007   ANKYLOSING SPONDYLITIS 12/05/2006   Essential hypertension, benign 12/05/2006   Kerin Perna, PTA 04/20/21 12:43 PM  [place] [address] [place], [place], [address] Phone: [phone]   Fax:  [phone]  Name: Raymond Mitchell MRN: [id number] Date of Birth: [birth year]

## 2021-04-22 ENCOUNTER — Ambulatory Visit (INDEPENDENT_AMBULATORY_CARE_PROVIDER_SITE_OTHER): Payer: BC Managed Care – PPO | Admitting: Physical Therapy

## 2021-04-22 ENCOUNTER — Other Ambulatory Visit: Payer: Self-pay

## 2021-04-22 DIAGNOSIS — Z9889 Other specified postprocedural states: Secondary | ICD-10-CM | POA: Diagnosis not present

## 2021-04-22 DIAGNOSIS — R29898 Other symptoms and signs involving the musculoskeletal system: Secondary | ICD-10-CM | POA: Diagnosis not present

## 2021-04-22 DIAGNOSIS — M6281 Muscle weakness (generalized): Secondary | ICD-10-CM | POA: Diagnosis not present

## 2021-04-22 DIAGNOSIS — R293 Abnormal posture: Secondary | ICD-10-CM

## 2021-04-22 NOTE — Therapy (Signed)
[place], [place], [address] Phone: [phone]   Fax:  [phone]  Physical Therapy Treatment  Patient Details  Name: Raymond Mitchell MRN: [id number] Date of Birth: June 15, 1956 Referring Provider (PT): Dr Ophelia Charter   Encounter Date: 04/22/2021   PT End of Session - 04/22/21 1220     Visit Number 6    Number of Visits 24    Date for PT Re-Evaluation 06/21/21    PT Start Time [id number]    PT Stop Time 1230    PT Time Calculation (min) 45 min    Activity Tolerance Patient tolerated treatment well    Behavior During Therapy [facility] for tasks assessed/performed             Past Medical History:  Diagnosis Date   Ankylosing spondylitis ([place])    BPH (benign prostatic hyperplasia)    hx of   Diverticulosis    Lymphocytic colitis 01/15/2018   Pes planus    Right inguinal hernia    Tobacco user    Varicose veins     Past Surgical History:  Procedure Laterality Date   BACK SURGERY  12/2007   L 4 to L 5 fusion   BACK SURGERY  2012   disc repair   c spine ESI     [name]   INGUINAL HERNIA REPAIR  1994   INGUINAL HERNIA REPAIR Right 07/16/2020   Procedure: LAPAROSCOPIC RIGHT INGUINAL HERNIA WITH MESH;  Surgeon: [name], [name], MD;  Location: [facility];  Service: General;  Laterality: Right;   RTC surgery Left 2005    There were no vitals filed for this visit.   Subjective Assessment - 04/22/21 1144     Subjective Pt states he feels he is improving. He is performing HEP    Patient Stated Goals use Lt arm again    Currently in Pain? No/denies                               [facility] Adult PT Treatment/Exercise - 04/22/21 0001       Shoulder Exercises: Supine   Horizontal ABduction AAROM;Both;10 reps   and horiz add - with cane   External Rotation AAROM;Left;10 reps   support scaption   Flexion AAROM;Both;10 reps   with cane   ABduction  AAROM;Left;10 reps   with cane     Shoulder Exercises: Standing   Internal Rotation AAROM;Both;10 reps   cane behind back   Extension AAROM;Both;10 reps   cane     Shoulder Exercises: Pulleys   Flexion 2 minutes    Scaption 2 minutes      Shoulder Exercises: Isometric Strengthening   Flexion 5X5"    Extension 5X5"    External Rotation 5X5"    Internal Rotation 5X5"    ABduction 5X5"      Vasopneumatic   Number Minutes Vasopneumatic  10 minutes    Vasopnuematic Location  Shoulder    Vasopneumatic Pressure Low    Vasopneumatic Temperature  34 degrees      Manual Therapy   Soft tissue mobilization Lt pecs, biceps, upper trap    Passive ROM Lt shoulder PROM per protocol                      PT Short Term Goals - 04/20/21 1237       PT SHORT TERM GOAL #1  Title Instruct patient in initial shoulder exercises and postural correction    Time 6    Status Achieved    Target Date 05/10/21      PT SHORT TERM GOAL #2   Title PROM flexion 140 deg; ER at side 40 deg; abduction 60 deg    Time 6    Period Weeks    Status Achieved    Target Date 05/10/21      PT SHORT TERM GOAL #3   Title Improve posture and alignment with patient to demonstrate increased upright posture with posterior shoulder girdle musculature engaged    Time 6    Period Weeks    Status Achieved    Target Date 05/10/21      PT SHORT TERM GOAL #4   Title Patient to report sleeping in bed instead of recliner    Time 6    Period Weeks    Status On-going    Target Date 05/10/21               PT Long Term Goals - 03/29/21 1127       PT LONG TERM GOAL #1   Title Increase AROM Lt shoulder to within 5-10 deg of AROM Rt shoulder    Time 12    Period Weeks    Status New    Target Date 06/21/21      PT LONG TERM GOAL #2   Title Increase strength Lt shoulder to 4/5 to 4+/5 throughout    Time 12    Period Weeks    Status New    Target Date 06/21/21      PT LONG TERM GOAL #3   Title  Begin functional activities including ADL's to return patient to independent ADL's and return to work    Time 12    Period Weeks    Status New    Target Date 06/21/21      PT LONG TERM GOAL #4   Title Independent in HEP    Time 12    Period Weeks    Status New    Target Date 06/21/21      PT LONG TERM GOAL #5   Title Improve functional limitation score to 63    Time 12    Period Weeks    Status New    Target Date 06/21/21                   Plan - 04/22/21 1220     Clinical Impression Statement Pt continues to progress well with AAROM. Good form during isometrics with minimal cues required.    PT Next Visit Plan progress per protocol    PT Home Exercise Plan [id number]    Consulted and Agree with Plan of Care Patient             Patient will benefit from skilled therapeutic intervention in order to improve the following deficits and impairments:     Visit Diagnosis: Status post left rotator cuff repair  Abnormal posture  Other symptoms and signs involving the musculoskeletal system  Muscle weakness (generalized)     Problem List Patient Active Problem List   Diagnosis Date Noted   Rotator cuff tear, left 01/18/2021   Snoring 09/27/2020   Venous stasis 06/02/2020   IFG (impaired fasting glucose) 05/28/2019   Hiatal hernia 05/28/2019   BPPV (benign paroxysmal positional vertigo), left 11/26/2018   Allergic rhinitis due to pollen 11/26/2018   Lymphocytic colitis 01/15/2018  Right lumbar radiculopathy 09/12/2016   Hepatic cyst 11/11/2014   Cancer of skin, squamous cell 03/11/2014   Seborrheic dermatitis 09/05/2012   SCC (squamous cell carcinoma), leg 08/29/2011   Left cervical radiculopathy 01/02/2011   POSTURAL LIGHTHEADEDNESS 12/02/2010   CIGARETTE SMOKER 11/29/2010   Fatty liver 10/08/2009   CONSTIPATION, DRUG INDUCED 10/05/2009   DIVERTICULOSIS OF COLON 08/13/2009   [place] DISEASE, LUMBAR 07/08/2007   [place] DISEASE, CERVICAL 03/11/2007    BPH (benign prostatic hyperplasia) 02/13/2007   Hyperlipidemia 01/16/2007   ANKYLOSING SPONDYLITIS 12/05/2006   Essential hypertension, benign 12/05/2006   Edith Lord, PT  Ayinde Swim 04/22/2021, 12:22 PM  [facility] [place], [place], [address] Phone: [phone]   Fax:  [phone]  Name: Raymond Mitchell MRN: [id number] Date of Birth: 1956/07/20

## 2021-04-26 ENCOUNTER — Other Ambulatory Visit: Payer: Self-pay

## 2021-04-26 ENCOUNTER — Encounter: Payer: Self-pay | Admitting: Rehabilitative and Restorative Service Providers"

## 2021-04-26 ENCOUNTER — Ambulatory Visit (INDEPENDENT_AMBULATORY_CARE_PROVIDER_SITE_OTHER): Payer: BC Managed Care – PPO | Admitting: Rehabilitative and Restorative Service Providers"

## 2021-04-26 DIAGNOSIS — M6281 Muscle weakness (generalized): Secondary | ICD-10-CM

## 2021-04-26 DIAGNOSIS — Z9889 Other specified postprocedural states: Secondary | ICD-10-CM | POA: Diagnosis not present

## 2021-04-26 DIAGNOSIS — R293 Abnormal posture: Secondary | ICD-10-CM

## 2021-04-26 DIAGNOSIS — R29898 Other symptoms and signs involving the musculoskeletal system: Secondary | ICD-10-CM | POA: Diagnosis not present

## 2021-04-26 NOTE — Therapy (Signed)
[place], [place], [address] Phone: [phone]   Fax:  [phone]  Physical Therapy Treatment  Patient Details  Name: Raymond Mitchell MRN: [id number] Date of Birth: [birth year] Referring Provider (PT): Dr Ophelia Charter   Encounter Date: 04/26/2021   PT End of Session - 04/26/21 1151     Visit Number 7    Number of Visits 24    Date for PT Re-Evaluation 06/21/21    PT Start Time 1148    PT Stop Time 1233    PT Time Calculation (min) 45 min    Activity Tolerance Patient tolerated treatment well             Past Medical History:  Diagnosis Date   Ankylosing spondylitis ([place])    BPH (benign prostatic hyperplasia)    hx of   Diverticulosis    Lymphocytic colitis 01/15/2018   Pes planus    Right inguinal hernia    Tobacco user    Varicose veins     Past Surgical History:  Procedure Laterality Date   BACK SURGERY  12/2007   L 4 to L 5 fusion   BACK SURGERY  2012   disc repair   c spine ESI     [name]   INGUINAL HERNIA REPAIR  1994   INGUINAL HERNIA REPAIR Right 07/16/2020   Procedure: LAPAROSCOPIC RIGHT INGUINAL HERNIA WITH MESH;  Surgeon: [name], [name], MD;  Location: [facility];  Service: General;  Laterality: Right;   RTC surgery Left 2005    There were no vitals filed for this visit.   Subjective Assessment - 04/26/21 1152     Subjective Some soreness with some of the exercises. Feels it after exercises. Ice helps the soreness.    Currently in Pain? No/denies    Pain Score 0-No pain                [facility] PT Assessment - 04/26/21 0001       Assessment   Medical Diagnosis Lt RCR    Referring Provider (PT) Dr Ophelia Charter    Onset Date/Surgical Date 02/21/21    Hand Dominance Right    Next MD Visit 05/24/21    Prior Therapy for LB and Lt shoulder      PROM   Left Shoulder Flexion 151 Degrees    Left Shoulder ABduction 148 Degrees   in  scapular plane   Left Shoulder External Rotation 53 Degrees   in scapular pane                          [facility] Adult PT Treatment/Exercise - 04/26/21 0001       Shoulder Exercises: Supine   Horizontal ABduction Limitations Hold    External Rotation Limitations Hold    Flexion AAROM;Both;10 reps   with cane   ABduction Limitations Hold      Shoulder Exercises: Standing   External Rotation AAROM;Left;10 reps    Internal Rotation AAROM;Both;10 reps   cane behind back   Extension AAROM;Both;10 reps   cane   Other Standing Exercises scap squeeze 10 sec x 10 reps; chin tuck      Shoulder Exercises: Pulleys   Flexion --   10 reps 10 sec hold   Scaption --   10 reps 10 sec hold   Other Pulley Exercises ab/adduction for mobility elbows/shoulders 90/90 x 10      Shoulder  Exercises: Isometric Strengthening   Flexion 5X5"    Extension 5X5"    External Rotation 5X5"    Internal Rotation 5X5"    ABduction 5X5"      Vasopneumatic   Number Minutes Vasopneumatic  10 minutes    Vasopnuematic Location  Shoulder    Vasopneumatic Pressure Low    Vasopneumatic Temperature  34 degrees      Manual Therapy   Manual therapy comments pt supine    Soft tissue mobilization Lt pecs; biceps; anterior shoulder; upper trap;    Passive ROM Lt shoulder flexion; abduction and ER in scapular plane; shoulder extension                      PT Short Term Goals - 04/20/21 1237       PT SHORT TERM GOAL #1   Title Instruct patient in initial shoulder exercises and postural correction    Time 6    Status Achieved    Target Date 05/10/21      PT SHORT TERM GOAL #2   Title PROM flexion 140 deg; ER at side 40 deg; abduction 60 deg    Time 6    Period Weeks    Status Achieved    Target Date 05/10/21      PT SHORT TERM GOAL #3   Title Improve posture and alignment with patient to demonstrate increased upright posture with posterior shoulder girdle musculature engaged    Time  6    Period Weeks    Status Achieved    Target Date 05/10/21      PT SHORT TERM GOAL #4   Title Patient to report sleeping in bed instead of recliner    Time 6    Period Weeks    Status On-going    Target Date 05/10/21               PT Long Term Goals - 03/29/21 1127       PT LONG TERM GOAL #1   Title Increase AROM Lt shoulder to within 5-10 deg of AROM Rt shoulder    Time 12    Period Weeks    Status New    Target Date 06/21/21      PT LONG TERM GOAL #2   Title Increase strength Lt shoulder to 4/5 to 4+/5 throughout    Time 12    Period Weeks    Status New    Target Date 06/21/21      PT LONG TERM GOAL #3   Title Begin functional activities including ADL's to return patient to independent ADL's and return to work    Time 12    Period Weeks    Status New    Target Date 06/21/21      PT LONG TERM GOAL #4   Title Independent in HEP    Time 12    Period Weeks    Status New    Target Date 06/21/21      PT LONG TERM GOAL #5   Title Improve functional limitation score to 63    Time 12    Period Weeks    Status New    Target Date 06/21/21                   Plan - 04/26/21 1154     Clinical Impression Statement Some discomfort following exercises at home. Advised pt to continue using ice after exercise to address soreness. Progressing well  with shoulder rehab.    Rehab Potential Good    PT Frequency 2x / week    PT Duration 12 weeks    PT Treatment/Interventions ADLs/Self Care Home Management;Aquatic Therapy;Cryotherapy;Electrical Stimulation;Iontophoresis '4mg'$ /ml Dexamethasone;Moist Heat;Ultrasound;Therapeutic activities;Therapeutic exercise;Balance training;Neuromuscular re-education;Patient/family education;Manual techniques;Passive range of motion;Dry needling;Taping;Vasopneumatic Device    PT Next Visit Plan manual work and PROM/stretching; progress exercises per protocol; progress "light strengthening next week (10 weeks)    PT Home Exercise  Plan [id number]    Consulted and Agree with Plan of Care Patient             Patient will benefit from skilled therapeutic intervention in order to improve the following deficits and impairments:     Visit Diagnosis: Status post left rotator cuff repair  Abnormal posture  Other symptoms and signs involving the musculoskeletal system  Muscle weakness (generalized)     Problem List Patient Active Problem List   Diagnosis Date Noted   Rotator cuff tear, left 01/18/2021   Snoring 09/27/2020   Venous stasis 06/02/2020   IFG (impaired fasting glucose) 05/28/2019   Hiatal hernia 05/28/2019   BPPV (benign paroxysmal positional vertigo), left 11/26/2018   Allergic rhinitis due to pollen 11/26/2018   Lymphocytic colitis 01/15/2018   Right lumbar radiculopathy 09/12/2016   Hepatic cyst 11/11/2014   Cancer of skin, squamous cell 03/11/2014   Seborrheic dermatitis 09/05/2012   SCC (squamous cell carcinoma), leg 08/29/2011   Left cervical radiculopathy 01/02/2011   POSTURAL LIGHTHEADEDNESS 12/02/2010   CIGARETTE SMOKER 11/29/2010   Fatty liver 10/08/2009   CONSTIPATION, DRUG INDUCED 10/05/2009   DIVERTICULOSIS OF COLON 08/13/2009   [place] DISEASE, LUMBAR 07/08/2007   [place] DISEASE, CERVICAL 03/11/2007   BPH (benign prostatic hyperplasia) 02/13/2007   Hyperlipidemia 01/16/2007   ANKYLOSING SPONDYLITIS 12/05/2006   Essential hypertension, benign 12/05/2006    Temprance Wyre Nilda Simmer PT, MPH  04/26/2021, 12:38 PM  [facility] [place] [address] [place], [place], [address] Phone: [phone]   Fax:  [phone]  Name: SHADRACK CARABELLO MRN: [id number] Date of Birth: 11/22/1955

## 2021-04-28 ENCOUNTER — Other Ambulatory Visit: Payer: Self-pay

## 2021-04-28 ENCOUNTER — Ambulatory Visit (INDEPENDENT_AMBULATORY_CARE_PROVIDER_SITE_OTHER): Payer: BC Managed Care – PPO | Admitting: Rehabilitative and Restorative Service Providers"

## 2021-04-28 ENCOUNTER — Encounter: Payer: Self-pay | Admitting: Rehabilitative and Restorative Service Providers"

## 2021-04-28 DIAGNOSIS — M6281 Muscle weakness (generalized): Secondary | ICD-10-CM

## 2021-04-28 DIAGNOSIS — R29898 Other symptoms and signs involving the musculoskeletal system: Secondary | ICD-10-CM | POA: Diagnosis not present

## 2021-04-28 DIAGNOSIS — Z9889 Other specified postprocedural states: Secondary | ICD-10-CM | POA: Diagnosis not present

## 2021-04-28 DIAGNOSIS — R293 Abnormal posture: Secondary | ICD-10-CM | POA: Diagnosis not present

## 2021-04-28 NOTE — Patient Instructions (Signed)
Access Code: [email or web address] URL: [email or web address] Date: 04/28/2021 Prepared by: Gillermo Murdoch  Exercises Seated Cervical Retraction - 3 x daily - 7 x weekly - 10 reps - 1 sets Standing Scapular Retraction - 3 x daily - 7 x weekly - 10 reps - 1 sets - 10 hold Seated Shoulder Flexion Towel Slide at Table Top Full Range of Motion - 2 x daily - 7 x weekly - 1 sets - 5-10 reps - 10sec hold Circular Shoulder Pendulum with Table Support - 3-4 x daily - 7 x weekly - 1 sets - 20-30 reps Seated Shoulder External Rotation AAROM with Cane and Hand in Neutral - 2 x daily - 7 x weekly - 1 sets - 5-10 reps - 5-10 sec hold Standing Shoulder and Trunk Flexion at Table - 2 x daily - 7 x weekly - 1 sets - 5 reps - 5 sec hold Seated Shoulder Flexion AAROM with Pulley Behind - 2 x daily - 7 x weekly - 1 sets - 10 reps - 10 sec hold Standing Isometric Shoulder Extension with Doorway - Arm Bent - 2 x daily - 7 x weekly - 1 sets - 5-10 reps - 5 sec hold Standing Isometric Shoulder External Rotation with Doorway - 2 x daily - 7 x weekly - 1 sets - 5-10 reps - 5 sec hold Standing Isometric Shoulder Internal Rotation with Towel Roll at Doorway - 2 x daily - 7 x weekly - 1 sets - 5 reps - 5 sec hold Standing Isometric Shoulder Internal Rotation with Towel Roll at Doorway - 2 x daily - 7 x weekly - 1 sets - 5 reps - 5 sec hold Standing Isometric Shoulder Flexion with Doorway - Arm Bent - 2 x daily - 7 x weekly - 1 sets - 5 reps - 5 sec hold Supine Shoulder Flexion Extension AAROM with Dowel - 2 x daily - 7 x weekly - 1 sets - 5-10 reps - 3 sec hold Supine Shoulder External Rotation in 45 Degrees Abduction AAROM with Dowel - 2 x daily - 7 x weekly - 1 sets - 5-10 reps - 3-10 sec hold Standing Shoulder Extension with Dowel - 2 x daily - 7 x weekly - 1 sets - 5-10 reps - 3 seconds hold Standing Bilateral Shoulder Internal Rotation AAROM with Dowel - 2 x daily - 7 x weekly - 1 sets - 5-10 reps - 3 seconds  hold Standing Shoulder Row Reactive Isometric - 2 x daily - 7 x weekly - 1 sets - 10 reps - 3-5 sec hold Shoulder External Rotation Reactive Isometrics - 2 x daily - 7 x weekly - 1 sets - 10 reps - 3-5 sec hold Shoulder Internal Rotation Reactive Isometrics - 2 x daily - 7 x weekly - 1 sets - 10 reps - 3-5 sec hold Standing Shoulder Flexion Reactive Isometric - 2 x daily - 7 x weekly - 1 sets - 10 reps - 3-5 sec hold Sidelying Shoulder External Rotation - 2 x daily - 7 x weekly - 1 sets - 10 reps - 3 sec hold

## 2021-04-28 NOTE — Therapy (Signed)
[facility] [place], [place], [address] Phone: [phone]   Fax:  [phone]  Physical Therapy Treatment  Patient Details  Name: Raymond Mitchell MRN: [id number] Date of Birth: Sep 22, 1956 Referring Provider (PT): Dr Ophelia Charter   Encounter Date: 04/28/2021   PT End of Session - 04/28/21 1153     Visit Number 8    Number of Visits 24    Date for PT Re-Evaluation 06/21/21    PT Start Time [id number]    PT Stop Time [id number]    PT Time Calculation (min) 49 min    Activity Tolerance Patient tolerated treatment well             Past Medical History:  Diagnosis Date   Ankylosing spondylitis ([place])    BPH (benign prostatic hyperplasia)    hx of   Diverticulosis    Lymphocytic colitis 01/15/2018   Pes planus    Right inguinal hernia    Tobacco user    Varicose veins     Past Surgical History:  Procedure Laterality Date   BACK SURGERY  12/2007   L 4 to L 5 fusion   BACK SURGERY  2012   disc repair   c spine ESI     [name]   INGUINAL HERNIA REPAIR  1994   INGUINAL HERNIA REPAIR Right 07/16/2020   Procedure: LAPAROSCOPIC RIGHT INGUINAL HERNIA WITH MESH;  Surgeon: [name], [name], MD;  Location: [facility];  Service: General;  Laterality: Right;   RTC surgery Left 2005    There were no vitals filed for this visit.   Subjective Assessment - 04/28/21 1156     Subjective Patient reports no pain - working on his exercises at home    Currently in Pain? No/denies    Pain Score 0-No pain                               [facility] Adult PT Treatment/Exercise - 04/28/21 0001       Shoulder Exercises: Sidelying   External Rotation AROM;Left;10 reps      Shoulder Exercises: Standing   External Rotation AAROM;Left;10 reps    Internal Rotation AAROM;Both;5 reps   cane behind back hands together   Extension AAROM;Both;10 reps   cane   Other Standing Exercises scap  squeeze 10 sec x 10 reps; chin tuck      Shoulder Exercises: Pulleys   Flexion --   10 reps 10 sec hold   Scaption --   10 reps 10 sec hold     Shoulder Exercises: Isometric Strengthening   Flexion Limitations reactive isometric 10 reps x 5 sec yellow TB    Extension Limitations reactive isometric 10 reps x 5 sec hold yellow TB    External Rotation Limitations reactive isometric 10 reps 5 sec hold  yellow TB    Internal Rotation Limitations reactive isometric 10 reps x 5 sec hold yellow TB    ABduction 5X5"      Vasopneumatic   Number Minutes Vasopneumatic  10 minutes    Vasopnuematic Location  Shoulder    Vasopneumatic Pressure Low    Vasopneumatic Temperature  34 degrees      Manual Therapy   Manual therapy comments pt supine    Soft tissue mobilization Lt pecs; biceps; anterior shoulder; upper trap;    Scapular Mobilization Lt    Passive ROM Lt shoulder flexion;  abduction and ER in scapular plane; shoulder extension                    PT Education - 04/28/21 1232     Education Details HEP    Person(s) Educated Patient    Methods Explanation;Demonstration;Tactile cues;Verbal cues;Handout    Comprehension Verbalized understanding;Returned demonstration;Verbal cues required;Tactile cues required              PT Short Term Goals - 04/20/21 1237       PT SHORT TERM GOAL #1   Title Instruct patient in initial shoulder exercises and postural correction    Time 6    Status Achieved    Target Date 05/10/21      PT SHORT TERM GOAL #2   Title PROM flexion 140 deg; ER at side 40 deg; abduction 60 deg    Time 6    Period Weeks    Status Achieved    Target Date 05/10/21      PT SHORT TERM GOAL #3   Title Improve posture and alignment with patient to demonstrate increased upright posture with posterior shoulder girdle musculature engaged    Time 6    Period Weeks    Status Achieved    Target Date 05/10/21      PT SHORT TERM GOAL #4   Title Patient to  report sleeping in bed instead of recliner    Time 6    Period Weeks    Status On-going    Target Date 05/10/21               PT Long Term Goals - 03/29/21 1127       PT LONG TERM GOAL #1   Title Increase AROM Lt shoulder to within 5-10 deg of AROM Rt shoulder    Time 12    Period Weeks    Status New    Target Date 06/21/21      PT LONG TERM GOAL #2   Title Increase strength Lt shoulder to 4/5 to 4+/5 throughout    Time 12    Period Weeks    Status New    Target Date 06/21/21      PT LONG TERM GOAL #3   Title Begin functional activities including ADL's to return patient to independent ADL's and return to work    Time 12    Period Weeks    Status New    Target Date 06/21/21      PT LONG TERM GOAL #4   Title Independent in HEP    Time 12    Period Weeks    Status New    Target Date 06/21/21      PT LONG TERM GOAL #5   Title Improve functional limitation score to 63    Time 12    Period Weeks    Status New    Target Date 06/21/21                   Plan - 04/28/21 1157     Clinical Impression Statement Progressing well with shoulder rehab. Added reactive isometrics with yellow TB without difficulty. Added active Er in sidelying. Progressing well with shoulder rehab.    Rehab Potential Good    PT Frequency 2x / week    PT Duration 12 weeks    PT Treatment/Interventions ADLs/Self Care Home Management;Aquatic Therapy;Cryotherapy;Electrical Stimulation;Iontophoresis '4mg'$ /ml Dexamethasone;Moist Heat;Ultrasound;Therapeutic activities;Therapeutic exercise;Balance training;Neuromuscular re-education;Patient/family education;Manual techniques;Passive range of motion;Dry needling;Taping;Vasopneumatic Device  PT Next Visit Plan manual work and PROM/stretching; progress exercises per protocol; progress "light strengthening next week (10 weeks)- add TB exercises; ball on the wall    PT Home Exercise Plan [id number]    Consulted and Agree with Plan of Care Patient              Patient will benefit from skilled therapeutic intervention in order to improve the following deficits and impairments:     Visit Diagnosis: Status post left rotator cuff repair  Abnormal posture  Other symptoms and signs involving the musculoskeletal system  Muscle weakness (generalized)     Problem List Patient Active Problem List   Diagnosis Date Noted   Rotator cuff tear, left 01/18/2021   Snoring 09/27/2020   Venous stasis 06/02/2020   IFG (impaired fasting glucose) 05/28/2019   Hiatal hernia 05/28/2019   BPPV (benign paroxysmal positional vertigo), left 11/26/2018   Allergic rhinitis due to pollen 11/26/2018   Lymphocytic colitis 01/15/2018   Right lumbar radiculopathy 09/12/2016   Hepatic cyst 11/11/2014   Cancer of skin, squamous cell 03/11/2014   Seborrheic dermatitis 09/05/2012   SCC (squamous cell carcinoma), leg 08/29/2011   Left cervical radiculopathy 01/02/2011   POSTURAL LIGHTHEADEDNESS 12/02/2010   CIGARETTE SMOKER 11/29/2010   Fatty liver 10/08/2009   CONSTIPATION, DRUG INDUCED 10/05/2009   DIVERTICULOSIS OF COLON 08/13/2009   [place] DISEASE, LUMBAR 07/08/2007   [place] DISEASE, CERVICAL 03/11/2007   BPH (benign prostatic hyperplasia) 02/13/2007   Hyperlipidemia 01/16/2007   ANKYLOSING SPONDYLITIS 12/05/2006   Essential hypertension, benign 12/05/2006    Josalynn Johndrow Nilda Simmer PT,MPH  04/28/2021, 12:35 PM  [facility] [place] [address] [place], [place], [address] Phone: [phone]   Fax:  [phone]  Name: DAIMIEN VANDERZEE MRN: [id number] Date of Birth: 08-06-1956

## 2021-05-03 ENCOUNTER — Ambulatory Visit (INDEPENDENT_AMBULATORY_CARE_PROVIDER_SITE_OTHER): Payer: BC Managed Care – PPO | Admitting: Rehabilitative and Restorative Service Providers"

## 2021-05-03 ENCOUNTER — Other Ambulatory Visit: Payer: Self-pay

## 2021-05-03 DIAGNOSIS — R29898 Other symptoms and signs involving the musculoskeletal system: Secondary | ICD-10-CM

## 2021-05-03 DIAGNOSIS — Z9889 Other specified postprocedural states: Secondary | ICD-10-CM | POA: Diagnosis not present

## 2021-05-03 DIAGNOSIS — M6281 Muscle weakness (generalized): Secondary | ICD-10-CM

## 2021-05-03 DIAGNOSIS — R293 Abnormal posture: Secondary | ICD-10-CM | POA: Diagnosis not present

## 2021-05-03 NOTE — Patient Instructions (Signed)
Access Code: [email or web address] URL: [email or web address] Date: 05/03/2021 Prepared by: Gillermo Murdoch  Exercises Seated Cervical Retraction - 3 x daily - 7 x weekly - 10 reps - 1 sets Standing Scapular Retraction - 3 x daily - 7 x weekly - 10 reps - 1 sets - 10 hold Seated Shoulder Flexion Towel Slide at Table Top Full Range of Motion - 2 x daily - 7 x weekly - 1 sets - 5-10 reps - 10sec hold Circular Shoulder Pendulum with Table Support - 3-4 x daily - 7 x weekly - 1 sets - 20-30 reps Seated Shoulder External Rotation AAROM with Cane and Hand in Neutral - 2 x daily - 7 x weekly - 1 sets - 5-10 reps - 5-10 sec hold Standing Shoulder and Trunk Flexion at Table - 2 x daily - 7 x weekly - 1 sets - 5 reps - 5 sec hold Seated Shoulder Flexion AAROM with Pulley Behind - 2 x daily - 7 x weekly - 1 sets - 10 reps - 10 sec hold Standing Isometric Shoulder Extension with Doorway - Arm Bent - 2 x daily - 7 x weekly - 1 sets - 5-10 reps - 5 sec hold Standing Isometric Shoulder External Rotation with Doorway - 2 x daily - 7 x weekly - 1 sets - 5-10 reps - 5 sec hold Standing Isometric Shoulder Internal Rotation with Towel Roll at Doorway - 2 x daily - 7 x weekly - 1 sets - 5 reps - 5 sec hold Standing Isometric Shoulder Internal Rotation with Towel Roll at Doorway - 2 x daily - 7 x weekly - 1 sets - 5 reps - 5 sec hold Standing Isometric Shoulder Flexion with Doorway - Arm Bent - 2 x daily - 7 x weekly - 1 sets - 5 reps - 5 sec hold Supine Shoulder Flexion Extension AAROM with Dowel - 2 x daily - 7 x weekly - 1 sets - 5-10 reps - 3 sec hold Standing Shoulder Extension with Dowel - 2 x daily - 7 x weekly - 1 sets - 5-10 reps - 3 seconds hold Standing Bilateral Shoulder Internal Rotation AAROM with Dowel - 2 x daily - 7 x weekly - 1 sets - 5-10 reps - 3 seconds hold Shoulder External Rotation Reactive Isometrics - 2 x daily - 7 x weekly - 1 sets - 10 reps - 3-5 sec hold Shoulder Internal Rotation Reactive  Isometrics - 2 x daily - 7 x weekly - 1 sets - 10 reps - 3-5 sec hold Standing Shoulder Flexion Reactive Isometric - 2 x daily - 7 x weekly - 1 sets - 10 reps - 3-5 sec hold Sidelying Shoulder External Rotation - 2 x daily - 7 x weekly - 1 sets - 10 reps - 3 sec hold Scapular Retraction with Resistance - 2 x daily - 7 x weekly - 10 reps - 3 sets Scapular Retraction with Resistance Advanced - 2 x daily - 7 x weekly - 10 reps - 3 sets Supine Shoulder Rhythmic Stabilization- Horizontal Abduction/Adduction - 2 x daily - 7 x weekly - 1 sets - 5-10 reps

## 2021-05-03 NOTE — Therapy (Signed)
[facility] [place], [place], [address] Phone: [phone]   Fax:  [phone]  Physical Therapy Treatment  Patient Details  Name: Raymond Mitchell MRN: [id number] Date of Birth: 09/26/1956 Referring Provider (PT): Dr Ophelia Charter   Encounter Date: 05/03/2021   PT End of Session - 05/03/21 1153     Visit Number 9    Number of Visits 24    Date for PT Re-Evaluation 06/21/21    PT Start Time 1148    PT Stop Time 1238   ice pack end of treatment vaso unavailable   PT Time Calculation (min) 50 min    Activity Tolerance Patient tolerated treatment well             Past Medical History:  Diagnosis Date   Ankylosing spondylitis ([place])    BPH (benign prostatic hyperplasia)    hx of   Diverticulosis    Lymphocytic colitis 01/15/2018   Pes planus    Right inguinal hernia    Tobacco user    Varicose veins     Past Surgical History:  Procedure Laterality Date   BACK SURGERY  12/2007   L 4 to L 5 fusion   BACK SURGERY  2012   disc repair   c spine ESI     [name]   INGUINAL HERNIA REPAIR  1994   INGUINAL HERNIA REPAIR Right 07/16/2020   Procedure: LAPAROSCOPIC RIGHT INGUINAL HERNIA WITH MESH;  Surgeon: [name], [name], MD;  Location: [facility];  Service: General;  Laterality: Right;   RTC surgery Left 2005    There were no vitals filed for this visit.   Subjective Assessment - 05/03/21 1155     Subjective Occasional pain with the    Currently in Pain? No/denies    Pain Score 0-No pain    Pain Location Shoulder    Pain Orientation Left                [facility] PT Assessment - 05/03/21 0001       Assessment   Medical Diagnosis Lt RCR    Referring Provider (PT) Dr Ophelia Charter    Onset Date/Surgical Date 02/21/21    Hand Dominance Right    Next MD Visit 05/24/21    Prior Therapy for LB and Lt shoulder      PROM   Left Shoulder Extension 67 Degrees    Left  Shoulder Flexion 152 Degrees    Left Shoulder ABduction 150 Degrees   in scapular plane   Left Shoulder External Rotation 68 Degrees   in scapular pane                          [facility] Adult PT Treatment/Exercise - 05/03/21 0001       Shoulder Exercises: Sidelying   External Rotation AROM;Left;10 reps;Weights    External Rotation Weight (lbs) 1      Shoulder Exercises: Standing   External Rotation AAROM;Left;10 reps    Internal Rotation AAROM;Both;5 reps   cane behind back hands together   Extension AAROM;Both;10 reps;Strengthening;Theraband   cane   Theraband Level (Shoulder Extension) Level 2 (Red)    Row Strengthening;Both;10 reps;Theraband    Theraband Level (Shoulder Row) Level 2 (Red)    Other Standing Exercises scap squeeze 10 sec x 10 reps; chin tuck      Shoulder Exercises: Pulleys   Flexion --   10 reps 10  sec hold   Scaption --   10 reps 10 sec hold     Shoulder Exercises: Isometric Strengthening   Extension Limitations reactive isometric 10 reps x 5 sec hold red TB    External Rotation Limitations reactive isometric 10 reps 5 sec hold  red TB      Cryotherapy   Number Minutes Cryotherapy 10 Minutes    Cryotherapy Location Shoulder   Lt   Type of Cryotherapy Ice pack      Manual Therapy   Manual therapy comments pt supine    Soft tissue mobilization Lt pecs; biceps; anterior shoulder; upper trap;    Passive ROM Lt shoulder flexion; abduction and ER in scapular plane; shoulder extension                    PT Education - 05/03/21 1213     Education Details HEP    Person(s) Educated Patient    Methods Explanation;Demonstration;Tactile cues;Verbal cues;Handout    Comprehension Verbalized understanding;Returned demonstration;Verbal cues required;Tactile cues required              PT Short Term Goals - 04/20/21 1237       PT SHORT TERM GOAL #1   Title Instruct patient in initial shoulder exercises and postural correction    Time  6    Status Achieved    Target Date 05/10/21      PT SHORT TERM GOAL #2   Title PROM flexion 140 deg; ER at side 40 deg; abduction 60 deg    Time 6    Period Weeks    Status Achieved    Target Date 05/10/21      PT SHORT TERM GOAL #3   Title Improve posture and alignment with patient to demonstrate increased upright posture with posterior shoulder girdle musculature engaged    Time 6    Period Weeks    Status Achieved    Target Date 05/10/21      PT SHORT TERM GOAL #4   Title Patient to report sleeping in bed instead of recliner    Time 6    Period Weeks    Status On-going    Target Date 05/10/21               PT Long Term Goals - 03/29/21 1127       PT LONG TERM GOAL #1   Title Increase AROM Lt shoulder to within 5-10 deg of AROM Rt shoulder    Time 12    Period Weeks    Status New    Target Date 06/21/21      PT LONG TERM GOAL #2   Title Increase strength Lt shoulder to 4/5 to 4+/5 throughout    Time 12    Period Weeks    Status New    Target Date 06/21/21      PT LONG TERM GOAL #3   Title Begin functional activities including ADL's to return patient to independent ADL's and return to work    Time 12    Period Weeks    Status New    Target Date 06/21/21      PT LONG TERM GOAL #4   Title Independent in HEP    Time 12    Period Weeks    Status New    Target Date 06/21/21      PT LONG TERM GOAL #5   Title Improve functional limitation score to 63    Time 12  Period Weeks    Status New    Target Date 06/21/21                   Plan - 05/03/21 1201     Clinical Impression Statement --    Rehab Potential Good    PT Frequency 2x / week    PT Duration 12 weeks    PT Treatment/Interventions ADLs/Self Care Home Management;Aquatic Therapy;Cryotherapy;Electrical Stimulation;Iontophoresis '4mg'$ /ml Dexamethasone;Moist Heat;Ultrasound;Therapeutic activities;Therapeutic exercise;Balance training;Neuromuscular re-education;Patient/family  education;Manual techniques;Passive range of motion;Dry needling;Taping;Vasopneumatic Device    PT Next Visit Plan manual work and PROM/stretching; progress exercises per protocol; progress  with "light strengthening" progress TB exercises; add ball on the wall    PT Home Exercise Plan [id number]             Patient will benefit from skilled therapeutic intervention in order to improve the following deficits and impairments:     Visit Diagnosis: Status post left rotator cuff repair  Abnormal posture  Other symptoms and signs involving the musculoskeletal system  Muscle weakness (generalized)     Problem List Patient Active Problem List   Diagnosis Date Noted   Rotator cuff tear, left 01/18/2021   Snoring 09/27/2020   Venous stasis 06/02/2020   IFG (impaired fasting glucose) 05/28/2019   Hiatal hernia 05/28/2019   BPPV (benign paroxysmal positional vertigo), left 11/26/2018   Allergic rhinitis due to pollen 11/26/2018   Lymphocytic colitis 01/15/2018   Right lumbar radiculopathy 09/12/2016   Hepatic cyst 11/11/2014   Cancer of skin, squamous cell 03/11/2014   Seborrheic dermatitis 09/05/2012   SCC (squamous cell carcinoma), leg 08/29/2011   Left cervical radiculopathy 01/02/2011   POSTURAL LIGHTHEADEDNESS 12/02/2010   CIGARETTE SMOKER 11/29/2010   Fatty liver 10/08/2009   CONSTIPATION, DRUG INDUCED 10/05/2009   DIVERTICULOSIS OF COLON 08/13/2009   [place] DISEASE, LUMBAR 07/08/2007   [place] DISEASE, CERVICAL 03/11/2007   BPH (benign prostatic hyperplasia) 02/13/2007   Hyperlipidemia 01/16/2007   ANKYLOSING SPONDYLITIS 12/05/2006   Essential hypertension, benign 12/05/2006    Yee Gangi Nilda Simmer PT, MPH  05/03/2021, 12:32 PM  [facility] [place] [address] [place], [place], [address] Phone: [phone]   Fax:  [phone]  Name: Raymond Mitchell MRN: [id number] Date of Birth: Feb 17, 1956

## 2021-05-05 ENCOUNTER — Encounter: Payer: Self-pay | Admitting: Rehabilitative and Restorative Service Providers"

## 2021-05-05 ENCOUNTER — Other Ambulatory Visit: Payer: Self-pay

## 2021-05-05 ENCOUNTER — Ambulatory Visit (INDEPENDENT_AMBULATORY_CARE_PROVIDER_SITE_OTHER): Payer: BC Managed Care – PPO | Admitting: Rehabilitative and Restorative Service Providers"

## 2021-05-05 DIAGNOSIS — R29898 Other symptoms and signs involving the musculoskeletal system: Secondary | ICD-10-CM | POA: Diagnosis not present

## 2021-05-05 DIAGNOSIS — M6281 Muscle weakness (generalized): Secondary | ICD-10-CM | POA: Diagnosis not present

## 2021-05-05 DIAGNOSIS — R293 Abnormal posture: Secondary | ICD-10-CM | POA: Diagnosis not present

## 2021-05-05 DIAGNOSIS — Z9889 Other specified postprocedural states: Secondary | ICD-10-CM

## 2021-05-05 NOTE — Therapy (Signed)
[place], [place], [address] Phone: [phone]   Fax:  [phone]  Physical Therapy Treatment  Patient Details  Name: Raymond Mitchell MRN: [id number] Date of Birth: April 08, 1956 Referring Provider (PT): Dr Ophelia Charter   Encounter Date: 05/05/2021   PT End of Session - 05/05/21 1153     Visit Number 10    Number of Visits 24    Date for PT Re-Evaluation 06/21/21    PT Start Time [id number]    PT Stop Time 1238    PT Time Calculation (min) 53 min    Activity Tolerance Patient tolerated treatment well             Past Medical History:  Diagnosis Date   Ankylosing spondylitis ([place])    BPH (benign prostatic hyperplasia)    hx of   Diverticulosis    Lymphocytic colitis 01/15/2018   Pes planus    Right inguinal hernia    Tobacco user    Varicose veins     Past Surgical History:  Procedure Laterality Date   BACK SURGERY  12/2007   L 4 to L 5 fusion   BACK SURGERY  2012   disc repair   c spine ESI     [name]   INGUINAL HERNIA REPAIR  1994   INGUINAL HERNIA REPAIR Right 07/16/2020   Procedure: LAPAROSCOPIC RIGHT INGUINAL HERNIA WITH MESH;  Surgeon: [name], [name], MD;  Location: [facility];  Service: General;  Laterality: Right;   RTC surgery Left 2005    There were no vitals filed for this visit.   Subjective Assessment - 05/05/21 1154     Subjective No pain - sometimes has discomfort at the end of his work day. Will stretch and pain resolves.    Currently in Pain? No/denies    Pain Score 0-No pain    Pain Location Shoulder    Pain Orientation Left                               [facility] Adult PT Treatment/Exercise - 05/05/21 0001       Shoulder Exercises: Sidelying   External Rotation AROM;Left;10 reps;Weights    External Rotation Weight (lbs) 1    ABduction Strengthening;Left;10 reps    ABduction Limitations activation of deltoid       Shoulder Exercises: Standing   External Rotation AROM;Both;10 reps    Internal Rotation AAROM;Left;5 reps   strap behind back   Extension AAROM;Both;10 reps;Strengthening;Theraband   cane   Theraband Level (Shoulder Extension) Level 2 (Red)    Row Strengthening;Both;10 reps;Theraband    Theraband Level (Shoulder Row) Level 2 (Red)    Other Standing Exercises scap squeeze 10 sec x 10 reps; chin tuck      Shoulder Exercises: Pulleys   Flexion --   10 reps 10 sec hold   Scaption --   10 reps 10 sec hold     Shoulder Exercises: Therapy Ball   Flexion Limitations ball on wall up/down; side to side; circles CW/CCW 30 sec each    Other Therapy Ball Exercises ball on wall ER x 1 min making small circles      Shoulder Exercises: Isometric Strengthening   Extension Limitations reactive isometric 10 reps x 5 sec hold red TB    External Rotation Limitations reactive isometric 10 reps 5 sec hold  red TB    Internal  Rotation Limitations reactive isometric 10 reps x 5 sec hold red TB      Shoulder Exercises: Stretch   Other Shoulder Stretches biceps stretch 20 sec x 3 reps      Vasopneumatic   Number Minutes Vasopneumatic  10 minutes    Vasopnuematic Location  Shoulder    Vasopneumatic Pressure Low    Vasopneumatic Temperature  34 degrees      Manual Therapy   Manual therapy comments pt supine    Soft tissue mobilization Lt pecs; biceps; anterior shoulder; upper trap; posterior shoulder girdle    Scapular Mobilization Lt    Passive ROM Lt shoulder flexion; abduction and ER in scapular plane; shoulder extension                      PT Short Term Goals - 04/20/21 1237       PT SHORT TERM GOAL #1   Title Instruct patient in initial shoulder exercises and postural correction    Time 6    Status Achieved    Target Date 05/10/21      PT SHORT TERM GOAL #2   Title PROM flexion 140 deg; ER at side 40 deg; abduction 60 deg    Time 6    Period Weeks    Status Achieved     Target Date 05/10/21      PT SHORT TERM GOAL #3   Title Improve posture and alignment with patient to demonstrate increased upright posture with posterior shoulder girdle musculature engaged    Time 6    Period Weeks    Status Achieved    Target Date 05/10/21      PT SHORT TERM GOAL #4   Title Patient to report sleeping in bed instead of recliner    Time 6    Period Weeks    Status On-going    Target Date 05/10/21               PT Long Term Goals - 03/29/21 1127       PT LONG TERM GOAL #1   Title Increase AROM Lt shoulder to within 5-10 deg of AROM Rt shoulder    Time 12    Period Weeks    Status New    Target Date 06/21/21      PT LONG TERM GOAL #2   Title Increase strength Lt shoulder to 4/5 to 4+/5 throughout    Time 12    Period Weeks    Status New    Target Date 06/21/21      PT LONG TERM GOAL #3   Title Begin functional activities including ADL's to return patient to independent ADL's and return to work    Time 12    Period Weeks    Status New    Target Date 06/21/21      PT LONG TERM GOAL #4   Title Independent in HEP    Time 12    Period Weeks    Status New    Target Date 06/21/21      PT LONG TERM GOAL #5   Title Improve functional limitation score to 63    Time 12    Period Weeks    Status New    Target Date 06/21/21                   Plan - 05/05/21 1155     Clinical Impression Statement No problem adding light resistive exercises for  home. Continues to progress well with shoulder rehab.    Rehab Potential Good    PT Frequency 2x / week    PT Duration 12 weeks    PT Treatment/Interventions ADLs/Self Care Home Management;Aquatic Therapy;Cryotherapy;Electrical Stimulation;Iontophoresis '4mg'$ /ml Dexamethasone;Moist Heat;Ultrasound;Therapeutic activities;Therapeutic exercise;Balance training;Neuromuscular re-education;Patient/family education;Manual techniques;Passive range of motion;Dry needling;Taping;Vasopneumatic Device    PT  Next Visit Plan manual work and PROM/stretching; progress exercises per protocol; progress  with "light strengthening" progress TB exercises    PT Home Exercise Plan [email or web address]    Consulted and Agree with Plan of Care Patient             Patient will benefit from skilled therapeutic intervention in order to improve the following deficits and impairments:     Visit Diagnosis: Status post left rotator cuff repair  Abnormal posture  Other symptoms and signs involving the musculoskeletal system  Muscle weakness (generalized)     Problem List Patient Active Problem List   Diagnosis Date Noted   Rotator cuff tear, left 01/18/2021   Snoring 09/27/2020   Venous stasis 06/02/2020   IFG (impaired fasting glucose) 05/28/2019   Hiatal hernia 05/28/2019   BPPV (benign paroxysmal positional vertigo), left 11/26/2018   Allergic rhinitis due to pollen 11/26/2018   Lymphocytic colitis 01/15/2018   Right lumbar radiculopathy 09/12/2016   Hepatic cyst 11/11/2014   Cancer of skin, squamous cell 03/11/2014   Seborrheic dermatitis 09/05/2012   SCC (squamous cell carcinoma), leg 08/29/2011   Left cervical radiculopathy 01/02/2011   POSTURAL LIGHTHEADEDNESS 12/02/2010   CIGARETTE SMOKER 11/29/2010   Fatty liver 10/08/2009   CONSTIPATION, DRUG INDUCED 10/05/2009   DIVERTICULOSIS OF COLON 08/13/2009   [place] DISEASE, LUMBAR 07/08/2007   [place] DISEASE, CERVICAL 03/11/2007   BPH (benign prostatic hyperplasia) 02/13/2007   Hyperlipidemia 01/16/2007   ANKYLOSING SPONDYLITIS 12/05/2006   Essential hypertension, benign 12/05/2006    Aryianna Earwood Nilda Simmer PT, MPH  05/05/2021, 12:29 PM  [place] [address] [place], [place], [address] Phone: [phone]   Fax:  [phone]  Name: Raymond Mitchell MRN: [id number] Date of Birth: [birth year]

## 2021-05-11 ENCOUNTER — Other Ambulatory Visit: Payer: Self-pay

## 2021-05-11 ENCOUNTER — Ambulatory Visit (INDEPENDENT_AMBULATORY_CARE_PROVIDER_SITE_OTHER): Payer: BC Managed Care – PPO | Admitting: Physical Therapy

## 2021-05-11 DIAGNOSIS — M6281 Muscle weakness (generalized): Secondary | ICD-10-CM | POA: Diagnosis not present

## 2021-05-11 DIAGNOSIS — Z9889 Other specified postprocedural states: Secondary | ICD-10-CM | POA: Diagnosis not present

## 2021-05-11 DIAGNOSIS — R293 Abnormal posture: Secondary | ICD-10-CM | POA: Diagnosis not present

## 2021-05-11 DIAGNOSIS — R29898 Other symptoms and signs involving the musculoskeletal system: Secondary | ICD-10-CM

## 2021-05-11 NOTE — Therapy (Signed)
[place], [place], [address] Phone: [phone]   Fax:  [phone]  Physical Therapy Treatment  Patient Details  Name: Raymond Mitchell MRN: [id number] Date of Birth: [birth year] Referring Provider (PT): Dr Ophelia Charter   Encounter Date: 05/11/2021   PT End of Session - 05/11/21 1226     Visit Number 11    Number of Visits 24    Date for PT Re-Evaluation 06/21/21    PT Start Time 1152   pt arrived late   PT Stop Time 1234    PT Time Calculation (min) 42 min    Activity Tolerance Patient tolerated treatment well    Behavior During Therapy [facility] for tasks assessed/performed             Past Medical History:  Diagnosis Date   Ankylosing spondylitis ([place])    BPH (benign prostatic hyperplasia)    hx of   Diverticulosis    Lymphocytic colitis 01/15/2018   Pes planus    Right inguinal hernia    Tobacco user    Varicose veins     Past Surgical History:  Procedure Laterality Date   BACK SURGERY  12/2007   L 4 to L 5 fusion   BACK SURGERY  2012   disc repair   c spine ESI     [name]   INGUINAL HERNIA REPAIR  1994   INGUINAL HERNIA REPAIR Right 07/16/2020   Procedure: LAPAROSCOPIC RIGHT INGUINAL HERNIA WITH MESH;  Surgeon: [name], [name], MD;  Location: [facility];  Service: General;  Laterality: Right;   RTC surgery Left 2005    There were no vitals filed for this visit.   Subjective Assessment - 05/11/21 1233     Subjective Pt reports no new changes.    Pertinent History Lt shoulder sx 2005; Lumbar surgeries x 2; HNP cervical; HTN    Patient Stated Goals use Lt arm again    Currently in Pain? No/denies    Pain Score 0-No pain                [facility] PT Assessment - 05/11/21 0001       AROM - taken in standing    Left Shoulder Extension 64 Degrees    Left Shoulder Flexion 145 Degrees    Left Shoulder ABduction 138 Degrees    Left Shoulder  Internal Rotation --   thumb to T9   Left Shoulder External Rotation 65 Degrees   abdct 90, elbow 90 deg.              [facility] Adult PT Treatment/Exercise - 05/11/21 0001       Shoulder Exercises: Standing   Extension Strengthening;Left;10 reps    Theraband Level (Shoulder Extension) Level 2 (Red)   to neutral, scap squeeze; 2 sets   Row Strengthening;Left;Both;10 reps    Theraband Level (Shoulder Row) Level 2 (Red)   3 sec hold, 2 stes     Shoulder Exercises: Pulleys   Flexion --   10 reps 10 sec hold   Scaption --   10 reps 10 sec hold   Other Pulley Exercises IR with LUE, 10 reps      Shoulder Exercises: Isometric Strengthening   External Rotation Limitations reactive isometric 10 reps 3 sec hold  red TB; 2 sets    Internal Rotation Limitations reactive isometric 10 reps x 3 sec hold red TB; 2 sts      Shoulder  Exercises: Psychologist, sport and exercise 3 reps;20 seconds    Other Shoulder Stretches --      Vasopneumatic   Number Minutes Vasopneumatic  10 minutes    Vasopnuematic Location  Shoulder   Lt   Vasopneumatic Pressure Medium    Vasopneumatic Temperature  34      Manual Therapy   Soft tissue mobilization Lt upper trap, levator    Scapular Mobilization Lt    Passive ROM Lt shoulder ER in scapular plane                      PT Short Term Goals - 05/11/21 1232       PT SHORT TERM GOAL #1   Title Instruct patient in initial shoulder exercises and postural correction    Time 6    Status Achieved    Target Date 05/10/21      PT SHORT TERM GOAL #2   Title PROM flexion 140 deg; ER at side 40 deg; abduction 60 deg    Time 6    Period Weeks    Status Achieved    Target Date 05/10/21      PT SHORT TERM GOAL #3   Title Improve posture and alignment with patient to demonstrate increased upright posture with posterior shoulder girdle musculature engaged    Time 6    Period Weeks    Status Achieved    Target Date 05/10/21      PT SHORT TERM GOAL  #4   Title Patient to report sleeping in bed instead of recliner    Time 6    Period Weeks    Status Achieved    Target Date 05/10/21               PT Long Term Goals - 05/11/21 1232       PT LONG TERM GOAL #1   Title Increase AROM Lt shoulder to within 5-10 deg of AROM Rt shoulder    Time 12    Period Weeks    Status On-going      PT LONG TERM GOAL #2   Title Increase strength Lt shoulder to 4/5 to 4+/5 throughout    Time 12    Period Weeks    Status On-going      PT LONG TERM GOAL #3   Title Begin functional activities including ADL's to return patient to independent ADL's and return to work    Time 12    Period Weeks    Status Partially Met      PT LONG TERM GOAL #4   Title Independent in HEP    Time 12    Period Weeks    Status On-going      PT LONG TERM GOAL #5   Title Improve functional limitation score to 63    Time 12    Period Weeks    Status On-going                   Plan - 05/11/21 1228     Clinical Impression Statement Pt's Lt shoulder ROM progressing well.  He tolerated all light resistance exercises well, without difficulty.  Progressing well towards goals. Will start next phase of protocol on 05/17/21.    Rehab Potential Good    PT Frequency 2x / week    PT Duration 12 weeks    PT Treatment/Interventions ADLs/Self Care Home Management;Aquatic Therapy;Cryotherapy;Electrical Stimulation;Iontophoresis 52m/ml Dexamethasone;Moist Heat;Ultrasound;Therapeutic activities;Therapeutic exercise;Balance training;Neuromuscular re-education;Patient/family education;Manual  techniques;Passive range of motion;Dry needling;Taping;Vasopneumatic Device    PT Next Visit Plan manual work and PROM/stretching; progress exercises per protocol; progress  with "light strengthening" progress TB exercises    PT Home Exercise Plan [id number]    Consulted and Agree with Plan of Care Patient             Patient will benefit from skilled therapeutic intervention  in order to improve the following deficits and impairments:  Decreased range of motion, Impaired UE functional use, Decreased activity tolerance, Pain, Hypomobility, Impaired flexibility, Improper body mechanics, Other (comment), Decreased mobility, Decreased strength, Increased edema, Postural dysfunction  Visit Diagnosis: Status post left rotator cuff repair  Abnormal posture  Other symptoms and signs involving the musculoskeletal system  Muscle weakness (generalized)     Problem List Patient Active Problem List   Diagnosis Date Noted   Rotator cuff tear, left 01/18/2021   Snoring 09/27/2020   Venous stasis 06/02/2020   IFG (impaired fasting glucose) 05/28/2019   Hiatal hernia 05/28/2019   BPPV (benign paroxysmal positional vertigo), left 11/26/2018   Allergic rhinitis due to pollen 11/26/2018   Lymphocytic colitis 01/15/2018   Right lumbar radiculopathy 09/12/2016   Hepatic cyst 11/11/2014   Cancer of skin, squamous cell 03/11/2014   Seborrheic dermatitis 09/05/2012   SCC (squamous cell carcinoma), leg 08/29/2011   Left cervical radiculopathy 01/02/2011   POSTURAL LIGHTHEADEDNESS 12/02/2010   CIGARETTE SMOKER 11/29/2010   Fatty liver 10/08/2009   CONSTIPATION, DRUG INDUCED 10/05/2009   DIVERTICULOSIS OF COLON 08/13/2009   [place] DISEASE, LUMBAR 07/08/2007   [place] DISEASE, CERVICAL 03/11/2007   BPH (benign prostatic hyperplasia) 02/13/2007   Hyperlipidemia 01/16/2007   ANKYLOSING SPONDYLITIS 12/05/2006   Essential hypertension, benign 12/05/2006   Kerin Perna, PTA 05/11/21 12:35 PM   [place] [address] [place], [place], [address] Phone: [phone]   Fax:  [phone]  Name: Raymond Mitchell MRN: [id number] Date of Birth: 06-02-1956

## 2021-05-13 ENCOUNTER — Other Ambulatory Visit: Payer: Self-pay

## 2021-05-13 ENCOUNTER — Ambulatory Visit (INDEPENDENT_AMBULATORY_CARE_PROVIDER_SITE_OTHER): Payer: BC Managed Care – PPO | Admitting: Physical Therapy

## 2021-05-13 DIAGNOSIS — R293 Abnormal posture: Secondary | ICD-10-CM | POA: Diagnosis not present

## 2021-05-13 DIAGNOSIS — R29898 Other symptoms and signs involving the musculoskeletal system: Secondary | ICD-10-CM | POA: Diagnosis not present

## 2021-05-13 DIAGNOSIS — Z9889 Other specified postprocedural states: Secondary | ICD-10-CM

## 2021-05-13 DIAGNOSIS — M6281 Muscle weakness (generalized): Secondary | ICD-10-CM | POA: Diagnosis not present

## 2021-05-13 NOTE — Therapy (Signed)
[place], [place], [address] Phone: [phone]   Fax:  [phone]  Physical Therapy Treatment  Patient Details  Name: Raymond Mitchell MRN: [id number] Date of Birth: 05-18-1956 Referring Provider (PT): Dr Ophelia Charter   Encounter Date: 05/13/2021   PT End of Session - 05/13/21 1159     Visit Number 12    Number of Visits 24    Date for PT Re-Evaluation 06/21/21    PT Start Time 1148    PT Stop Time 1232    PT Time Calculation (min) 44 min    Activity Tolerance Patient tolerated treatment well    Behavior During Therapy [facility] for tasks assessed/performed             Past Medical History:  Diagnosis Date   Ankylosing spondylitis ([place])    BPH (benign prostatic hyperplasia)    hx of   Diverticulosis    Lymphocytic colitis 01/15/2018   Pes planus    Right inguinal hernia    Tobacco user    Varicose veins     Past Surgical History:  Procedure Laterality Date   BACK SURGERY  12/2007   L 4 to L 5 fusion   BACK SURGERY  2012   disc repair   c spine ESI     [name]   INGUINAL HERNIA REPAIR  1994   INGUINAL HERNIA REPAIR Right 07/16/2020   Procedure: LAPAROSCOPIC RIGHT INGUINAL HERNIA WITH MESH;  Surgeon: [name], [name], MD;  Location: [place];  Service: General;  Laterality: Right;   RTC surgery Left 2005    There were no vitals filed for this visit.   Subjective Assessment - 05/13/21 1200     Subjective Pt reports he tried to put hands behind head in recliner. He was able to do it, but didn't want to leave it there too long; instead rested hands on forehead.    Currently in Pain? No/denies    Pain Score 0-No pain                [facility] PT Assessment - 05/13/21 0001       Assessment   Medical Diagnosis Lt RCR    Referring Provider (PT) Dr Ophelia Charter    Onset Date/Surgical Date 02/21/21    Hand Dominance Right    Next MD Visit 05/24/21     Prior Therapy for LB and Lt shoulder              [facility] Adult PT Treatment/Exercise - 05/13/21 0001       Shoulder Exercises: Standing   Internal Rotation AAROM;Both;10 reps   cane behind back, elbows forward.   Other Standing Exercises LUE moving rings on arch (table at waist height) x 6 to L/R, 2 sets with pendulum in between sets.      Shoulder Exercises: Pulleys   Flexion --   10 reps 10 sec hold   Scaption --   10 reps 10 sec hold     Shoulder Exercises: Isometric Strengthening   External Rotation Limitations reactive isometric 10 reps 5 sec hold  red TB    Internal Rotation Limitations reactive isometric 10 reps x 5 sec hold red TB      Shoulder Exercises: Stretch   Wall Stretch - ABduction 5 reps   cues for form, scaption   Star Gazer Stretch 3 reps;20 seconds    Other Shoulder Stretches Lt bicep stretch x 10sec x  5 reps      Vasopneumatic   Number Minutes Vasopneumatic  10 minutes    Vasopnuematic Location  Shoulder   Lt   Vasopneumatic Pressure Medium    Vasopneumatic Temperature  34      Manual Therapy   Passive ROM Lt shoulder into flexion, scaption, ext, ER                      PT Short Term Goals - 05/11/21 1232       PT SHORT TERM GOAL #1   Title Instruct patient in initial shoulder exercises and postural correction    Time 6    Status Achieved    Target Date 05/10/21      PT SHORT TERM GOAL #2   Title PROM flexion 140 deg; ER at side 40 deg; abduction 60 deg    Time 6    Period Weeks    Status Achieved    Target Date 05/10/21      PT SHORT TERM GOAL #3   Title Improve posture and alignment with patient to demonstrate increased upright posture with posterior shoulder girdle musculature engaged    Time 6    Period Weeks    Status Achieved    Target Date 05/10/21      PT SHORT TERM GOAL #4   Title Patient to report sleeping in bed instead of recliner    Time 6    Period Weeks    Status Achieved    Target Date 05/10/21                PT Long Term Goals - 05/11/21 1232       PT LONG TERM GOAL #1   Title Increase AROM Lt shoulder to within 5-10 deg of AROM Rt shoulder    Time 12    Period Weeks    Status On-going      PT LONG TERM GOAL #2   Title Increase strength Lt shoulder to 4/5 to 4+/5 throughout    Time 12    Period Weeks    Status On-going      PT LONG TERM GOAL #3   Title Begin functional activities including ADL's to return patient to independent ADL's and return to work    Time 12    Period Weeks    Status Partially Met      PT LONG TERM GOAL #4   Title Independent in HEP    Time 12    Period Weeks    Status On-going      PT LONG TERM GOAL #5   Title Improve functional limitation score to 63    Time 12    Period Weeks    Status On-going                   Plan - 05/13/21 1240     Clinical Impression Statement Pt's Lt shoulder fatigues quickly with AROM at/just above shoulder height (ie: moving rings on arch). He reported some discomfort in L ant shoulder with active horiz add; adjusted posture with improvement in symptoms.  Pt progressing well in this phase of rehab.    Rehab Potential Good    PT Frequency 2x / week    PT Duration 12 weeks    PT Treatment/Interventions ADLs/Self Care Home Management;Aquatic Therapy;Cryotherapy;Electrical Stimulation;Iontophoresis 90m/ml Dexamethasone;Moist Heat;Ultrasound;Therapeutic activities;Therapeutic exercise;Balance training;Neuromuscular re-education;Patient/family education;Manual techniques;Passive range of motion;Dry needling;Taping;Vasopneumatic Device    PT Next Visit Plan manual work and  PROM/stretching; progress exercises per protocol; progress  with "light strengthening" progress TB exercises - begins next phase 8/16    PT Home Exercise Plan [id number]    Consulted and Agree with Plan of Care Patient             Patient will benefit from skilled therapeutic intervention in order to improve the following deficits and  impairments:  Decreased range of motion, Impaired UE functional use, Decreased activity tolerance, Pain, Hypomobility, Impaired flexibility, Improper body mechanics, Other (comment), Decreased mobility, Decreased strength, Increased edema, Postural dysfunction  Visit Diagnosis: Status post left rotator cuff repair  Abnormal posture  Other symptoms and signs involving the musculoskeletal system  Muscle weakness (generalized)     Problem List Patient Active Problem List   Diagnosis Date Noted   Rotator cuff tear, left 01/18/2021   Snoring 09/27/2020   Venous stasis 06/02/2020   IFG (impaired fasting glucose) 05/28/2019   Hiatal hernia 05/28/2019   BPPV (benign paroxysmal positional vertigo), left 11/26/2018   Allergic rhinitis due to pollen 11/26/2018   Lymphocytic colitis 01/15/2018   Right lumbar radiculopathy 09/12/2016   Hepatic cyst 11/11/2014   Cancer of skin, squamous cell 03/11/2014   Seborrheic dermatitis 09/05/2012   SCC (squamous cell carcinoma), leg 08/29/2011   Left cervical radiculopathy 01/02/2011   POSTURAL LIGHTHEADEDNESS 12/02/2010   CIGARETTE SMOKER 11/29/2010   Fatty liver 10/08/2009   CONSTIPATION, DRUG INDUCED 10/05/2009   DIVERTICULOSIS OF COLON 08/13/2009   [place] DISEASE, LUMBAR 07/08/2007   [place] DISEASE, CERVICAL 03/11/2007   BPH (benign prostatic hyperplasia) 02/13/2007   Hyperlipidemia 01/16/2007   ANKYLOSING SPONDYLITIS 12/05/2006   Essential hypertension, benign 12/05/2006   Kerin Perna, PTA 05/13/21 12:44 PM   [place] [address] [place], [place], [address] Phone: [phone]   Fax:  [phone]  Name: Raymond Mitchell MRN: [id number] Date of Birth: 1956-05-22

## 2021-05-18 ENCOUNTER — Encounter: Payer: Self-pay | Admitting: Rehabilitative and Restorative Service Providers"

## 2021-05-18 ENCOUNTER — Other Ambulatory Visit: Payer: Self-pay

## 2021-05-18 ENCOUNTER — Ambulatory Visit (INDEPENDENT_AMBULATORY_CARE_PROVIDER_SITE_OTHER): Payer: BC Managed Care – PPO | Admitting: Rehabilitative and Restorative Service Providers"

## 2021-05-18 DIAGNOSIS — R293 Abnormal posture: Secondary | ICD-10-CM | POA: Diagnosis not present

## 2021-05-18 DIAGNOSIS — Z9889 Other specified postprocedural states: Secondary | ICD-10-CM | POA: Diagnosis not present

## 2021-05-18 DIAGNOSIS — M6281 Muscle weakness (generalized): Secondary | ICD-10-CM

## 2021-05-18 DIAGNOSIS — R29898 Other symptoms and signs involving the musculoskeletal system: Secondary | ICD-10-CM | POA: Diagnosis not present

## 2021-05-18 NOTE — Therapy (Signed)
[place], [place], [address] Phone: [phone]   Fax:  [phone]  Physical Therapy Treatment  Patient Details  Name: Raymond Mitchell MRN: [id number] Date of Birth: 1956/02/14 Referring Provider (PT): Dr Ophelia Charter   Encounter Date: 05/18/2021   PT End of Session - 05/18/21 1146     Visit Number 13    Number of Visits 24    Date for PT Re-Evaluation 06/21/21    PT Start Time 8413    PT Stop Time 1234    PT Time Calculation (min) 50 min    Activity Tolerance Patient tolerated treatment well             Past Medical History:  Diagnosis Date   Ankylosing spondylitis ([place])    BPH (benign prostatic hyperplasia)    hx of   Diverticulosis    Lymphocytic colitis 01/15/2018   Pes planus    Right inguinal hernia    Tobacco user    Varicose veins     Past Surgical History:  Procedure Laterality Date   BACK SURGERY  12/2007   L 4 to L 5 fusion   BACK SURGERY  2012   disc repair   c spine ESI     [name]   INGUINAL HERNIA REPAIR  1994   INGUINAL HERNIA REPAIR Right 07/16/2020   Procedure: LAPAROSCOPIC RIGHT INGUINAL HERNIA WITH MESH;  Surgeon: [name], [name], MD;  Location: [facility];  Service: General;  Laterality: Right;   RTC surgery Left 2005    There were no vitals filed for this visit.   Subjective Assessment - 05/18/21 1146     Subjective Patient reports that he was a little sore from new exercises but that resolved pretty quickly.    Currently in Pain? No/denies    Pain Score 0-No pain    Pain Location Shoulder    Pain Orientation Left                [facility] PT Assessment - 05/18/21 0001       Assessment   Medical Diagnosis Lt RCR    Referring Provider (PT) Dr Ophelia Charter    Onset Date/Surgical Date 02/21/21    Hand Dominance Right    Next MD Visit 05/24/21    Prior Therapy for LB and Lt shoulder      PROM   Left Shoulder Extension  70 Degrees    Left Shoulder Flexion 161 Degrees    Left Shoulder ABduction 164 Degrees   in scapular plane   Left Shoulder External Rotation 72 Degrees   in scapular plane                          [facility] Adult PT Treatment/Exercise - 05/18/21 0001       Shoulder Exercises: Prone   Extension Strengthening;Left;10 reps;Weights    Extension Weight (lbs) 3    Horizontal ABduction 1 AROM;Strengthening;Left;10 reps   3 sec hold   Other Prone Exercises row x 10 reps 3#      Shoulder Exercises: Sidelying   External Rotation Strengthening;Left;10 reps;Weights    External Rotation Weight (lbs) 2    ABduction Limitations activation of deltoid      Shoulder Exercises: Standing   Flexion Limitations wall slide into end range flexion 15-20 sec hold x 3 reps    Extension Strengthening;Both;10 reps    Theraband Level (Shoulder Extension) Level  4 (Blue)    Row Strengthening;Both;10 reps;Theraband    Theraband Level (Shoulder Row) Level 4 (Blue)    Row Limitations bow and arrow blue TB x 10 each UE    Other Standing Exercises wall push up x 10 3 sec hold      Shoulder Exercises: Pulleys   Flexion --   10 reps 10 sec hold   Scaption --   10 reps 10 sec hold     Shoulder Exercises: ROM/Strengthening   UBE (Upper Arm Bike) L2 x 4 min alternating fwd/back each minute    Other ROM/Strengthening Exercises lat pull bue TB x 10 reps      Vasopneumatic   Number Minutes Vasopneumatic  10 minutes    Vasopnuematic Location  Shoulder   Lt   Vasopneumatic Pressure Medium    Vasopneumatic Temperature  34      Manual Therapy   Soft tissue mobilization anterior Lt shoulder/pecs; anterior deltoid; biceps    Passive ROM Lt shoulder flexion; scaption; ER/IR in scapular plane; extension with elbow flexed and elbow extended                    PT Education - 05/18/21 1215     Education Details HEP    Person(s) Educated Patient    Methods Explanation;Demonstration;Tactile  cues;Verbal cues;Handout    Comprehension Verbalized understanding;Returned demonstration;Verbal cues required;Tactile cues required              PT Short Term Goals - 05/11/21 1232       PT SHORT TERM GOAL #1   Title Instruct patient in initial shoulder exercises and postural correction    Time 6    Status Achieved    Target Date 05/10/21      PT SHORT TERM GOAL #2   Title PROM flexion 140 deg; ER at side 40 deg; abduction 60 deg    Time 6    Period Weeks    Status Achieved    Target Date 05/10/21      PT SHORT TERM GOAL #3   Title Improve posture and alignment with patient to demonstrate increased upright posture with posterior shoulder girdle musculature engaged    Time 6    Period Weeks    Status Achieved    Target Date 05/10/21      PT SHORT TERM GOAL #4   Title Patient to report sleeping in bed instead of recliner    Time 6    Period Weeks    Status Achieved    Target Date 05/10/21               PT Long Term Goals - 05/11/21 1232       PT LONG TERM GOAL #1   Title Increase AROM Lt shoulder to within 5-10 deg of AROM Rt shoulder    Time 12    Period Weeks    Status On-going      PT LONG TERM GOAL #2   Title Increase strength Lt shoulder to 4/5 to 4+/5 throughout    Time 12    Period Weeks    Status On-going      PT LONG TERM GOAL #3   Title Begin functional activities including ADL's to return patient to independent ADL's and return to work    Time 12    Period Weeks    Status Partially Met      PT LONG TERM GOAL #4   Title Independent in HEP      Time 12    Period Weeks    Status On-going      PT LONG TERM GOAL #5   Title Improve functional limitation score to 63    Time 12    Period Weeks    Status On-going                   Plan - 05/18/21 1155     Clinical Impression Statement Continued progression of strengthening and stabilization exercises. Tolerated additional exercises well. Progressing well with shoulder rehab.     Rehab Potential Good    PT Frequency 2x / week    PT Duration 12 weeks    PT Treatment/Interventions ADLs/Self Care Home Management;Aquatic Therapy;Cryotherapy;Electrical Stimulation;Iontophoresis 4mg/ml Dexamethasone;Moist Heat;Ultrasound;Therapeutic activities;Therapeutic exercise;Balance training;Neuromuscular re-education;Patient/family education;Manual techniques;Passive range of motion;Dry needling;Taping;Vasopneumatic Device    PT Next Visit Plan manual work and PROM/stretching; progress exercises per protocol; progress  with "light strengthening" progress TB exercises - begins next phase 8/16    PT Home Exercise Plan [email or web address]    Consulted and Agree with Plan of Care Patient             Patient will benefit from skilled therapeutic intervention in order to improve the following deficits and impairments:     Visit Diagnosis: Status post left rotator cuff repair  Abnormal posture  Other symptoms and signs involving the musculoskeletal system  Muscle weakness (generalized)     Problem List Patient Active Problem List   Diagnosis Date Noted   Rotator cuff tear, left 01/18/2021   Snoring 09/27/2020   Venous stasis 06/02/2020   IFG (impaired fasting glucose) 05/28/2019   Hiatal hernia 05/28/2019   BPPV (benign paroxysmal positional vertigo), left 11/26/2018   Allergic rhinitis due to pollen 11/26/2018   Lymphocytic colitis 01/15/2018   Right lumbar radiculopathy 09/12/2016   Hepatic cyst 11/11/2014   Cancer of skin, squamous cell 03/11/2014   Seborrheic dermatitis 09/05/2012   SCC (squamous cell carcinoma), leg 08/29/2011   Left cervical radiculopathy 01/02/2011   POSTURAL LIGHTHEADEDNESS 12/02/2010   CIGARETTE SMOKER 11/29/2010   Fatty liver 10/08/2009   CONSTIPATION, DRUG INDUCED 10/05/2009   DIVERTICULOSIS OF COLON 08/13/2009   DISC DISEASE, LUMBAR 07/08/2007   DISC DISEASE, CERVICAL 03/11/2007   BPH (benign prostatic hyperplasia) 02/13/2007    Hyperlipidemia 01/16/2007   ANKYLOSING SPONDYLITIS 12/05/2006   Essential hypertension, benign 12/05/2006    Celyn P Holt PT, MPH  05/18/2021, 12:35 PM  [facility] [address] [place], [place], [address] Phone: [phone]   Fax:  [phone]  Name: Raymond Mitchell MRN: [id number] Date of Birth: 09/27/1956    

## 2021-05-18 NOTE — Patient Instructions (Signed)
Access Code: [email or web address] URL: [email or web address] Date: 05/18/2021 Prepared by: Gillermo Murdoch  Exercises Seated Cervical Retraction - 3 x daily - 7 x weekly - 10 reps - 1 sets Standing Scapular Retraction - 3 x daily - 7 x weekly - 10 reps - 1 sets - 10 hold Seated Shoulder Flexion Towel Slide at Table Top Full Range of Motion - 2 x daily - 7 x weekly - 1 sets - 5-10 reps - 10sec hold Circular Shoulder Pendulum with Table Support - 3-4 x daily - 7 x weekly - 1 sets - 20-30 reps Seated Shoulder External Rotation AAROM with Cane and Hand in Neutral - 2 x daily - 7 x weekly - 1 sets - 5-10 reps - 5-10 sec hold Standing Shoulder and Trunk Flexion at Table - 2 x daily - 7 x weekly - 1 sets - 5 reps - 5 sec hold Seated Shoulder Flexion AAROM with Pulley Behind - 2 x daily - 7 x weekly - 1 sets - 10 reps - 10 sec hold Standing Isometric Shoulder Extension with Doorway - Arm Bent - 2 x daily - 7 x weekly - 1 sets - 5-10 reps - 5 sec hold Standing Isometric Shoulder External Rotation with Doorway - 2 x daily - 7 x weekly - 1 sets - 5-10 reps - 5 sec hold Standing Isometric Shoulder Internal Rotation with Towel Roll at Doorway - 2 x daily - 7 x weekly - 1 sets - 5 reps - 5 sec hold Standing Isometric Shoulder Internal Rotation with Towel Roll at Doorway - 2 x daily - 7 x weekly - 1 sets - 5 reps - 5 sec hold Standing Isometric Shoulder Flexion with Doorway - Arm Bent - 2 x daily - 7 x weekly - 1 sets - 5 reps - 5 sec hold Supine Shoulder Flexion Extension AAROM with Dowel - 2 x daily - 7 x weekly - 1 sets - 5-10 reps - 3 sec hold Standing Shoulder Extension with Dowel - 2 x daily - 7 x weekly - 1 sets - 5-10 reps - 3 seconds hold Shoulder External Rotation Reactive Isometrics - 2 x daily - 7 x weekly - 1 sets - 10 reps - 3-5 sec hold Shoulder Internal Rotation Reactive Isometrics - 2 x daily - 7 x weekly - 1 sets - 10 reps - 3-5 sec hold Standing Shoulder Flexion Reactive Isometric - 2 x daily  - 7 x weekly - 1 sets - 10 reps - 3-5 sec hold Sidelying Shoulder External Rotation - 2 x daily - 7 x weekly - 1 sets - 10 reps - 3 sec hold Scapular Retraction with Resistance - 2 x daily - 7 x weekly - 10 reps - 3 sets Scapular Retraction with Resistance Advanced - 2 x daily - 7 x weekly - 10 reps - 3 sets Supine Shoulder Rhythmic Stabilization- Horizontal Abduction/Adduction - 2 x daily - 7 x weekly - 1 sets - 5-10 reps Standing Shoulder Internal Rotation Stretch with Towel - 2 x daily - 7 x weekly - 1 sets - 3-5 reps - 15-20 sec hold Bicep Stretch at Table - 2 x daily - 7 x weekly - 1 sets - 3 reps - 30 sec hold Isometric Shoulder External Rotation with Ball at [employer] - 7 x weekly Standing Wall [employer] with [employer] Push Up - 2 x daily - 7 x weekly - 1 sets - 10 reps - 3 sec hold Standing Lat  Pull Down with Resistance - Elbows Bent - 2 x daily - 7 x weekly - 1 sets - 10 reps - 3 sec hold

## 2021-05-20 ENCOUNTER — Encounter: Payer: Self-pay | Admitting: Rehabilitative and Restorative Service Providers"

## 2021-05-20 ENCOUNTER — Ambulatory Visit (INDEPENDENT_AMBULATORY_CARE_PROVIDER_SITE_OTHER): Payer: BC Managed Care – PPO | Admitting: Rehabilitative and Restorative Service Providers"

## 2021-05-20 ENCOUNTER — Other Ambulatory Visit: Payer: Self-pay

## 2021-05-20 DIAGNOSIS — R293 Abnormal posture: Secondary | ICD-10-CM | POA: Diagnosis not present

## 2021-05-20 DIAGNOSIS — M6281 Muscle weakness (generalized): Secondary | ICD-10-CM

## 2021-05-20 DIAGNOSIS — Z9889 Other specified postprocedural states: Secondary | ICD-10-CM

## 2021-05-20 DIAGNOSIS — R29898 Other symptoms and signs involving the musculoskeletal system: Secondary | ICD-10-CM

## 2021-05-20 NOTE — Therapy (Signed)
[place], [place], [address] Phone: [phone]   Fax:  [phone]  Physical Therapy Treatment  Patient Details  Name: Raymond Mitchell MRN: [id number] Date of Birth: 1956-08-29 Referring Provider (PT): Dr Ophelia Charter   Encounter Date: 05/20/2021   PT End of Session - 05/20/21 1150     Visit Number 14    Number of Visits 24    Date for PT Re-Evaluation 06/21/21    PT Start Time 2876    PT Stop Time 1233    PT Time Calculation (min) 48 min    Activity Tolerance Patient tolerated treatment well             Past Medical History:  Diagnosis Date   Ankylosing spondylitis ([place])    BPH (benign prostatic hyperplasia)    hx of   Diverticulosis    Lymphocytic colitis 01/15/2018   Pes planus    Right inguinal hernia    Tobacco user    Varicose veins     Past Surgical History:  Procedure Laterality Date   BACK SURGERY  12/2007   L 4 to L 5 fusion   BACK SURGERY  2012   disc repair   c spine ESI     [name]   INGUINAL HERNIA REPAIR  1994   INGUINAL HERNIA REPAIR Right 07/16/2020   Procedure: LAPAROSCOPIC RIGHT INGUINAL HERNIA WITH MESH;  Surgeon: [name], [name], MD;  Location: [facility];  Service: General;  Laterality: Right;   RTC surgery Left 2005    There were no vitals filed for this visit.   Subjective Assessment - 05/20/21 1150     Subjective Has not had a chance to do new strengthening exercises since last visit. Still doing well with exercises and rehab    Currently in Pain? No/denies    Pain Score 0-No pain                [facility] PT Assessment - 05/20/21 0001       Assessment   Medical Diagnosis Lt RCR    Referring Provider (PT) Dr Ophelia Charter    Onset Date/Surgical Date 02/21/21    Hand Dominance Right    Next MD Visit 05/24/21    Prior Therapy for LB and Lt shoulder      PROM   Left Shoulder Extension 70 Degrees    Left Shoulder  Flexion 161 Degrees    Left Shoulder ABduction 164 Degrees   in scapular plane   Left Shoulder External Rotation 72 Degrees   in scapular plane     Palpation   Palpation comment minimal muscular tightness Lt shoulder girdle - pecs, upper traps, teres, biceps, anterior deltoid                           [facility] Adult PT Treatment/Exercise - 05/20/21 0001       Shoulder Exercises: Prone   Extension Strengthening;Left;10 reps;Weights    Extension Weight (lbs) 3    Horizontal ABduction 1 AROM;Strengthening;Left;10 reps   3 sec hold   Other Prone Exercises row x 10 reps 3#      Shoulder Exercises: Sidelying   External Rotation Strengthening;Left;10 reps;Weights    External Rotation Weight (lbs) 2    ABduction Limitations activation of deltoid      Shoulder Exercises: Standing   Extension Strengthening;Both;10 reps    Theraband Level (Shoulder Extension) Level 4 (  Blue)    Row Strengthening;Both;10 reps;Theraband    Theraband Level (Shoulder Row) Level 4 (Blue)    Row Limitations bow and arrow blue TB x 10 each UE      Shoulder Exercises: Pulleys   Flexion --   10 reps 10 sec hold   Scaption --   10 reps 10 sec hold     Shoulder Exercises: ROM/Strengthening   UBE (Upper Arm Bike) L2 x 4 min alternating fwd/back each minute    Wall Pushups 10 reps   3 sec hold   Other ROM/Strengthening Exercises lat pull bue TB x 10 reps      Shoulder Exercises: Stretch   Corner Stretch 3 reps;20 seconds    Corner Stretch Limitations mid position elbows even with shoulders    Internal Rotation Stretch 5 reps   10 sec hold   Other Shoulder Stretches Lt bicep stretch x 10sec x 3 reps      Vasopneumatic   Number Minutes Vasopneumatic  10 minutes    Vasopnuematic Location  Shoulder   Lt   Vasopneumatic Pressure Medium    Vasopneumatic Temperature  34      Manual Therapy   Soft tissue mobilization anterior Lt shoulder/pecs; anterior deltoid; biceps    Passive ROM Lt shoulder  flexion; scaption; ER/IR in scapular plane; extension with elbow flexed and elbow extended                      PT Short Term Goals - 05/11/21 1232       PT SHORT TERM GOAL #1   Title Instruct patient in initial shoulder exercises and postural correction    Time 6    Status Achieved    Target Date 05/10/21      PT SHORT TERM GOAL #2   Title PROM flexion 140 deg; ER at side 40 deg; abduction 60 deg    Time 6    Period Weeks    Status Achieved    Target Date 05/10/21      PT SHORT TERM GOAL #3   Title Improve posture and alignment with patient to demonstrate increased upright posture with posterior shoulder girdle musculature engaged    Time 6    Period Weeks    Status Achieved    Target Date 05/10/21      PT SHORT TERM GOAL #4   Title Patient to report sleeping in bed instead of recliner    Time 6    Period Weeks    Status Achieved    Target Date 05/10/21               PT Long Term Goals - 05/11/21 1232       PT LONG TERM GOAL #1   Title Increase AROM Lt shoulder to within 5-10 deg of AROM Rt shoulder    Time 12    Period Weeks    Status On-going      PT LONG TERM GOAL #2   Title Increase strength Lt shoulder to 4/5 to 4+/5 throughout    Time 12    Period Weeks    Status On-going      PT LONG TERM GOAL #3   Title Begin functional activities including ADL's to return patient to independent ADL's and return to work    Time 12    Period Weeks    Status Partially Met      PT LONG TERM GOAL #4   Title Independent in HEP  Time 12    Period Weeks    Status On-going      PT LONG TERM GOAL #5   Title Improve functional limitation score to 63    Time 12    Period Weeks    Status On-going                   Plan - 05/20/21 1226     Clinical Impression Statement Excellent progres with Lt shoulder rehab. Good gains in ROM, strength and scapular stability. Progressing well toward stated goals of therapy within protocol guidelines.     Rehab Potential Good    PT Frequency 2x / week    PT Duration 12 weeks    PT Treatment/Interventions ADLs/Self Care Home Management;Aquatic Therapy;Cryotherapy;Electrical Stimulation;Iontophoresis 4mg /ml Dexamethasone;Moist Heat;Ultrasound;Therapeutic activities;Therapeutic exercise;Balance training;Neuromuscular re-education;Patient/family education;Manual techniques;Passive range of motion;Dry needling;Taping;Vasopneumatic Device    PT Next Visit Plan manual work and PROM/stretching; progress exercises per protocol; progress with strengthening with TB and weights Note to MD    PT Home Exercise Plan [email or web address]    Consulted and Agree with Plan of Care Patient             Patient will benefit from skilled therapeutic intervention in order to improve the following deficits and impairments:     Visit Diagnosis: Status post left rotator cuff repair  Abnormal posture  Other symptoms and signs involving the musculoskeletal system  Muscle weakness (generalized)     Problem List Patient Active Problem List   Diagnosis Date Noted   Rotator cuff tear, left 01/18/2021   Snoring 09/27/2020   Venous stasis 06/02/2020   IFG (impaired fasting glucose) 05/28/2019   Hiatal hernia 05/28/2019   BPPV (benign paroxysmal positional vertigo), left 11/26/2018   Allergic rhinitis due to pollen 11/26/2018   Lymphocytic colitis 01/15/2018   Right lumbar radiculopathy 09/12/2016   Hepatic cyst 11/11/2014   Cancer of skin, squamous cell 03/11/2014   Seborrheic dermatitis 09/05/2012   SCC (squamous cell carcinoma), leg 08/29/2011   Left cervical radiculopathy 01/02/2011   POSTURAL LIGHTHEADEDNESS 12/02/2010   CIGARETTE SMOKER 11/29/2010   Fatty liver 10/08/2009   CONSTIPATION, DRUG INDUCED 10/05/2009   DIVERTICULOSIS OF COLON 08/13/2009   [place] DISEASE, LUMBAR 07/08/2007   [place] DISEASE, CERVICAL 03/11/2007   BPH (benign prostatic hyperplasia) 02/13/2007   Hyperlipidemia 01/16/2007    ANKYLOSING SPONDYLITIS 12/05/2006   Essential hypertension, benign 12/05/2006    Marquesa Rath Nilda Simmer PT, MPH  05/20/2021, 12:31 PM  [place] [address] [place], [place], [address] Phone: [phone]   Fax:  [phone]  Name: EATON FOLMAR MRN: [id number] Date of Birth: July 03, 1956

## 2021-05-21 ENCOUNTER — Other Ambulatory Visit: Payer: Self-pay | Admitting: Family Medicine

## 2021-05-21 DIAGNOSIS — K5909 Other constipation: Secondary | ICD-10-CM

## 2021-05-23 ENCOUNTER — Encounter: Payer: Self-pay | Admitting: Rehabilitative and Restorative Service Providers"

## 2021-05-23 ENCOUNTER — Other Ambulatory Visit: Payer: Self-pay

## 2021-05-23 ENCOUNTER — Ambulatory Visit (INDEPENDENT_AMBULATORY_CARE_PROVIDER_SITE_OTHER): Payer: BC Managed Care – PPO | Admitting: Rehabilitative and Restorative Service Providers"

## 2021-05-23 DIAGNOSIS — M6281 Muscle weakness (generalized): Secondary | ICD-10-CM

## 2021-05-23 DIAGNOSIS — R293 Abnormal posture: Secondary | ICD-10-CM

## 2021-05-23 DIAGNOSIS — R29898 Other symptoms and signs involving the musculoskeletal system: Secondary | ICD-10-CM

## 2021-05-23 DIAGNOSIS — Z9889 Other specified postprocedural states: Secondary | ICD-10-CM | POA: Diagnosis not present

## 2021-05-23 NOTE — Therapy (Signed)
[facility] [place], [place], [address] Phone: [phone]   Fax:  [phone]  Physical Therapy Treatment  Patient Details  Name: Raymond Mitchell MRN: [id number] Date of Birth: 06-Oct-1955 Referring Provider (PT): Dr Ophelia Charter   Encounter Date: 05/23/2021   PT End of Session - 05/23/21 1148     Visit Number 15    Number of Visits 24    Date for PT Re-Evaluation 06/21/21    PT Start Time 1975    PT Stop Time 1233    PT Time Calculation (min) 48 min    Activity Tolerance Patient tolerated treatment well             Past Medical History:  Diagnosis Date   Ankylosing spondylitis ([place])    BPH (benign prostatic hyperplasia)    hx of   Diverticulosis    Lymphocytic colitis 01/15/2018   Pes planus    Right inguinal hernia    Tobacco user    Varicose veins     Past Surgical History:  Procedure Laterality Date   BACK SURGERY  12/2007   L 4 to L 5 fusion   BACK SURGERY  2012   disc repair   c spine ESI     [name]   INGUINAL HERNIA REPAIR  1994   INGUINAL HERNIA REPAIR Right 07/16/2020   Procedure: LAPAROSCOPIC RIGHT INGUINAL HERNIA WITH MESH;  Surgeon: [name], [name], MD;  Location: [facility];  Service: General;  Laterality: Right;   RTC surgery Left 2005    There were no vitals filed for this visit.   Subjective Assessment - 05/23/21 1150     Subjective No problem with HEP. Added new TB exercises without difficulty.    Currently in Pain? No/denies    Pain Score 0-No pain                               [facility] Adult PT Treatment/Exercise - 05/23/21 0001       Shoulder Exercises: Pulleys   Flexion --   5 reps 10 sec hold   Scaption --   5 reps 10 sec hold     Shoulder Exercises: Therapy Ball   Flexion Right;Left;10 reps    Flexion Limitations pause to stretch into flexion    Other Therapy Ball Exercises bouncing ball on wall overhead x  1-2 min; on floor x 1-2 min      Shoulder Exercises: ROM/Strengthening   UBE (Upper Arm Bike) L5 x 4 min alternating fwd/back each minute    Wall Wash 1 min x 3 reps    Wall Pushups 10 reps   3 sec hold   Pushups 10 reps    Pushups Limitations push up clap/catch x 10 x 2 sets      Shoulder Exercises: Stretch   Corner Stretch 3 reps;20 seconds    Corner Stretch Limitations mid position elbows even with shoulders    Internal Rotation Stretch 5 reps   10 sec hold   Other Shoulder Stretches Lt bicep stretch x 20 sec x 3 reps      Shoulder Exercises: Body Blade   Flexion 30 seconds;3 reps    Flexion Limitations Lt/Rt      Vasopneumatic   Number Minutes Vasopneumatic  10 minutes    Vasopnuematic Location  Shoulder   Lt   Vasopneumatic Pressure Medium    Vasopneumatic Temperature  34      Manual Therapy   Soft tissue mobilization anterior Lt shoulder/pecs; anterior deltoid; biceps    Passive ROM Lt shoulder flexion; scaption; ER/IR in scapular plane; extension with elbow flexed and elbow extended                      PT Short Term Goals - 05/11/21 1232       PT SHORT TERM GOAL #1   Title Instruct patient in initial shoulder exercises and postural correction    Time 6    Status Achieved    Target Date 05/10/21      PT SHORT TERM GOAL #2   Title PROM flexion 140 deg; ER at side 40 deg; abduction 60 deg    Time 6    Period Weeks    Status Achieved    Target Date 05/10/21      PT SHORT TERM GOAL #3   Title Improve posture and alignment with patient to demonstrate increased upright posture with posterior shoulder girdle musculature engaged    Time 6    Period Weeks    Status Achieved    Target Date 05/10/21      PT SHORT TERM GOAL #4   Title Patient to report sleeping in bed instead of recliner    Time 6    Period Weeks    Status Achieved    Target Date 05/10/21               PT Long Term Goals - 05/11/21 1232       PT LONG TERM GOAL #1   Title  Increase AROM Lt shoulder to within 5-10 deg of AROM Rt shoulder    Time 12    Period Weeks    Status On-going      PT LONG TERM GOAL #2   Title Increase strength Lt shoulder to 4/5 to 4+/5 throughout    Time 12    Period Weeks    Status On-going      PT LONG TERM GOAL #3   Title Begin functional activities including ADL's to return patient to independent ADL's and return to work    Time 12    Period Weeks    Status Partially Met      PT LONG TERM GOAL #4   Title Independent in HEP    Time 12    Period Weeks    Status On-going      PT LONG TERM GOAL #5   Title Improve functional limitation score to 63    Time 12    Period Weeks    Status On-going                   Plan - 05/23/21 1150     Clinical Impression Statement Continues to progress well with shoulder rehab. Working on end range mobility/ROM and strengthening.    Rehab Potential Good    PT Frequency 2x / week    PT Duration 12 weeks    PT Treatment/Interventions ADLs/Self Care Home Management;Aquatic Therapy;Cryotherapy;Electrical Stimulation;Iontophoresis 4mg /ml Dexamethasone;Moist Heat;Ultrasound;Therapeutic activities;Therapeutic exercise;Balance training;Neuromuscular re-education;Patient/family education;Manual techniques;Passive range of motion;Dry needling;Taping;Vasopneumatic Device    PT Next Visit Plan manual work and PROM/stretching; progress exercises per protocol; progress with strengthening with TB and weights Note sent to MD for appt tomorrow    [place]    Consulted and Agree with Plan of Care Patient  Patient will benefit from skilled therapeutic intervention in order to improve the following deficits and impairments:     Visit Diagnosis: Status post left rotator cuff repair  Abnormal posture  Other symptoms and signs involving the musculoskeletal system  Muscle weakness (generalized)     Problem List Patient Active Problem List    Diagnosis Date Noted   Rotator cuff tear, left 01/18/2021   Snoring 09/27/2020   Venous stasis 06/02/2020   IFG (impaired fasting glucose) 05/28/2019   Hiatal hernia 05/28/2019   BPPV (benign paroxysmal positional vertigo), left 11/26/2018   Allergic rhinitis due to pollen 11/26/2018   Lymphocytic colitis 01/15/2018   Right lumbar radiculopathy 09/12/2016   Hepatic cyst 11/11/2014   Cancer of skin, squamous cell 03/11/2014   Seborrheic dermatitis 09/05/2012   SCC (squamous cell carcinoma), leg 08/29/2011   Left cervical radiculopathy 01/02/2011   POSTURAL LIGHTHEADEDNESS 12/02/2010   CIGARETTE SMOKER 11/29/2010   Fatty liver 10/08/2009   CONSTIPATION, DRUG INDUCED 10/05/2009   DIVERTICULOSIS OF COLON 08/13/2009   [place] DISEASE, LUMBAR 07/08/2007   [place] DISEASE, CERVICAL 03/11/2007   BPH (benign prostatic hyperplasia) 02/13/2007   Hyperlipidemia 01/16/2007   ANKYLOSING SPONDYLITIS 12/05/2006   Essential hypertension, benign 12/05/2006    Mckay Tegtmeyer Nilda Simmer PT, MPH  05/23/2021, 12:28 PM  [facility] [place] [address] [place], [place], [address] Phone: [phone]   Fax:  [phone]  Name: MAXTYN NUZUM MRN: [id number] Date of Birth: 1956-09-10

## 2021-05-24 ENCOUNTER — Encounter: Payer: BC Managed Care – PPO | Admitting: Rehabilitative and Restorative Service Providers"

## 2021-05-25 ENCOUNTER — Ambulatory Visit (INDEPENDENT_AMBULATORY_CARE_PROVIDER_SITE_OTHER): Payer: BC Managed Care – PPO | Admitting: Physical Therapy

## 2021-05-25 ENCOUNTER — Encounter: Payer: Self-pay | Admitting: Physical Therapy

## 2021-05-25 ENCOUNTER — Other Ambulatory Visit: Payer: Self-pay

## 2021-05-25 DIAGNOSIS — R29898 Other symptoms and signs involving the musculoskeletal system: Secondary | ICD-10-CM

## 2021-05-25 DIAGNOSIS — Z9889 Other specified postprocedural states: Secondary | ICD-10-CM

## 2021-05-25 DIAGNOSIS — M6281 Muscle weakness (generalized): Secondary | ICD-10-CM | POA: Diagnosis not present

## 2021-05-25 DIAGNOSIS — R293 Abnormal posture: Secondary | ICD-10-CM | POA: Diagnosis not present

## 2021-05-25 NOTE — Therapy (Signed)
[place], [place], [address] Phone: [phone]   Fax:  [phone]  Physical Therapy Treatment  Patient Details  Name: Raymond Mitchell MRN: [id number] Date of Birth: 1956-03-06 Referring Provider (PT): Dr Ophelia Charter   Encounter Date: 05/25/2021   PT End of Session - 05/25/21 1210     Visit Number 16    Number of Visits 24    Date for PT Re-Evaluation 06/21/21    PT Start Time 1148    PT Stop Time 1230    PT Time Calculation (min) 42 min    Activity Tolerance Patient tolerated treatment well    Behavior During Therapy [facility] for tasks assessed/performed             Past Medical History:  Diagnosis Date   Ankylosing spondylitis ([place])    BPH (benign prostatic hyperplasia)    hx of   Diverticulosis    Lymphocytic colitis 01/15/2018   Pes planus    Right inguinal hernia    Tobacco user    Varicose veins     Past Surgical History:  Procedure Laterality Date   BACK SURGERY  12/2007   L 4 to L 5 fusion   BACK SURGERY  2012   disc repair   c spine ESI     [name]   INGUINAL HERNIA REPAIR  1994   INGUINAL HERNIA REPAIR Right 07/16/2020   Procedure: LAPAROSCOPIC RIGHT INGUINAL HERNIA WITH MESH;  Surgeon: [name], [name], MD;  Location: [place];  Service: General;  Laterality: Right;   RTC surgery Left 2005    There were no vitals filed for this visit.   Subjective Assessment - 05/25/21 1153     Subjective Pt reports he has been released by the surgeon.  Restrictions lifted, "just use common sense". He was given a progression for return to golf. He has laid on his Lt side for a little bit at night with improved tolerance, per report.    Pertinent History Lt shoulder sx 2005; Lumbar surgeries x 2; HNP cervical; HTN    Currently in Pain? No/denies    Pain Score 0-No pain                [facility] PT Assessment - 05/25/21 0001       Assessment   Medical  Diagnosis Lt RCR    Referring Provider (PT) Dr Ophelia Charter    Onset Date/Surgical Date 02/21/21    Hand Dominance Right    Next MD Visit PRN    Prior Therapy for LB and Lt shoulder      Observation/Other Assessments   Focus on Therapeutic Outcomes (FOTO)  functional score 70               [facility] Adult PT Treatment/Exercise - 05/25/21 0001       Elbow Exercises   Elbow Flexion Strengthening;Both;10 reps;Standing   5# in each hand.     Shoulder Exercises: Standing   Other Standing Exercises simulated golf swing with SPC x 4 reps; pt reports tightness along posterior deltoid area.  reviewed pt's current golf stretches (visually/verbally) - recommended L stretch vs touching toes for LE/back stretch.    Other Standing Exercises bicep curl to overhead press with 2# x 8 reps with mirror for visual feedback      Shoulder Exercises: ROM/Strengthening   UBE (Upper Arm Bike) L5 x 1.5 min each direction    Wall Pushups 10  reps    Wall Pushups Limitations and reverse wall push ups x 10      Shoulder Exercises: Stretch   Corner Stretch 2 reps;20 seconds   3 positions   Cross Chest Stretch 3 reps;20 seconds    Other Shoulder Stretches Lt bicep stretch x 20 sec x 3 reps;  sidelying open book x 5 reps each side.    Other Shoulder Stretches trial of doorway stretch in 3 positions, then overhead stretch x 10 sec x 2.      Shoulder Exercises: Body Blade   Flexion 30 seconds;2 reps   Lt   ABduction 30 seconds;1 rep   Lt, scpation     Vasopneumatic   Number Minutes Vasopneumatic  10 minutes    Vasopnuematic Location  Shoulder   Lt   Vasopneumatic Pressure Medium    Vasopneumatic Temperature  34                      PT Short Term Goals - 05/11/21 1232       PT SHORT TERM GOAL #1   Title Instruct patient in initial shoulder exercises and postural correction    Time 6    Status Achieved    Target Date 05/10/21      PT SHORT TERM GOAL #2   Title PROM flexion 140 deg; ER at  side 40 deg; abduction 60 deg    Time 6    Period Weeks    Status Achieved    Target Date 05/10/21      PT SHORT TERM GOAL #3   Title Improve posture and alignment with patient to demonstrate increased upright posture with posterior shoulder girdle musculature engaged    Time 6    Period Weeks    Status Achieved    Target Date 05/10/21      PT SHORT TERM GOAL #4   Title Patient to report sleeping in bed instead of recliner    Time 6    Period Weeks    Status Achieved    Target Date 05/10/21               PT Long Term Goals - 05/25/21 1236       PT LONG TERM GOAL #1   Title Increase AROM Lt shoulder to within 5-10 deg of AROM Rt shoulder    Time 12    Period Weeks    Status Partially Met      PT LONG TERM GOAL #2   Title Increase strength Lt shoulder to 4/5 to 4+/5 throughout    Time 12    Period Weeks    Status On-going      PT LONG TERM GOAL #3   Title Begin functional activities including ADL's to return patient to independent ADL's and return to work    Time 12    Period Weeks    Status Partially Met      PT LONG TERM GOAL #4   Title Independent in HEP    Time 12    Period Weeks    Status On-going      PT LONG TERM GOAL #5   Title Improve functional limitation score to 63    Time 12    Period Weeks    Status Achieved                   Plan - 05/25/21 1253     Clinical Impression Statement Trial additional variations of stretches  for Lt shoulder; all tolerated well.  Pt's Lt shoulder fatigued quickly with body blade today. He has met his FOTO goal. Pt will benefit from continued work on end range mobility and strengthening of LUE to assist with return to sport (working out/ golf) and functional activities.    Rehab Potential Good    PT Frequency 2x / week    PT Duration 12 weeks    PT Treatment/Interventions ADLs/Self Care Home Management;Aquatic Therapy;Cryotherapy;Electrical Stimulation;Iontophoresis 4mg /ml Dexamethasone;Moist  Heat;Ultrasound;Therapeutic activities;Therapeutic exercise;Balance training;Neuromuscular re-education;Patient/family education;Manual techniques;Passive range of motion;Dry needling;Taping;Vasopneumatic Device    PT Next Visit Plan manual work and PROM/stretching; progress exercises per protocol; progress with strengthening with TB and weights    PT Home Exercise Plan [email or web address]    Consulted and Agree with Plan of Care Patient             Patient will benefit from skilled therapeutic intervention in order to improve the following deficits and impairments:  Decreased range of motion, Impaired UE functional use, Decreased activity tolerance, Pain, Hypomobility, Impaired flexibility, Improper body mechanics, Other (comment), Decreased mobility, Decreased strength, Increased edema, Postural dysfunction  Visit Diagnosis: Status post left rotator cuff repair  Abnormal posture  Other symptoms and signs involving the musculoskeletal system  Muscle weakness (generalized)     Problem List Patient Active Problem List   Diagnosis Date Noted   Rotator cuff tear, left 01/18/2021   Snoring 09/27/2020   Venous stasis 06/02/2020   IFG (impaired fasting glucose) 05/28/2019   Hiatal hernia 05/28/2019   BPPV (benign paroxysmal positional vertigo), left 11/26/2018   Allergic rhinitis due to pollen 11/26/2018   Lymphocytic colitis 01/15/2018   Right lumbar radiculopathy 09/12/2016   Hepatic cyst 11/11/2014   Cancer of skin, squamous cell 03/11/2014   Seborrheic dermatitis 09/05/2012   SCC (squamous cell carcinoma), leg 08/29/2011   Left cervical radiculopathy 01/02/2011   POSTURAL LIGHTHEADEDNESS 12/02/2010   CIGARETTE SMOKER 11/29/2010   Fatty liver 10/08/2009   CONSTIPATION, DRUG INDUCED 10/05/2009   DIVERTICULOSIS OF COLON 08/13/2009   [place] DISEASE, LUMBAR 07/08/2007   [place] DISEASE, CERVICAL 03/11/2007   BPH (benign prostatic hyperplasia) 02/13/2007   Hyperlipidemia 01/16/2007    ANKYLOSING SPONDYLITIS 12/05/2006   Essential hypertension, benign 12/05/2006   Kerin Perna, PTA 05/25/21 12:59 PM  [facility] [address] [place], [place], [address] Phone: [phone]   Fax:  [phone]  Name: WENDLE KINA MRN: [id number] Date of Birth: August 15, 1956

## 2021-05-31 ENCOUNTER — Encounter: Payer: Self-pay | Admitting: Rehabilitative and Restorative Service Providers"

## 2021-05-31 ENCOUNTER — Ambulatory Visit (INDEPENDENT_AMBULATORY_CARE_PROVIDER_SITE_OTHER): Payer: BC Managed Care – PPO | Admitting: Rehabilitative and Restorative Service Providers"

## 2021-05-31 ENCOUNTER — Other Ambulatory Visit: Payer: Self-pay

## 2021-05-31 DIAGNOSIS — Z9889 Other specified postprocedural states: Secondary | ICD-10-CM

## 2021-05-31 DIAGNOSIS — R29898 Other symptoms and signs involving the musculoskeletal system: Secondary | ICD-10-CM | POA: Diagnosis not present

## 2021-05-31 DIAGNOSIS — L249 Irritant contact dermatitis, unspecified cause: Secondary | ICD-10-CM | POA: Diagnosis not present

## 2021-05-31 DIAGNOSIS — R293 Abnormal posture: Secondary | ICD-10-CM

## 2021-05-31 DIAGNOSIS — M6281 Muscle weakness (generalized): Secondary | ICD-10-CM | POA: Diagnosis not present

## 2021-05-31 DIAGNOSIS — L820 Inflamed seborrheic keratosis: Secondary | ICD-10-CM | POA: Diagnosis not present

## 2021-05-31 DIAGNOSIS — Z85828 Personal history of other malignant neoplasm of skin: Secondary | ICD-10-CM | POA: Diagnosis not present

## 2021-05-31 DIAGNOSIS — L821 Other seborrheic keratosis: Secondary | ICD-10-CM | POA: Diagnosis not present

## 2021-05-31 NOTE — Therapy (Signed)
[place], [place], [address] Phone: [phone]   Fax:  [phone]  Physical Therapy Treatment  Patient Details  Name: Raymond Mitchell MRN: [id number] Date of Birth: 11/11/1955 Referring Provider (PT): Dr Ophelia Charter   Encounter Date: 05/31/2021   PT End of Session - 05/31/21 1149     Visit Number 17    Number of Visits 24    Date for PT Re-Evaluation 06/21/21    PT Start Time 1147    PT Stop Time 1233    PT Time Calculation (min) 46 min    Activity Tolerance Patient tolerated treatment well             Past Medical History:  Diagnosis Date   Ankylosing spondylitis ([place])    BPH (benign prostatic hyperplasia)    hx of   Diverticulosis    Lymphocytic colitis 01/15/2018   Pes planus    Right inguinal hernia    Tobacco user    Varicose veins     Past Surgical History:  Procedure Laterality Date   BACK SURGERY  12/2007   L 4 to L 5 fusion   BACK SURGERY  2012   disc repair   c spine ESI     [name]   INGUINAL HERNIA REPAIR  1994   INGUINAL HERNIA REPAIR Right 07/16/2020   Procedure: LAPAROSCOPIC RIGHT INGUINAL HERNIA WITH MESH;  Surgeon: [name], [name], MD;  Location: [facility];  Service: General;  Laterality: Right;   RTC surgery Left 2005    There were no vitals filed for this visit.   Subjective Assessment - 05/31/21 1149     Subjective Doing well - adding bow flex exercises at home.    Currently in Pain? No/denies    Pain Score 0-No pain    Pain Location Shoulder                [facility] PT Assessment - 05/31/21 0001       Assessment   Medical Diagnosis Lt RCR    Referring Provider (PT) Dr Ophelia Charter    Onset Date/Surgical Date 02/21/21    Hand Dominance Right    Next MD Visit PRN    Prior Therapy for LB and Lt shoulder      Strength   Overall Strength Comments functional weakness with Lt UE activities      Palpation    Palpation comment minimal muscular tightness Lt shoulder girdle - pecs, upper traps, teres, biceps, anterior deltoid                           [facility] Adult PT Treatment/Exercise - 05/31/21 0001       Therapeutic Activites    Therapeutic Activities Lifting    Lifting 15# KB lifting from 8 inch stool deadlift    Other Therapeutic Activities carry 2# at shoulder height x 80 ft      Shoulder Exercises: Pulleys   Flexion 1 minute    Scaption 1 minute    Other Pulley Exercises IR standing x ~ 5 reps      Shoulder Exercises: Therapy Ball   Flexion Right;Left;10 reps    Flexion Limitations pause to stretch into flexion    Other Therapy Ball Exercises bouncing ball on wall overhead x 1-2 min      Shoulder Exercises: ROM/Strengthening   Wall Pushups 10 reps    Wall Pushups Limitations  and reverse wall push ups x 10    Pushups 10 reps   wall push up   Pushups Limitations push up clap/catch x 10 x 2 sets    Plank 30 seconds;1 rep    Plank Limitations forearms    Other ROM/Strengthening Exercises bamboo stick with 2# wts suspended on red TB both ends for overhead press x 10 reps; biceps curl x 10; walking x 40 ft arms at side      Shoulder Exercises: Stretch   Other Shoulder Stretches doorway stretch in 3 positions, then overhead stretch x 20 sec x 2 reps      Shoulder Exercises: Body Blade   Flexion 30 seconds;2 reps   Lt   Flexion Limitations Lt/Rt    ABduction 30 seconds;1 rep   Lt, scpation     Vasopneumatic   Number Minutes Vasopneumatic  10 minutes    Vasopnuematic Location  Shoulder   Lt   Vasopneumatic Pressure Medium    Vasopneumatic Temperature  34                    PT Education - 05/31/21 1225     Education Details HEP    Person(s) Educated Patient    Methods Explanation;Demonstration;Tactile cues;Verbal cues;Handout    Comprehension Verbalized understanding;Returned demonstration;Verbal cues required;Tactile cues required               PT Short Term Goals - 05/11/21 1232       PT SHORT TERM GOAL #1   Title Instruct patient in initial shoulder exercises and postural correction    Time 6    Status Achieved    Target Date 05/10/21      PT SHORT TERM GOAL #2   Title PROM flexion 140 deg; ER at side 40 deg; abduction 60 deg    Time 6    Period Weeks    Status Achieved    Target Date 05/10/21      PT SHORT TERM GOAL #3   Title Improve posture and alignment with patient to demonstrate increased upright posture with posterior shoulder girdle musculature engaged    Time 6    Period Weeks    Status Achieved    Target Date 05/10/21      PT SHORT TERM GOAL #4   Title Patient to report sleeping in bed instead of recliner    Time 6    Period Weeks    Status Achieved    Target Date 05/10/21               PT Long Term Goals - 05/25/21 1236       PT LONG TERM GOAL #1   Title Increase AROM Lt shoulder to within 5-10 deg of AROM Rt shoulder    Time 12    Period Weeks    Status Partially Met      PT LONG TERM GOAL #2   Title Increase strength Lt shoulder to 4/5 to 4+/5 throughout    Time 12    Period Weeks    Status On-going      PT LONG TERM GOAL #3   Title Begin functional activities including ADL's to return patient to independent ADL's and return to work    Time 12    Period Weeks    Status Partially Met      PT LONG TERM GOAL #4   Title Independent in HEP    Time 12    Period Weeks  Status On-going      PT LONG TERM GOAL #5   Title Improve functional limitation score to 63    Time 12    Period Weeks    Status Achieved                   Plan - 05/31/21 1150     Clinical Impression Statement Continued progress with ROM and strengthening exercises. Patrient has difficulty with sustained activities over 90 deg. Will benefit from continued strengthening activities/exercises. Progressing well toward stated goals of therapy.    Rehab Potential Good    PT Frequency 2x / week     PT Duration 12 weeks    PT Treatment/Interventions ADLs/Self Care Home Management;Aquatic Therapy;Cryotherapy;Electrical Stimulation;Iontophoresis 36m/ml Dexamethasone;Moist Heat;Ultrasound;Therapeutic activities;Therapeutic exercise;Balance training;Neuromuscular re-education;Patient/family education;Manual techniques;Passive range of motion;Dry needling;Taping;Vasopneumatic Device    PT Next Visit Plan manual work and PROM/stretching; progress exercises per protocol; progress with strengthening with TB and weights as well as wt bearing activities.    PT Home Exercise Plan [id number]   Consulted and Agree with Plan of Care Patient             Patient will benefit from skilled therapeutic intervention in order to improve the following deficits and impairments:     Visit Diagnosis: Status post left rotator cuff repair  Abnormal posture  Other symptoms and signs involving the musculoskeletal system  Muscle weakness (generalized)     Problem List Patient Active Problem List   Diagnosis Date Noted   Rotator cuff tear, left 01/18/2021   Snoring 09/27/2020   Venous stasis 06/02/2020   IFG (impaired fasting glucose) 05/28/2019   Hiatal hernia 05/28/2019   BPPV (benign paroxysmal positional vertigo), left 11/26/2018   Allergic rhinitis due to pollen 11/26/2018   Lymphocytic colitis 01/15/2018   Right lumbar radiculopathy 09/12/2016   Hepatic cyst 11/11/2014   Cancer of skin, squamous cell 03/11/2014   Seborrheic dermatitis 09/05/2012   SCC (squamous cell carcinoma), leg 08/29/2011   Left cervical radiculopathy 01/02/2011   POSTURAL LIGHTHEADEDNESS 12/02/2010   CIGARETTE SMOKER 11/29/2010   Fatty liver 10/08/2009   CONSTIPATION, DRUG INDUCED 10/05/2009   DIVERTICULOSIS OF COLON 08/13/2009   [place]EASE[place] LUMBAR 07/08/2007   [place]den[place]EASE, CERVICAL 03/11/2007   BPH (benign prostatic hyperplasia) 02/13/2007   Hyperlipidemia 01/16/2007   ANKYLOSING SPONDYLITIS 12/05/2006    Essential hypertension, benign 12/05/2006    Aul Mangieri PNilda SimmerPT, MPH  05/31/2021, 12:42 PM  [facility]SE[address] [place] [address]Phone: [phone]  Fax:  [phone] Name: WSEQUAN AUXIERMRN: [id number] of Birth: 402/18/1957

## 2021-05-31 NOTE — Patient Instructions (Signed)
Access Code: [email or web address] URL: [email or web address] Date: 05/31/2021 Prepared by: Gillermo Murdoch  Exercises Seated Cervical Retraction - 3 x daily - 7 x weekly - 10 reps - 1 sets Standing Scapular Retraction - 3 x daily - 7 x weekly - 10 reps - 1 sets - 10 hold Seated Shoulder Flexion Towel Slide at Table Top Full Range of Motion - 2 x daily - 7 x weekly - 1 sets - 5-10 reps - 10sec hold Circular Shoulder Pendulum with Table Support - 3-4 x daily - 7 x weekly - 1 sets - 20-30 reps Seated Shoulder External Rotation AAROM with Cane and Hand in Neutral - 2 x daily - 7 x weekly - 1 sets - 5-10 reps - 5-10 sec hold Standing Shoulder and Trunk Flexion at Table - 2 x daily - 7 x weekly - 1 sets - 5 reps - 5 sec hold Seated Shoulder Flexion AAROM with Pulley Behind - 2 x daily - 7 x weekly - 1 sets - 10 reps - 10 sec hold Standing Isometric Shoulder Extension with Doorway - Arm Bent - 2 x daily - 7 x weekly - 1 sets - 5-10 reps - 5 sec hold Standing Isometric Shoulder External Rotation with Doorway - 2 x daily - 7 x weekly - 1 sets - 5-10 reps - 5 sec hold Standing Isometric Shoulder Internal Rotation with Towel Roll at Doorway - 2 x daily - 7 x weekly - 1 sets - 5 reps - 5 sec hold Standing Isometric Shoulder Internal Rotation with Towel Roll at Doorway - 2 x daily - 7 x weekly - 1 sets - 5 reps - 5 sec hold Standing Isometric Shoulder Flexion with Doorway - Arm Bent - 2 x daily - 7 x weekly - 1 sets - 5 reps - 5 sec hold Supine Shoulder Flexion Extension AAROM with Dowel - 2 x daily - 7 x weekly - 1 sets - 5-10 reps - 3 sec hold Standing Shoulder Extension with Dowel - 2 x daily - 7 x weekly - 1 sets - 5-10 reps - 3 seconds hold Shoulder External Rotation Reactive Isometrics - 2 x daily - 7 x weekly - 1 sets - 10 reps - 3-5 sec hold Shoulder Internal Rotation Reactive Isometrics - 2 x daily - 7 x weekly - 1 sets - 10 reps - 3-5 sec hold Standing Shoulder Flexion Reactive Isometric - 2 x daily  - 7 x weekly - 1 sets - 10 reps - 3-5 sec hold Sidelying Shoulder External Rotation - 2 x daily - 7 x weekly - 1 sets - 10 reps - 3 sec hold Scapular Retraction with Resistance - 2 x daily - 7 x weekly - 10 reps - 3 sets Scapular Retraction with Resistance Advanced - 2 x daily - 7 x weekly - 10 reps - 3 sets Supine Shoulder Rhythmic Stabilization- Horizontal Abduction/Adduction - 2 x daily - 7 x weekly - 1 sets - 5-10 reps Standing Shoulder Internal Rotation Stretch with Towel - 2 x daily - 7 x weekly - 1 sets - 3-5 reps - 15-20 sec hold Bicep Stretch at Table - 2 x daily - 7 x weekly - 1 sets - 3 reps - 30 sec hold Isometric Shoulder External Rotation with Ball at [employer] - 7 x weekly Standing Wall [employer] with [employer] Push Up - 2 x daily - 7 x weekly - 1 sets - 10 reps - 3 sec hold Standing Lat  Pull Down with Resistance - Elbows Bent - 2 x daily - 7 x weekly - 1 sets - 10 reps - 3 sec hold Forearm Plank on Wall - 2 x daily - 7 x weekly - 1 sets - 3 reps - 30 sec hold

## 2021-06-02 ENCOUNTER — Ambulatory Visit (INDEPENDENT_AMBULATORY_CARE_PROVIDER_SITE_OTHER): Payer: BC Managed Care – PPO | Admitting: Family Medicine

## 2021-06-02 ENCOUNTER — Other Ambulatory Visit: Payer: Self-pay

## 2021-06-02 ENCOUNTER — Encounter: Payer: Self-pay | Admitting: Family Medicine

## 2021-06-02 ENCOUNTER — Encounter: Payer: BC Managed Care – PPO | Admitting: Rehabilitative and Restorative Service Providers"

## 2021-06-02 VITALS — BP 119/81 | HR 67 | Ht 73.0 in | Wt 191.0 lb

## 2021-06-02 DIAGNOSIS — Z23 Encounter for immunization: Secondary | ICD-10-CM | POA: Diagnosis not present

## 2021-06-02 DIAGNOSIS — I10 Essential (primary) hypertension: Secondary | ICD-10-CM | POA: Diagnosis not present

## 2021-06-02 DIAGNOSIS — R7989 Other specified abnormal findings of blood chemistry: Secondary | ICD-10-CM | POA: Diagnosis not present

## 2021-06-02 DIAGNOSIS — R7301 Impaired fasting glucose: Secondary | ICD-10-CM

## 2021-06-02 DIAGNOSIS — F17200 Nicotine dependence, unspecified, uncomplicated: Secondary | ICD-10-CM

## 2021-06-02 LAB — POCT GLYCOSYLATED HEMOGLOBIN (HGB A1C): Hemoglobin A1C: 5.4 % (ref 4.0–5.6)

## 2021-06-02 LAB — BASIC METABOLIC PANEL WITH GFR
BUN/Creatinine Ratio: 22 (calc) (ref 6–22)
BUN: 28 mg/dL — ABNORMAL HIGH (ref 7–25)
CO2: 28 mmol/L (ref 20–32)
Calcium: 9.4 mg/dL (ref 8.6–10.3)
Chloride: 107 mmol/L (ref 98–110)
Creat: 1.25 mg/dL (ref 0.70–1.35)
Glucose, Bld: 99 mg/dL (ref 65–99)
Potassium: 4.4 mmol/L (ref 3.5–5.3)
Sodium: 142 mmol/L (ref 135–146)
eGFR: 64 mL/min/{1.73_m2} (ref >=60)

## 2021-06-02 NOTE — Assessment & Plan Note (Signed)
Well controlled. Continue current regimen. Follow up in  6mo . Due BMP 

## 2021-06-02 NOTE — Assessment & Plan Note (Signed)
Well controlled. Continue current regimen. Follow up in  12 mo  

## 2021-06-02 NOTE — Assessment & Plan Note (Signed)
Discussed options.  Discussed Chantix vs Wellbutrin.

## 2021-06-02 NOTE — Progress Notes (Signed)
Established Patient Office Visit  Subjective:  Patient ID: Raymond Mitchell, male    DOB: 03-29-1956  Age: 65 y.o. MRN: [id number]  CC:  Chief Complaint  Patient presents with   Hypertension   ifg    HPI [name] presents for   Hypertension- Pt denies chest pain, SOB, dizziness, or heart palpitations.  Taking meds as directed w/o problems.  Denies medication side effects.    Impaired fasting glucose-no increased thirst or urination. No symptoms consistent with hypoglycemia.  Raymond Mitchell still doing therapy for his shoulder.  Raymond Mitchell is also on antibiotics currently for an infected tooth which Raymond Mitchell will likely have to have extracted.  Raymond Mitchell is just not made the appointment with the periodontist yet.  Past Medical History:  Diagnosis Date   Ankylosing spondylitis (HCC)    BPH (benign prostatic hyperplasia)    hx of   Diverticulosis    Lymphocytic colitis 01/15/2018   Pes planus    Right inguinal hernia    Tobacco user    Varicose veins     Past Surgical History:  Procedure Laterality Date   BACK SURGERY  12/2007   L 4 to L 5 fusion   BACK SURGERY  2012   disc repair   c spine ESI     [name]   INGUINAL HERNIA REPAIR  1994   INGUINAL HERNIA REPAIR Right 07/16/2020   Procedure: LAPAROSCOPIC RIGHT INGUINAL HERNIA WITH MESH;  Surgeon: [name], [name], MD;  Location: [facility];  Service: General;  Laterality: Right;   RTC surgery Left 2005    Family History  Problem Relation Age of Onset   Hyperlipidemia Mother    Hypertension Mother    Diabetes Mother    Hyperlipidemia Father    Hypertension Father    Other Father        ank spondy   Other Brother        Brain tumor, CVA   Other Brother        ank spondy    Social History   Socioeconomic History   Marital status: Married    Spouse name: Not on file   Number of children: Not on file   Years of education: Not on file   Highest education level: Not on file  Occupational History   Not  on file  Tobacco Use   Smoking status: Every Day    Packs/day: 1.00    Years: 40.00    Pack years: 40.00    Types: Cigarettes    Last attempt to quit: 12/28/2011    Years since quitting: 9.4   Smokeless tobacco: Never  Vaping Use   Vaping Use: Never used  Substance and Sexual Activity   Alcohol use: Yes    Alcohol/week: 21.0 standard drinks    Types: 21 Standard drinks or equivalent per week    Comment: occ wine   Drug use: Not Currently   Sexual activity: Not on file  Other Topics Concern   Not on file  Social History Narrative   Not on file   Social Determinants of Health   Financial Resource Strain: Not on file  Food Insecurity: Not on file  Transportation Needs: Not on file  Physical Activity: Not on file  Stress: Not on file  Social Connections: Not on file  Intimate Partner Violence: Not on file    Outpatient Medications Prior to Visit  Medication Sig Dispense Refill   amoxicillin (AMOXIL) 500 MG capsule Take 500  mg by mouth 3 (three) times daily.     Ascorbic Acid (VITAMIN C WITH ROSE HIPS) 500 MG tablet Take 500 mg by mouth daily.     aspirin 81 MG tablet Take 81 mg by mouth daily.     atorvastatin (LIPITOR) 40 MG tablet TAKE 1 TABLET BY MOUTH EVERY DAY 90 tablet 3   Calcium Carbonate-Vitamin D 600-400 MG-UNIT tablet Take 1 tablet by mouth daily.     celecoxib (CELEBREX) 200 MG capsule One to 2 tablets by mouth daily as needed for pain. 60 capsule 2   [facility] SENNA PLUS 8.6-50 MG tablet TAKE 2 TABLETS BY MOUTH AT BEDTIME 60 tablet 6   fish oil-omega-3 fatty acids 1000 MG capsule Take by mouth. 1 capsules po daily     gabapentin (NEURONTIN) 300 MG capsule TAKE 1 CAPSULE (300 MG TOTAL) BY MOUTH 3 (THREE) TIMES DAILY. TAKE ONE CAPSULE BY MOUTH 1 TO 3 TIMES DAILY 270 capsule 3   ipratropium (ATROVENT) 0.03 % nasal spray PLACE 2 SPRAYS INTO BOTH NOSTRILS EVERY 12 (TWELVE) HOURS. 90 mL 1   ketoconazole (NIZORAL) 2 % shampoo DAILY FOR ONE WEEK, THEN 2-3 TIMES A WEEK FOR  MAINTENANCE. 240 mL 2   lisinopril (ZESTRIL) 20 MG tablet TAKE 1 TABLET BY MOUTH EVERYDAY AT BEDTIME 90 tablet 1   metoprolol succinate (TOPROL-XL) 100 MG 24 hr tablet TAKE 1 TABLET BY MOUTH EVERY DAY 90 tablet 1   multivitamin (THERAGRAN) per tablet Take 1 tablet by mouth daily.     Potassium 99 MG TABS Take 2 tablets by mouth daily. 2 tabs in am, 1 tab at hs     tamsulosin (FLOMAX) 0.4 MG CAPS capsule TAKE 1 CAPSULE BY MOUTH EVERYDAY AT BEDTIME 90 capsule 1   Thiamine HCl (VITAMIN B1 PO) Take by mouth.     Turmeric (QC TUMERIC COMPLEX PO) Take by mouth daily.     Zinc Sulfate (ZINC 15 PO) Take by mouth daily.     No facility-administered medications prior to visit.    Allergies  Allergen Reactions   Nsaids Other (See Comments)    Per gastroenterologist     ROS Review of Systems    Objective:    Physical Exam Constitutional:      Appearance: Normal appearance. Raymond Mitchell is well-developed.  HENT:     Head: Normocephalic and atraumatic.  Cardiovascular:     Rate and Rhythm: Normal rate and regular rhythm.     Heart sounds: Normal heart sounds.  Pulmonary:     Effort: Pulmonary effort is normal.     Breath sounds: Normal breath sounds.  Skin:    General: Skin is warm and dry.  Neurological:     Mental Status: Raymond Mitchell is alert and oriented to person, place, and time. Mental status is at baseline.  Psychiatric:        Behavior: Behavior normal.    BP 119/81   Pulse 67   Ht '6\' 1"'$  (1.854 m)   Wt 191 lb 0.6 oz (86.7 kg)   SpO2 95%   BMI 25.20 kg/m  Wt Readings from Last 3 Encounters:  06/02/21 191 lb 0.6 oz (86.7 kg)  11/30/20 198 lb (89.8 kg)  10/25/20 198 lb 1.3 oz (89.8 kg)     Health Maintenance Due  Topic Date Due   Zoster Vaccines- Shingrix (1 of 2) Never done   Pneumococcal Vaccine 19-45 Years old (2 - PCV) 09/04/2018   COVID-19 Vaccine (4 - Booster for Pfizer series) 11/05/2020  There are no preventive care reminders to display for this patient.  Lab Results   Component Value Date   TSH 2.65 09/27/2020   Lab Results  Component Value Date   WBC 8.8 09/27/2020   HGB 14.0 09/27/2020   HCT 41.5 09/27/2020   MCV 100.7 (H) 09/27/2020   PLT 237 09/27/2020   Lab Results  Component Value Date   NA 142 12/10/2020   K 4.5 12/10/2020   CO2 32 12/10/2020   GLUCOSE 123 (H) 12/10/2020   BUN 23 12/10/2020   CREATININE 1.33 (H) 12/10/2020   BILITOT 0.5 12/10/2020   ALKPHOS 55 10/06/2016   AST 19 12/10/2020   ALT 22 12/10/2020   PROT 6.4 12/10/2020   ALBUMIN 3.9 10/06/2016   CALCIUM 9.5 12/10/2020   Lab Results  Component Value Date   CHOL 145 12/10/2020   Lab Results  Component Value Date   HDL 40 12/10/2020   Lab Results  Component Value Date   LDLCALC 83 12/10/2020   Lab Results  Component Value Date   TRIG 121 12/10/2020   Lab Results  Component Value Date   CHOLHDL 3.6 12/10/2020   Lab Results  Component Value Date   HGBA1C 5.4 06/02/2021      Assessment & Plan:   Problem List Items Addressed This Visit       Cardiovascular and Mediastinum   Essential hypertension, benign    Well controlled. Continue current regimen. Follow up in  6 mo . Due BMP.         Endocrine   IFG (impaired fasting glucose) - Primary    Well controlled. Continue current regimen. Follow up in  12 mo       Relevant Orders   POCT glycosylated hemoglobin (Hb A1C) (Completed)     Other   CIGARETTE SMOKER    Discussed options.  Discussed Chantix vs Wellbutrin.        Other Visit Diagnoses     Elevated serum creatinine       Relevant Orders   BASIC METABOLIC PANEL WITH GFR   Need for immunization against influenza       Relevant Orders   Flu Vaccine QUAD 63moIM (Fluarix, Fluzone & Alfiuria Quad PF) (Completed)       No orders of the defined types were placed in this encounter.   Follow-up: Return in about 6 months (around 11/30/2021) for Hypertension.    CBeatrice Lecher MD

## 2021-06-03 ENCOUNTER — Ambulatory Visit (INDEPENDENT_AMBULATORY_CARE_PROVIDER_SITE_OTHER): Payer: BC Managed Care – PPO | Admitting: Physical Therapy

## 2021-06-03 DIAGNOSIS — R293 Abnormal posture: Secondary | ICD-10-CM

## 2021-06-03 DIAGNOSIS — R29898 Other symptoms and signs involving the musculoskeletal system: Secondary | ICD-10-CM | POA: Diagnosis not present

## 2021-06-03 DIAGNOSIS — Z9889 Other specified postprocedural states: Secondary | ICD-10-CM

## 2021-06-03 DIAGNOSIS — M6281 Muscle weakness (generalized): Secondary | ICD-10-CM | POA: Diagnosis not present

## 2021-06-03 NOTE — Progress Notes (Signed)
Hi Willie, kidney function looks better this time which is great.

## 2021-06-03 NOTE — Therapy (Signed)
[facility] [place], [place], [address] Phone: [phone]   Fax:  [phone]  Physical Therapy Treatment  Patient Details  Name: Raymond Mitchell MRN: [id number] Date of Birth: [birth year] Referring Provider (PT): Dr Ophelia Charter   Encounter Date: 06/03/2021   PT End of Session - 06/03/21 1221     Visit Number 18    Number of Visits 24    Date for PT Re-Evaluation 06/21/21    PT Start Time 0102    PT Stop Time 1227    PT Time Calculation (min) 42 min    Activity Tolerance Patient tolerated treatment well    Behavior During Therapy [facility] for tasks assessed/performed             Past Medical History:  Diagnosis Date   Ankylosing spondylitis ([facility])    BPH (benign prostatic hyperplasia)    hx of   Diverticulosis    Lymphocytic colitis 01/15/2018   Pes planus    Right inguinal hernia    Tobacco user    Varicose veins     Past Surgical History:  Procedure Laterality Date   BACK SURGERY  12/2007   L 4 to L 5 fusion   BACK SURGERY  2012   disc repair   c spine ESI     [name]   INGUINAL HERNIA REPAIR  1994   INGUINAL HERNIA REPAIR Right 07/16/2020   Procedure: LAPAROSCOPIC RIGHT INGUINAL HERNIA WITH MESH;  Surgeon: [name], [name], MD;  Location: [facility];  Service: General;  Laterality: Right;   RTC surgery Left 2005    There were no vitals filed for this visit.   Subjective Assessment - 06/03/21 1148     Subjective Pt states the only time he has pain is with quick movements    Patient Stated Goals use Lt arm again    Currently in Pain? No/denies                [facility] PT Assessment - 06/03/21 0001       Assessment   Medical Diagnosis Lt RCR    Referring Provider (PT) Dr Ophelia Charter    Onset Date/Surgical Date 02/21/21    Hand Dominance Right    Next MD Visit PRN    Prior Therapy for LB and Lt shoulder                           [facility]  Adult PT Treatment/Exercise - 06/03/21 0001       Therapeutic Activites    Lifting 15# KB lifting from 8 inch stool deadlift    Other Therapeutic Activities carry 2# at 90/90 x 2 laps (made it 1.5 before fatigue), carry 2# at full flexion 1.5 laps      Shoulder Exercises: Standing   Other Standing Exercises shoulder press Lt 4# x 10, Scaption with opposite UE hold x 10 2#, bicep curls 5# x 10 bilat      Shoulder Exercises: Pulleys   Flexion 1 minute    Scaption 1 minute      Shoulder Exercises: Therapy Ball   Flexion Right;Left;10 reps    Flexion Limitations pause to stretch into flexion    Other Therapy Ball Exercises bouncing ball overhead 1 min    Other Therapy Ball Exercises ball boune on wall in an arc x 4 - limited by fatigue      Shoulder Exercises: ROM/Strengthening  Wall Pushups 10 reps    Pushups Limitations push up clap/catch x 10 x 2 sets      Shoulder Exercises: Stretch   Other Shoulder Stretches doorway stretch in 3 positions, then overhead stretch x 20 sec x 2 reps      Shoulder Exercises: Body Blade   Flexion 30 seconds;2 reps   Lt   ABduction 30 seconds;1 rep   Lt, scpation     Vasopneumatic   Number Minutes Vasopneumatic  10 minutes    Vasopnuematic Location  Shoulder    Vasopneumatic Pressure Medium    Vasopneumatic Temperature  34                      PT Short Term Goals - 05/11/21 1232       PT SHORT TERM GOAL #1   Title Instruct patient in initial shoulder exercises and postural correction    Time 6    Status Achieved    Target Date 05/10/21      PT SHORT TERM GOAL #2   Title PROM flexion 140 deg; ER at side 40 deg; abduction 60 deg    Time 6    Period Weeks    Status Achieved    Target Date 05/10/21      PT SHORT TERM GOAL #3   Title Improve posture and alignment with patient to demonstrate increased upright posture with posterior shoulder girdle musculature engaged    Time 6    Period Weeks    Status Achieved    Target  Date 05/10/21      PT SHORT TERM GOAL #4   Title Patient to report sleeping in bed instead of recliner    Time 6    Period Weeks    Status Achieved    Target Date 05/10/21               PT Long Term Goals - 05/25/21 1236       PT LONG TERM GOAL #1   Title Increase AROM Lt shoulder to within 5-10 deg of AROM Rt shoulder    Time 12    Period Weeks    Status Partially Met      PT LONG TERM GOAL #2   Title Increase strength Lt shoulder to 4/5 to 4+/5 throughout    Time 12    Period Weeks    Status On-going      PT LONG TERM GOAL #3   Title Begin functional activities including ADL's to return patient to independent ADL's and return to work    Time 12    Period Weeks    Status Partially Met      PT LONG TERM GOAL #4   Title Independent in HEP    Time 12    Period Weeks    Status On-going      PT LONG TERM GOAL #5   Title Improve functional limitation score to 63    Time 12    Period Weeks    Status Achieved                   Plan - 06/03/21 1221     Clinical Impression Statement Pt is progressing well with strength, mostly limited by mm endurance with sustained overhead activities    PT Next Visit Plan progress strength and overhead activities, wt bearing activities    PT [place] and Agree with Plan of Care Patient  Patient will benefit from skilled therapeutic intervention in order to improve the following deficits and impairments:     Visit Diagnosis: Status post left rotator cuff repair  Abnormal posture  Other symptoms and signs involving the musculoskeletal system  Muscle weakness (generalized)     Problem List Patient Active Problem List   Diagnosis Date Noted   Rotator cuff tear, left 01/18/2021   Snoring 09/27/2020   Venous stasis 06/02/2020   IFG (impaired fasting glucose) 05/28/2019   Hiatal hernia 05/28/2019   BPPV (benign paroxysmal positional vertigo), left 11/26/2018    Allergic rhinitis due to pollen 11/26/2018   Lymphocytic colitis 01/15/2018   Right lumbar radiculopathy 09/12/2016   Hepatic cyst 11/11/2014   Cancer of skin, squamous cell 03/11/2014   Seborrheic dermatitis 09/05/2012   SCC (squamous cell carcinoma), leg 08/29/2011   Left cervical radiculopathy 01/02/2011   POSTURAL LIGHTHEADEDNESS 12/02/2010   CIGARETTE SMOKER 11/29/2010   Fatty liver 10/08/2009   CONSTIPATION, DRUG INDUCED 10/05/2009   DIVERTICULOSIS OF COLON 08/13/2009   [place] DISEASE, LUMBAR 07/08/2007   [place] DISEASE, CERVICAL 03/11/2007   BPH (benign prostatic hyperplasia) 02/13/2007   Hyperlipidemia 01/16/2007   ANKYLOSING SPONDYLITIS 12/05/2006   Essential hypertension, benign 12/05/2006  Peace Noyes, PT   Priyana Mccarey 06/03/2021, 12:23 PM  [facility] [address] [place], [place], [address] Phone: [phone]   Fax:  [phone]  Name: Raymond Mitchell MRN: [id number] Date of Birth: 1956/06/03

## 2021-06-07 ENCOUNTER — Encounter: Payer: BC Managed Care – PPO | Admitting: Rehabilitative and Restorative Service Providers"

## 2021-06-09 ENCOUNTER — Encounter: Payer: BC Managed Care – PPO | Admitting: Physical Therapy

## 2021-06-13 DIAGNOSIS — Z79899 Other long term (current) drug therapy: Secondary | ICD-10-CM | POA: Diagnosis not present

## 2021-06-13 DIAGNOSIS — M25561 Pain in right knee: Secondary | ICD-10-CM | POA: Diagnosis not present

## 2021-06-13 DIAGNOSIS — M1712 Unilateral primary osteoarthritis, left knee: Secondary | ICD-10-CM | POA: Diagnosis not present

## 2021-06-13 DIAGNOSIS — Z5181 Encounter for therapeutic drug level monitoring: Secondary | ICD-10-CM | POA: Diagnosis not present

## 2021-06-13 DIAGNOSIS — G894 Chronic pain syndrome: Secondary | ICD-10-CM | POA: Diagnosis not present

## 2021-06-13 DIAGNOSIS — M961 Postlaminectomy syndrome, not elsewhere classified: Secondary | ICD-10-CM | POA: Diagnosis not present

## 2021-06-15 ENCOUNTER — Other Ambulatory Visit: Payer: Self-pay

## 2021-06-15 ENCOUNTER — Ambulatory Visit (INDEPENDENT_AMBULATORY_CARE_PROVIDER_SITE_OTHER): Payer: BC Managed Care – PPO | Admitting: Physical Therapy

## 2021-06-15 DIAGNOSIS — Z9889 Other specified postprocedural states: Secondary | ICD-10-CM

## 2021-06-15 DIAGNOSIS — M6281 Muscle weakness (generalized): Secondary | ICD-10-CM

## 2021-06-15 DIAGNOSIS — R29898 Other symptoms and signs involving the musculoskeletal system: Secondary | ICD-10-CM | POA: Diagnosis not present

## 2021-06-15 DIAGNOSIS — R293 Abnormal posture: Secondary | ICD-10-CM

## 2021-06-15 NOTE — Therapy (Addendum)
[place], [place], [address] Phone: [phone]   Fax:  [phone]  Physical Therapy Treatment  Patient Details  Name: Raymond Mitchell MRN: [id number] Date of Birth: 05/31/1956 Referring Provider (PT): Dr Ophelia Charter   Encounter Date: 06/15/2021   PT End of Session - 06/15/21 1247     Visit Number 19    Number of Visits 24    Date for PT Re-Evaluation 06/21/21    PT Start Time 1148    PT Stop Time 1236   MHP last 10 min   PT Time Calculation (min) 48 min    Activity Tolerance Patient tolerated treatment well    Behavior During Therapy [facility] for tasks assessed/performed             Past Medical History:  Diagnosis Date   Ankylosing spondylitis ([place])    BPH (benign prostatic hyperplasia)    hx of   Diverticulosis    Lymphocytic colitis 01/15/2018   Pes planus    Right inguinal hernia    Tobacco user    Varicose veins     Past Surgical History:  Procedure Laterality Date   BACK SURGERY  12/2007   L 4 to L 5 fusion   BACK SURGERY  2012   disc repair   c spine ESI     [name]   INGUINAL HERNIA REPAIR  1994   INGUINAL HERNIA REPAIR Right 07/16/2020   Procedure: LAPAROSCOPIC RIGHT INGUINAL HERNIA WITH MESH;  Surgeon: [name], [name], MD;  Location: [place];  Service: General;  Laterality: Right;   RTC surgery Left 2005    There were no vitals filed for this visit.   Subjective Assessment - 06/15/21 1249     Subjective Pt reports he used his bow flex on Sunday for full body work out and he felt great.  He complains of difficulty at end range (lifts his arm overhead to demonstrate).    Pertinent History Lt shoulder sx 2005; Lumbar surgeries x 2; HNP cervical; HTN    Patient Stated Goals use Lt arm again    Currently in Pain? No/denies    Pain Score 0-No pain                [facility] PT Assessment - 06/15/21 0001       Assessment   Medical  Diagnosis Lt RCR    Referring Provider (PT) Dr Ophelia Charter    Onset Date/Surgical Date 02/21/21    Hand Dominance Right    Next MD Visit PRN    Prior Therapy for LB and Lt shoulder      AROM   Right Shoulder Flexion 153 Degrees    Right Shoulder External Rotation 85 Degrees    Left Shoulder Flexion 150 Degrees    Left Shoulder ABduction 150 Degrees    Left Shoulder External Rotation 65 Degrees   abdct 90, elbow 90 deg.             [place] Adult PT Treatment/Exercise - 06/15/21 0001       Shoulder Exercises: Supine   External Rotation Left;10 reps;AAROM   cane     Shoulder Exercises: Sidelying   Other Sidelying Exercises sleeper stretch LUE / ER x 30 sec      Shoulder Exercises: Pulleys   Flexion 1 minute    Scaption 1 minute      Shoulder Exercises: ROM/Strengthening   UBE (Upper Arm Bike) L8: 1.5  min forward, 1.5 min backward.    Other ROM/Strengthening Exercises verbally reviewed current strengthening exercises at home.      Shoulder Exercises: Stretch   Other Shoulder Stretches doorway stretch - 3 positions, with cues on posture x 15 sec x 2 reps each.  Overhead door stretch x 10 sec x 3. Lt pec stretch with arm abdct 90 deg on wall x 20 sec x 3      Modalities   Modalities Moist Heat      Moist Heat Therapy   Number Minutes Moist Heat 10 Minutes    Moist Heat Location Shoulder   Lt     Manual Therapy   Manual therapy comments skilled palpation to assess dry needling, performed by Isabelle Course, PT    Soft tissue mobilization STM to Lt subscap, lat, pec.    Passive ROM Lt shoulder ER              Trigger Point Dry Needling - 06/15/21 0001     Consent Given? Yes    Education Handout Provided Yes    Muscles Treated Upper Quadrant Latissimus dorsi;Subscapularis    Subscapularis Response Twitch response elicited;Palpable increased muscle length    Latissimus dorsi Response Twitch response elicited;Palpable increased muscle length              PT  Education - 06/15/21 1218     Education Details DN info.    Person(s) Educated Patient    Methods Explanation;Handout    Comprehension Verbalized understanding              PT Short Term Goals - 05/11/21 1232       PT SHORT TERM GOAL #1   Title Instruct patient in initial shoulder exercises and postural correction    Time 6    Status Achieved    Target Date 05/10/21      PT SHORT TERM GOAL #2   Title PROM flexion 140 deg; ER at side 40 deg; abduction 60 deg    Time 6    Period Weeks    Status Achieved    Target Date 05/10/21      PT SHORT TERM GOAL #3   Title Improve posture and alignment with patient to demonstrate increased upright posture with posterior shoulder girdle musculature engaged    Time 6    Period Weeks    Status Achieved    Target Date 05/10/21      PT SHORT TERM GOAL #4   Title Patient to report sleeping in bed instead of recliner    Time 6    Period Weeks    Status Achieved    Target Date 05/10/21               PT Long Term Goals - 05/25/21 1236       PT LONG TERM GOAL #1   Title Increase AROM Lt shoulder to within 5-10 deg of AROM Rt shoulder    Time 12    Period Weeks    Status Partially Met      PT LONG TERM GOAL #2   Title Increase strength Lt shoulder to 4/5 to 4+/5 throughout    Time 12    Period Weeks    Status On-going      PT LONG TERM GOAL #3   Title Begin functional activities including ADL's to return patient to independent ADL's and return to work    Time 12    Period Weeks  Status Partially Met      PT LONG TERM GOAL #4   Title Independent in HEP    Time 12    Period Weeks    Status On-going      PT LONG TERM GOAL #5   Title Improve functional limitation score to 63    Time 12    Period Weeks    Status Achieved                   Plan - 06/15/21 1221     Clinical Impression Statement Good improvement in Lt shoulder ROM; continues limitation with ER.  Trial of DN performed by supervising PT,  Santiago Glad. Recommended pt continue working ER in supine with cane, for less compensation compared to standing/ doorway stretch.  Reviewed stretches for shoulder, with cues for proper form.  Pt verbalized readiness to d/c after next visit.    PT Frequency 2x / week    PT Duration 12 weeks    PT Treatment/Interventions ADLs/Self Care Home Management;Aquatic Therapy;Cryotherapy;Electrical Stimulation;Iontophoresis 24m/ml Dexamethasone;Moist Heat;Ultrasound;Therapeutic activities;Therapeutic exercise;Balance training;Neuromuscular re-education;Patient/family education;Manual techniques;Passive range of motion;Dry needling;Taping;Vasopneumatic Device    PT Next Visit Plan assess strength and goals. finalize HEP.    PT Home Exercise Plan [email or web address]   Consulted and Agree with Plan of Care Patient             Patient will benefit from skilled therapeutic intervention in order to improve the following deficits and impairments:  Decreased range of motion, Impaired UE functional use, Decreased activity tolerance, Pain, Hypomobility, Impaired flexibility, Improper body mechanics, Other (comment), Decreased mobility, Decreased strength, Increased edema, Postural dysfunction  Visit Diagnosis: Status post left rotator cuff repair  Abnormal posture  Other symptoms and signs involving the musculoskeletal system  Muscle weakness (generalized)     Problem List Patient Active Problem List   Diagnosis Date Noted   Rotator cuff tear, left 01/18/2021   Snoring 09/27/2020   Venous stasis 06/02/2020   IFG (impaired fasting glucose) 05/28/2019   Hiatal hernia 05/28/2019   BPPV (benign paroxysmal positional vertigo), left 11/26/2018   Allergic rhinitis due to pollen 11/26/2018   Lymphocytic colitis 01/15/2018   Right lumbar radiculopathy 09/12/2016   Hepatic cyst 11/11/2014   Cancer of skin, squamous cell 03/11/2014   Seborrheic dermatitis 09/05/2012   SCC (squamous cell carcinoma), leg 08/29/2011   Left  cervical radiculopathy 01/02/2011   POSTURAL LIGHTHEADEDNESS 12/02/2010   CIGARETTE SMOKER 11/29/2010   Fatty liver 10/08/2009   CONSTIPATION, DRUG INDUCED 10/05/2009   DIVERTICULOSIS OF COLON 08/13/2009   [place], LUMBAR 07/08/2007   [place]EASE, CERVICAL 03/11/2007   BPH (benign prostatic hyperplasia) 02/13/2007   Hyperlipidemia 01/16/2007   ANKYLOSING SPONDYLITIS 12/05/2006   Essential hypertension, benign 12/05/2006   KIsabelle Course PT,DPT09/16/228:46 AM  JKerin Perna PTA 06/15/21 12:52 PM   [facility] [address] [address]Phone: [phone]  Fax:  [phone] Name: Raymond YEATTSMRN: [id number] of Birth: 4November 08, 1957

## 2021-06-15 NOTE — Patient Instructions (Signed)

## 2021-06-16 DIAGNOSIS — M1711 Unilateral primary osteoarthritis, right knee: Secondary | ICD-10-CM | POA: Diagnosis not present

## 2021-06-17 ENCOUNTER — Other Ambulatory Visit: Payer: Self-pay

## 2021-06-17 ENCOUNTER — Ambulatory Visit (INDEPENDENT_AMBULATORY_CARE_PROVIDER_SITE_OTHER): Payer: BC Managed Care – PPO | Admitting: Rehabilitative and Restorative Service Providers"

## 2021-06-17 ENCOUNTER — Encounter: Payer: Self-pay | Admitting: Rehabilitative and Restorative Service Providers"

## 2021-06-17 DIAGNOSIS — R29898 Other symptoms and signs involving the musculoskeletal system: Secondary | ICD-10-CM

## 2021-06-17 DIAGNOSIS — Z9889 Other specified postprocedural states: Secondary | ICD-10-CM

## 2021-06-17 DIAGNOSIS — R293 Abnormal posture: Secondary | ICD-10-CM | POA: Diagnosis not present

## 2021-06-17 DIAGNOSIS — M6281 Muscle weakness (generalized): Secondary | ICD-10-CM | POA: Diagnosis not present

## 2021-06-17 NOTE — Patient Instructions (Signed)
Access Code: [email or web address] URL: [email or web address] Date: 06/17/2021 Prepared by: Gillermo Murdoch  Exercises Seated Shoulder Flexion AAROM with Pulley Behind - 2 x daily - 7 x weekly - 1 sets - 10 reps - 10 sec hold Scapular Retraction with Resistance - 2 x daily - 7 x weekly - 10 reps - 3 sets Scapular Retraction with Resistance Advanced - 2 x daily - 7 x weekly - 10 reps - 3 sets Standing Lat Pull Down with Resistance - Elbows Bent - 2 x daily - 7 x weekly - 1 sets - 10 reps - 3 sec hold Forearm Plank on Wall - 2 x daily - 7 x weekly - 1 sets - 3 reps - 30 sec hold Standing Bicep Stretch at Wall - 1 x daily - 7 x weekly - 1 sets - 2-3 reps - 20 seconds hold Doorway Pec Stretch at 120 Degrees Abduction - 1 x daily - 7 x weekly - 1 sets - 2-3 reps - 20 seconds hold Doorway Pec Stretch at 90 Degrees Abduction - 2 x daily - 7 x weekly - 1 sets - 3 reps - 30 sec hold Isometric Shoulder External Rotation in Abduction with Ball at Wall - 1 x daily - 7 x weekly - 1-2 min hold Seated Shoulder External Rotation in Abduction Supported with Resistance - 2 x daily - 7 x weekly - 2 sets - 5-10 reps - 3-5 sec hold

## 2021-06-17 NOTE — Therapy (Signed)
[name], [place], [address] Phone: [phone]   Fax:  [phone]  Physical Therapy Treatment Discarge Summary PHYSICAL THERAPY DISCHARGE SUMMARY  Visits from Start of Care: 20  Current functional level related to goals / functional outcomes: See progress note for discharge status    Remaining deficits: Needs to continue to work on end range mobility in ER/IR and strength in flex/abd/ER   Education / Equipment: HEP    Patient agrees to discharge. Patient goals were met. Patient is being discharged due to meeting the stated rehab goals.  Raymond Mitchell P. Helene Kelp PT, MPH 06/17/21 12:31 PM   Patient Details  Name: Raymond Mitchell MRN: [id number] Date of Birth: Mar 01, 1956 Referring Provider (PT): Dr Ophelia Charter   Encounter Date: 06/17/2021   PT End of Session - 06/17/21 1146     Visit Number 20    Number of Visits 24    Date for PT Re-Evaluation 06/21/21    PT Start Time 3790    PT Stop Time 1233    PT Time Calculation (min) 48 min    Activity Tolerance Patient tolerated treatment well             Past Medical History:  Diagnosis Date   Ankylosing spondylitis ([place])    BPH (benign prostatic hyperplasia)    hx of   Diverticulosis    Lymphocytic colitis 01/15/2018   Pes planus    Right inguinal hernia    Tobacco user    Varicose veins     Past Surgical History:  Procedure Laterality Date   BACK SURGERY  12/2007   L 4 to L 5 fusion   BACK SURGERY  2012   disc repair   c spine ESI     [name]   INGUINAL HERNIA REPAIR  1994   INGUINAL HERNIA REPAIR Right 07/16/2020   Procedure: LAPAROSCOPIC RIGHT INGUINAL HERNIA WITH MESH;  Surgeon: [name], [name], MD;  Location: [place];  Service: General;  Laterality: Right;   RTC surgery Left 2005    There were no vitals filed for this visit.   Subjective Assessment - 06/17/21 1148     Subjective Patient reports  good response to DN with less tightness in the Lt shoulder area. He has resumed his exercises at home with his bowflex. Confident in continuing independent HEP.    Currently in Pain? No/denies    Pain Score 0-No pain    Pain Location Shoulder    Pain Orientation Left                [facility] PT Assessment - 06/17/21 0001       Assessment   Medical Diagnosis Lt RCR    Referring Provider (PT) Dr Ophelia Charter    Onset Date/Surgical Date 02/21/21    Hand Dominance Right    Next MD Visit PRN    Prior Therapy for LB and Lt shoulder      AROM   Right Shoulder Flexion 153 Degrees    Right Shoulder External Rotation 85 Degrees    Left Shoulder Flexion 150 Degrees    Left Shoulder ABduction 150 Degrees    Left Shoulder External Rotation 70 Degrees   abdct 90, elbow 90 deg.     PROM   Left Shoulder Extension 70 Degrees    Left Shoulder Flexion 161 Degrees    Left Shoulder ABduction 164 Degrees      Strength   Overall  Strength Comments 5/5 bilat shoulders except Lt shoulder flexion 4+/5; abduction 5-/5; ER 5-/5                           [facility] Adult PT Treatment/Exercise - 06/17/21 0001       Shoulder Exercises: Seated   External Rotation Strengthening;Left;10 reps;Theraband    Theraband Level (Shoulder External Rotation) Level 1 (Yellow)    External Rotation Limitations isolating end range ER with Lt shoulder supported at 90 deg; elbow 90 deg working int oend range      Shoulder Exercises: Standing   External Rotation Strengthening;Left    External Rotation Limitations ball on dorsum of hand working on end range ER ball on wall      Shoulder Exercises: Pulleys   Flexion 1 minute    Scaption 1 minute      Shoulder Exercises: ROM/Strengthening   UBE (Upper Arm Bike) L8: 2 min forward, 2 min backward.    Other ROM/Strengthening Exercises verbally reviewed current strengthening exercises at home.      Shoulder Exercises: Stretch   Other Shoulder Stretches  doorway stretch in 3 positions, then overhead stretch x 20 sec x 2 reps      Shoulder Exercises: Body Blade   Flexion 30 seconds;2 reps    ABduction 30 seconds;1 rep    External Rotation 2 reps      Vasopneumatic   Number Minutes Vasopneumatic  10 minutes    Vasopnuematic Location  Shoulder    Vasopneumatic Pressure Medium    Vasopneumatic Temperature  34      Manual Therapy   Soft tissue mobilization deep tissue work through [employer] girdle    Passive ROM Lt shoulder flexion; abduction, ER, IR in scapular plane; extension                     PT Education - 06/17/21 1207     Education Details HEP    Person(s) Educated Patient    Methods Explanation;Demonstration;Tactile cues;Verbal cues;Handout    Comprehension Verbalized understanding;Returned demonstration;Verbal cues required;Tactile cues required              PT Short Term Goals - 05/11/21 1232       PT SHORT TERM GOAL #1   Title Instruct patient in initial shoulder exercises and postural correction    Time 6    Status Achieved    Target Date 05/10/21      PT SHORT TERM GOAL #2   Title PROM flexion 140 deg; ER at side 40 deg; abduction 60 deg    Time 6    Period Weeks    Status Achieved    Target Date 05/10/21      PT SHORT TERM GOAL #3   Title Improve posture and alignment with patient to demonstrate increased upright posture with posterior shoulder girdle musculature engaged    Time 6    Period Weeks    Status Achieved    Target Date 05/10/21      PT SHORT TERM GOAL #4   Title Patient to report sleeping in bed instead of recliner    Time 6    Period Weeks    Status Achieved    Target Date 05/10/21               PT Long Term Goals - 06/17/21 1150       PT LONG TERM GOAL #1   Title Increase AROM Lt  shoulder to within 5-10 deg of AROM Rt shoulder    Time 12    Period Weeks    Status Achieved      PT LONG TERM GOAL #2   Title Increase strength Lt shoulder to 4/5 to 4+/5  throughout    Time 12    Period Weeks    Status Achieved      PT LONG TERM GOAL #3   Title Begin functional activities including ADL's to return patient to independent ADL's and return to work    Time 12    Period Weeks    Status Achieved      PT LONG TERM GOAL #4   Title Independent in HEP    Time 12    Period Weeks    Status Achieved      PT LONG TERM GOAL #5   Title Improve functional limitation score to 63    Time 12    Period Weeks    Status Achieved                   Plan - 06/17/21 1203     Clinical Impression Statement Excellent progress with shoulder rehab. Good ROM and strength. Needs to work on end range ER and strengthening for Lt shoulder flexion, abduction, ER. Goals of therapy accomplished. Patient confident in continuing with independent HEP    PT Next Visit Plan d/c to independent HEP    PT Home Exercise Plan [facility]             Patient will benefit from skilled therapeutic intervention in order to improve the following deficits and impairments:     Visit Diagnosis: Status post left rotator cuff repair  Abnormal posture  Other symptoms and signs involving the musculoskeletal system  Muscle weakness (generalized)     Problem List Patient Active Problem List   Diagnosis Date Noted   Rotator cuff tear, left 01/18/2021   Snoring 09/27/2020   Venous stasis 06/02/2020   IFG (impaired fasting glucose) 05/28/2019   Hiatal hernia 05/28/2019   BPPV (benign paroxysmal positional vertigo), left 11/26/2018   Allergic rhinitis due to pollen 11/26/2018   Lymphocytic colitis 01/15/2018   Right lumbar radiculopathy 09/12/2016   Hepatic cyst 11/11/2014   Cancer of skin, squamous cell 03/11/2014   Seborrheic dermatitis 09/05/2012   SCC (squamous cell carcinoma), leg 08/29/2011   Left cervical radiculopathy 01/02/2011   POSTURAL LIGHTHEADEDNESS 12/02/2010   CIGARETTE SMOKER 11/29/2010   Fatty liver 10/08/2009   CONSTIPATION, DRUG INDUCED  10/05/2009   DIVERTICULOSIS OF COLON 08/13/2009   [place] DISEASE, LUMBAR 07/08/2007   [place] DISEASE, CERVICAL 03/11/2007   BPH (benign prostatic hyperplasia) 02/13/2007   Hyperlipidemia 01/16/2007   ANKYLOSING SPONDYLITIS 12/05/2006   Essential hypertension, benign 12/05/2006    Leilynn Pilat Nilda Simmer, PT, MPH  06/17/2021, 12:29 PM  [facility] [place] [address] [place], [place], [address] Phone: [phone]   Fax:  [phone]  Name: Raymond Mitchell MRN: [id number] Date of Birth: 05-06-1956

## 2021-06-28 ENCOUNTER — Other Ambulatory Visit: Payer: Self-pay | Admitting: Family Medicine

## 2021-06-28 DIAGNOSIS — I10 Essential (primary) hypertension: Secondary | ICD-10-CM

## 2021-07-22 ENCOUNTER — Ambulatory Visit (INDEPENDENT_AMBULATORY_CARE_PROVIDER_SITE_OTHER): Payer: BC Managed Care – PPO | Admitting: Family Medicine

## 2021-07-22 ENCOUNTER — Other Ambulatory Visit: Payer: Self-pay

## 2021-07-22 ENCOUNTER — Encounter: Payer: Self-pay | Admitting: Family Medicine

## 2021-07-22 DIAGNOSIS — J019 Acute sinusitis, unspecified: Secondary | ICD-10-CM | POA: Diagnosis not present

## 2021-07-22 MED ORDER — AMOXICILLIN 875 MG PO TABS: 875.0000 mg | ORAL_TABLET | Freq: Two times a day (BID) | ORAL | 0 refills | Status: AC

## 2021-07-22 NOTE — Progress Notes (Signed)
Acute Office Visit  Subjective:    Patient ID: Raymond Mitchell, male    DOB: 02/24/1956, 65 y.o.   MRN: [id number]  Chief Complaint  Patient presents with   Sinusitis    HPI Patient is in today for pt reports he has been having sxs for past few weeks. His sxs got worse past few days he has felt more congested,more nasal drainage, cough green/yellow mucus. Denies any f/s/c/n/v.  No sore throat.  No ear pain.  No facial pain.  Just feels very tired the last couple of days.   He has been using the medications that he has on hand already Atrovent  Past Medical History:  Diagnosis Date   Ankylosing spondylitis (HCC)    BPH (benign prostatic hyperplasia)    hx of   Diverticulosis    Lymphocytic colitis 01/15/2018   Pes planus    Right inguinal hernia    Tobacco user    Varicose veins     Past Surgical History:  Procedure Laterality Date   BACK SURGERY  12/2007   L 4 to L 5 fusion   BACK SURGERY  2012   disc repair   c spine ESI     Dr Penelope Galas   INGUINAL HERNIA REPAIR  1994   INGUINAL HERNIA REPAIR Right 07/16/2020   Procedure: LAPAROSCOPIC RIGHT INGUINAL HERNIA WITH MESH;  Surgeon: [name], De Blanch, MD;  Location: [facility] ;  Service: General;  Laterality: Right;   RTC surgery Left 2005    Family History  Problem Relation Age of Onset   Hyperlipidemia Mother    Hypertension Mother    Diabetes Mother    Hyperlipidemia Father    Hypertension Father    Other Father        ank spondy   Other Brother        Brain tumor, CVA   Other Brother        ank spondy    Social History   Socioeconomic History   Marital status: Married    Spouse name: Not on file   Number of children: Not on file   Years of education: Not on file   Highest education level: Not on file  Occupational History   Not on file  Tobacco Use   Smoking status: Every Day    Packs/day: 1.00    Years: 40.00    Pack years: 40.00    Types: Cigarettes    Last attempt to  quit: 12/28/2011    Years since quitting: 9.5   Smokeless tobacco: Never  Vaping Use   Vaping Use: Never used  Substance and Sexual Activity   Alcohol use: Yes    Alcohol/week: 21.0 standard drinks    Types: 21 Standard drinks or equivalent per week    Comment: occ wine   Drug use: Not Currently   Sexual activity: Not on file  Other Topics Concern   Not on file  Social History Narrative   Not on file   Social Determinants of Health   Financial Resource Strain: Not on file  Food Insecurity: Not on file  Transportation Needs: Not on file  Physical Activity: Not on file  Stress: Not on file  Social Connections: Not on file  Intimate Partner Violence: Not on file    Outpatient Medications Prior to Visit  Medication Sig Dispense Refill   Ascorbic Acid (VITAMIN C WITH ROSE HIPS) 500 MG tablet Take 500 mg by mouth daily.     aspirin  81 MG tablet Take 81 mg by mouth daily.     atorvastatin (LIPITOR) 40 MG tablet TAKE 1 TABLET BY MOUTH EVERY DAY 90 tablet 3   Calcium Carbonate-Vitamin D 600-400 MG-UNIT tablet Take 1 tablet by mouth daily.     celecoxib (CELEBREX) 200 MG capsule One to 2 tablets by mouth daily as needed for pain. 60 capsule 2   [employer] SENNA PLUS 8.6-50 MG tablet TAKE 2 TABLETS BY MOUTH AT BEDTIME 60 tablet 6   fish oil-omega-3 fatty acids 1000 MG capsule Take by mouth. 1 capsules po daily     gabapentin (NEURONTIN) 300 MG capsule TAKE 1 CAPSULE (300 MG TOTAL) BY MOUTH 3 (THREE) TIMES DAILY. TAKE ONE CAPSULE BY MOUTH 1 TO 3 TIMES DAILY 270 capsule 3   ipratropium (ATROVENT) 0.03 % nasal spray PLACE 2 SPRAYS INTO BOTH NOSTRILS EVERY 12 (TWELVE) HOURS. 90 mL 1   ketoconazole (NIZORAL) 2 % shampoo DAILY FOR ONE WEEK, THEN 2-3 TIMES A WEEK FOR MAINTENANCE. 240 mL 2   lisinopril (ZESTRIL) 20 MG tablet TAKE 1 TABLET BY MOUTH EVERYDAY AT BEDTIME 90 tablet 1   metoprolol succinate (TOPROL-XL) 100 MG 24 hr tablet TAKE 1 TABLET BY MOUTH EVERY DAY 90 tablet 1   multivitamin  (THERAGRAN) per tablet Take 1 tablet by mouth daily.     Potassium 99 MG TABS Take 2 tablets by mouth daily. 2 tabs in am, 1 tab at hs     tamsulosin (FLOMAX) 0.4 MG CAPS capsule TAKE 1 CAPSULE BY MOUTH EVERYDAY AT BEDTIME 90 capsule 1   Thiamine HCl (VITAMIN B1 PO) Take by mouth.     Turmeric (QC TUMERIC COMPLEX PO) Take by mouth daily.     Zinc Sulfate (ZINC 15 PO) Take by mouth daily.     No facility-administered medications prior to visit.    Allergies  Allergen Reactions   Nsaids Other (See Comments)    Per gastroenterologist     Review of Systems     Objective:    Physical Exam Constitutional:      Appearance: He is well-developed.  HENT:     Head: Normocephalic and atraumatic.     Right Ear: External ear normal.     Left Ear: External ear normal.     Nose: Nose normal.  Eyes:     Conjunctiva/sclera: Conjunctivae normal.     Pupils: Pupils are equal, round, and reactive to light.  Neck:     Thyroid: No thyromegaly.  Cardiovascular:     Rate and Rhythm: Normal rate.     Heart sounds: Normal heart sounds.  Pulmonary:     Effort: Pulmonary effort is normal.     Breath sounds: Normal breath sounds.  Musculoskeletal:     Cervical back: Neck supple.  Lymphadenopathy:     Cervical: No cervical adenopathy.  Skin:    General: Skin is warm and dry.  Neurological:     Mental Status: He is alert and oriented to person, place, and time.    There were no vitals taken for this visit. Wt Readings from Last 3 Encounters:  06/02/21 191 lb 0.6 oz (86.7 kg)  11/30/20 198 lb (89.8 kg)  10/25/20 198 lb 1.3 oz (89.8 kg)    Health Maintenance Due  Topic Date Due   Zoster Vaccines- Shingrix (1 of 2) Never done   Pneumococcal Vaccine 66-34 Years old (2 - PCV) 09/04/2018   COVID-19 Vaccine (4 - Booster for Pfizer series) 09/30/2020    There  are no preventive care reminders to display for this patient.   Lab Results  Component Value Date   TSH 2.65 09/27/2020   Lab  Results  Component Value Date   WBC 8.8 09/27/2020   HGB 14.0 09/27/2020   HCT 41.5 09/27/2020   MCV 100.7 (H) 09/27/2020   PLT 237 09/27/2020   Lab Results  Component Value Date   NA 142 06/02/2021   K 4.4 06/02/2021   CO2 28 06/02/2021   GLUCOSE 99 06/02/2021   BUN 28 (H) 06/02/2021   CREATININE 1.25 06/02/2021   BILITOT 0.5 12/10/2020   ALKPHOS 55 10/06/2016   AST 19 12/10/2020   ALT 22 12/10/2020   PROT 6.4 12/10/2020   ALBUMIN 3.9 10/06/2016   CALCIUM 9.4 06/02/2021   EGFR 64 06/02/2021   Lab Results  Component Value Date   CHOL 145 12/10/2020   Lab Results  Component Value Date   HDL 40 12/10/2020   Lab Results  Component Value Date   LDLCALC 83 12/10/2020   Lab Results  Component Value Date   TRIG 121 12/10/2020   Lab Results  Component Value Date   CHOLHDL 3.6 12/10/2020   Lab Results  Component Value Date   HGBA1C 5.4 06/02/2021       Assessment & Plan:   Problem List Items Addressed This Visit   None Visit Diagnoses     Acute non-recurrent sinusitis, unspecified location    -  Primary   Relevant Medications   amoxicillin (AMOXIL) 875 MG tablet      Acute sinusitis-he has had nasal symptoms for a couple weeks and they have suddenly gotten worse in the last 3 days.  No fever.  Thick nasal congestion.  Recommend nasal saline and we will treat with an antibiotic.  If not better in 1 week then please give [place] call back.  Meds ordered this encounter  Medications   amoxicillin (AMOXIL) 875 MG tablet    Sig: Take 1 tablet (875 mg total) by mouth 2 (two) times daily for 10 days.    Dispense:  14 tablet    Refill:  0     Beatrice Lecher, MD

## 2021-07-22 NOTE — Progress Notes (Signed)
Pt reports he has been having sxs for past few weeks. His sxs got worse past few days he has felt more congested,more nasal drainage, cough green/yellow mucus. Denies any f/s/c/n/v.  He has been using the medications that he has on hand already Atrovent.

## 2021-08-03 IMAGING — MR MR SHOULDER*L* W/O CM
5 series · 40 of 40 positions shown · non-contrast
Comparison: None.

CLINICAL DATA: Left shoulder pain for 6-8 weeks. No known injury.
History of left shoulder surgery in 8332.

EXAM:
MRI OF THE LEFT SHOULDER WITHOUT CONTRAST
TECHNIQUE: Multiplanar, multisequence MR imaging of the shoulder was performed.
No intravenous contrast was administered.

[Series 3: T2 fat-sat · axial · 4.0mm · 0.59mm/px · z∈[-45,+41]mm · 8 of 22 slices shown (1 of 3)]
[im 1/22]
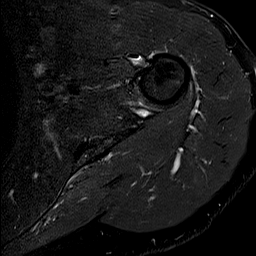
[im 4/22]
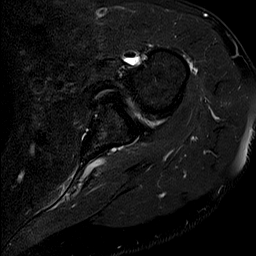
[im 7/22]
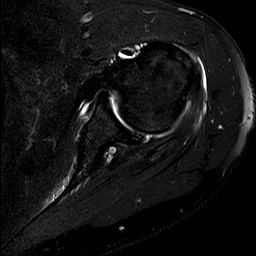
[im 10/22]
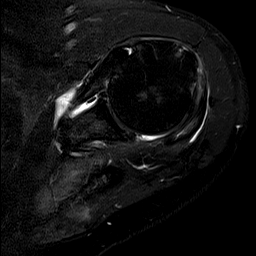
[im 13/22]
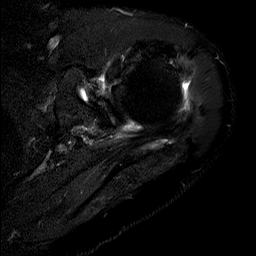
[im 16/22]
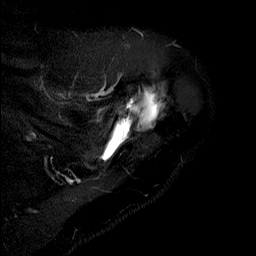
[im 19/22]
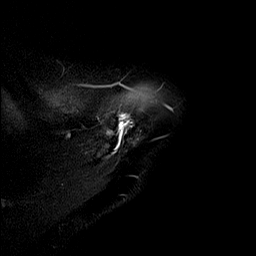
[im 22/22]
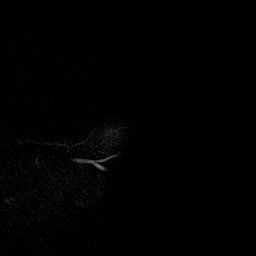

[Series 4: T2 fat-sat · oblique · 4.0mm · 0.59mm/px · 8 of 22 slices shown (2 of 3)]
[im 1/22]
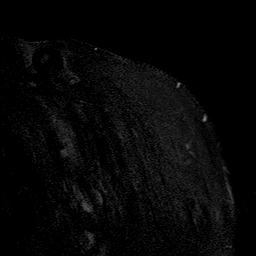
[im 4/22]
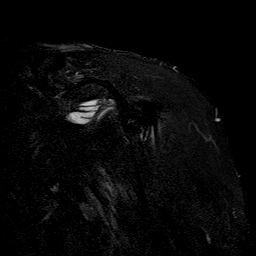
[im 7/22]
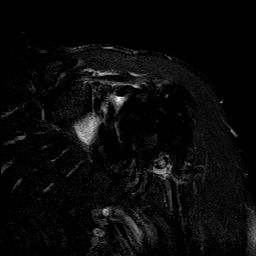
[im 10/22]
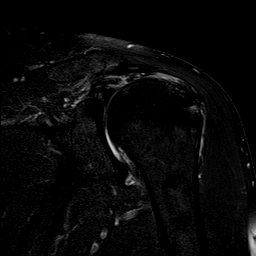
[im 13/22]
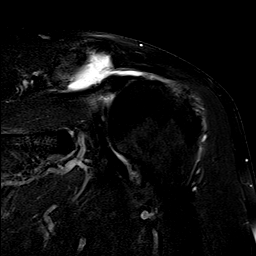
[im 16/22]
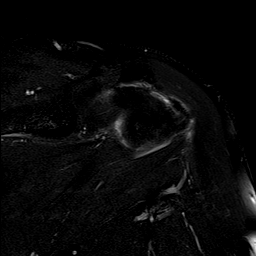
[im 19/22]
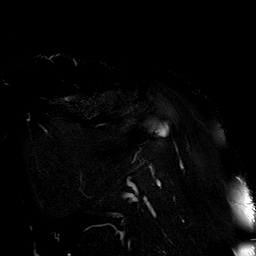
[im 22/22]
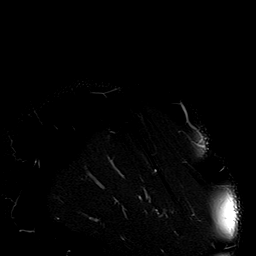

[Series 5: PD · oblique · 4.0mm · 0.59mm/px · 8 of 22 slices shown]
[im 1/22]
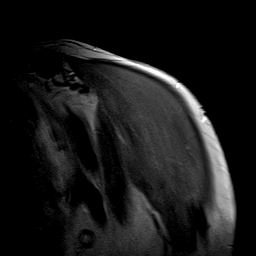
[im 4/22]
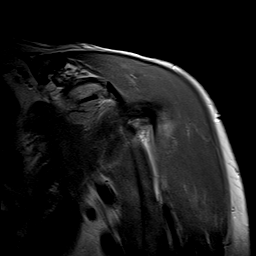
[im 7/22]
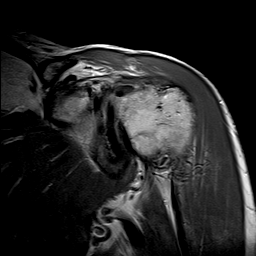
[im 10/22]
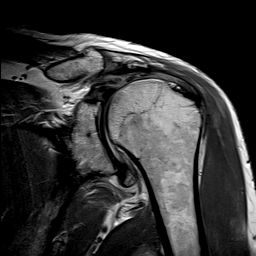
[im 13/22]
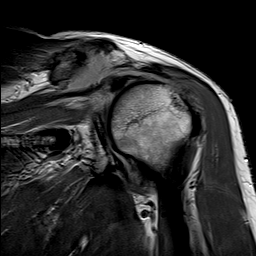
[im 16/22]
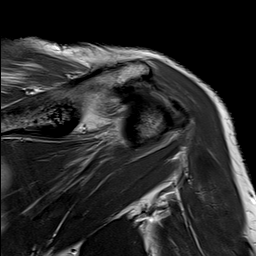
[im 19/22]
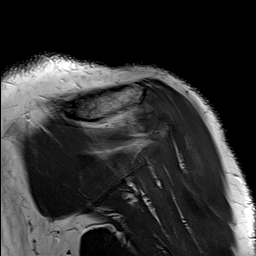
[im 22/22]
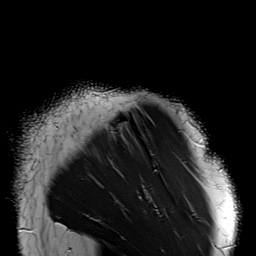

[Series 6: T1 · oblique · 4.0mm · 0.59mm/px · 8 of 24 slices shown]
[im 1/24]
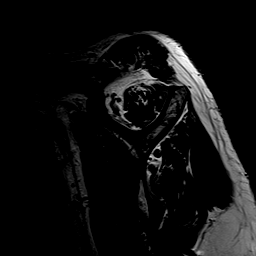
[im 4/24]
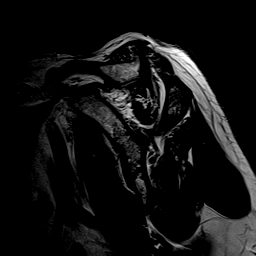
[im 7/24]
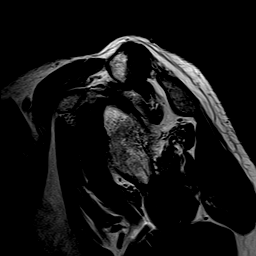
[im 10/24]
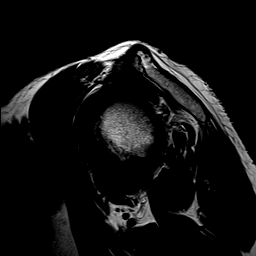
[im 14/24]
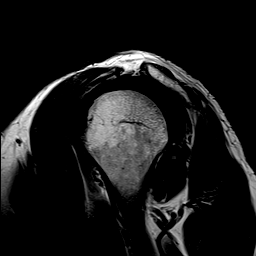
[im 17/24]
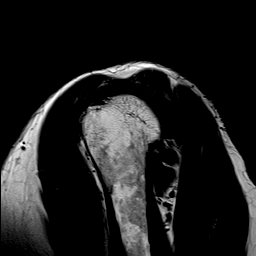
[im 20/24]
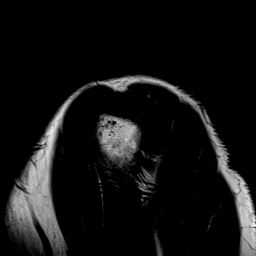
[im 24/24]
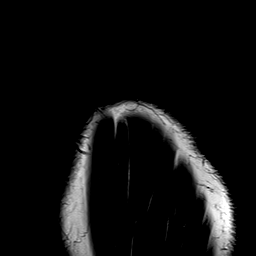

[Series 7: T2 fat-sat · oblique · 4.0mm · 0.59mm/px · 8 of 24 slices shown (3 of 3)]
[im 1/24]
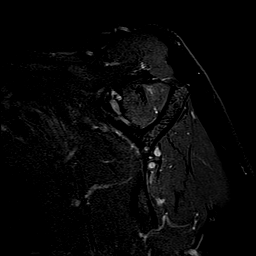
[im 4/24]
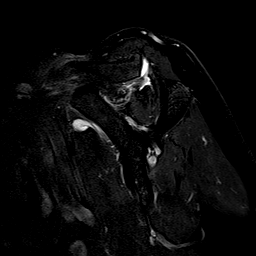
[im 7/24]
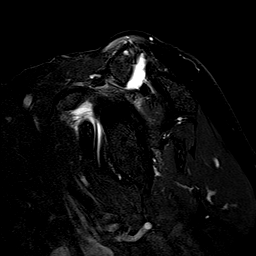
[im 10/24]
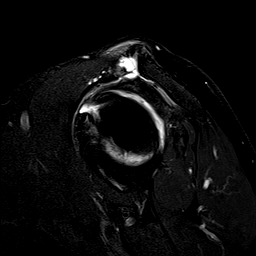
[im 14/24]
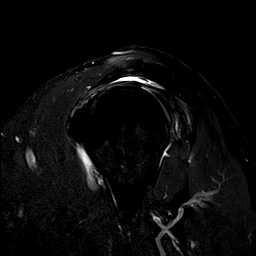
[im 17/24]
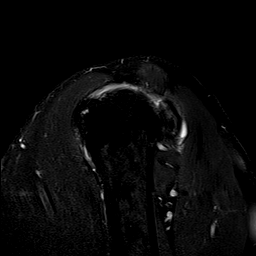
[im 20/24]
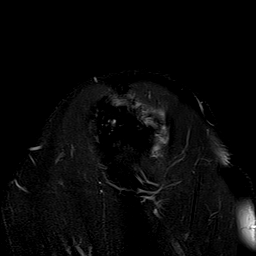
[im 24/24]
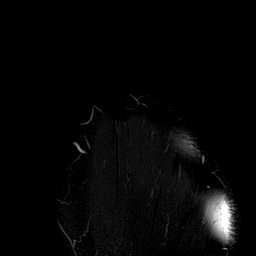

[40 of 40 positions shown; findings below may reference images not displayed]

FINDINGS: Rotator cuff: The patient has complete supraspinatus and near
complete infraspinatus tendon tears. The tears are at the greater
tuberosity and in the critical zone. Tendon retraction is medial to
the top of the humeral head, 3-4 cm. There is subscapularis
tendinopathy without tear.

Muscles:  No atrophy or focal lesion.

Biceps long head:  Intact.

Acromioclavicular Joint: The acromioclavicular joint has been
debrided but there is bony hypertrophy about the joint and some
fluid within it. Type 1 acromion. The patient appears to be status
post acromioplasty. A small volume of fluid is present
subacromial/subdeltoid bursa.

Glenohumeral Joint: Cartilage is mildly thinned. The humeral head is
high-riding due to the patient's cuff tears.

Labrum:  The superior and posterior labrum are markedly degenerated.

Bones:  No fracture, contusion or worrisome lesion.

Other: None.
IMPRESSION: Complete supraspinatus and near-complete infraspinatus tendon tears
with 3-4 cm of retraction. No atrophy.

The acromioclavicular joint appears to have been debrided but there
is some bony hypertrophy about the joint and a joint effusion.

Small volume of fluid in the subacromial/subdeltoid bursa consistent
with bursitis.

Mild appearing glenohumeral osteoarthritis with associated marked
degeneration of the superior and posterior labrum. The humeral head
is high-riding due to the patient's cuff tears.

## 2021-08-11 DIAGNOSIS — M170 Bilateral primary osteoarthritis of knee: Secondary | ICD-10-CM | POA: Diagnosis not present

## 2021-08-11 DIAGNOSIS — M5412 Radiculopathy, cervical region: Secondary | ICD-10-CM | POA: Diagnosis not present

## 2021-08-11 DIAGNOSIS — M25561 Pain in right knee: Secondary | ICD-10-CM | POA: Diagnosis not present

## 2021-08-11 DIAGNOSIS — M961 Postlaminectomy syndrome, not elsewhere classified: Secondary | ICD-10-CM | POA: Diagnosis not present

## 2021-08-16 ENCOUNTER — Other Ambulatory Visit: Payer: Self-pay | Admitting: Family Medicine

## 2021-08-16 DIAGNOSIS — M5416 Radiculopathy, lumbar region: Secondary | ICD-10-CM

## 2021-09-12 ENCOUNTER — Encounter: Payer: Self-pay | Admitting: Family Medicine

## 2021-09-12 ENCOUNTER — Ambulatory Visit (INDEPENDENT_AMBULATORY_CARE_PROVIDER_SITE_OTHER): Payer: BC Managed Care – PPO | Admitting: Family Medicine

## 2021-09-12 ENCOUNTER — Other Ambulatory Visit: Payer: Self-pay

## 2021-09-12 VITALS — BP 129/83 | HR 76 | Temp 98.0°F | Resp 16 | Ht 73.0 in | Wt 187.0 lb

## 2021-09-12 DIAGNOSIS — J101 Influenza due to other identified influenza virus with other respiratory manifestations: Secondary | ICD-10-CM | POA: Diagnosis not present

## 2021-09-12 DIAGNOSIS — R051 Acute cough: Secondary | ICD-10-CM

## 2021-09-12 LAB — POCT INFLUENZA A/B
Influenza A, POC: POSITIVE — AB
Influenza B, POC: NEGATIVE

## 2021-09-12 MED ORDER — OSELTAMIVIR PHOSPHATE 75 MG PO CAPS: 75.0000 mg | ORAL_CAPSULE | Freq: Two times a day (BID) | ORAL | 0 refills | Status: DC

## 2021-09-12 NOTE — Progress Notes (Signed)
Acute Office Visit  Subjective:    Patient ID: Raymond Mitchell, male    DOB: 21-Feb-1956, 65 y.o.   MRN: [id number]  Chief Complaint  Patient presents with   Nasal Congestion    Sneezing, head congestion, cough, 2 days    HPI Patient is in today for Nasal congestion,, sneezing, cough for 2 days.  He says on Saturday he started to get distal bit of a scratchy throat and a couple times knees.  But then he actually felt better before he went to bed that night so did not think much of it.  But then he says Saturday afternoon it started to ramp up again.  He did check his temperature no fever.  He has been exposed to flu at work.  Past Medical History:  Diagnosis Date   Ankylosing spondylitis (HCC)    BPH (benign prostatic hyperplasia)    hx of   Diverticulosis    Lymphocytic colitis 01/15/2018   Pes planus    Right inguinal hernia    Tobacco user    Varicose veins     Past Surgical History:  Procedure Laterality Date   BACK SURGERY  12/2007   L 4 to L 5 fusion   BACK SURGERY  2012   disc repair   c spine ESI     [name]   INGUINAL HERNIA REPAIR  1994   INGUINAL HERNIA REPAIR Right 07/16/2020   Procedure: LAPAROSCOPIC RIGHT INGUINAL HERNIA WITH MESH;  Surgeon: [name], [name], MD;  Location: [place];  Service: General;  Laterality: Right;   RTC surgery Left 2005    Family History  Problem Relation Age of Onset   Hyperlipidemia Mother    Hypertension Mother    Diabetes Mother    Hyperlipidemia Father    Hypertension Father    Other Father        ank spondy   Other Brother        Brain tumor, CVA   Other Brother        ank spondy    Social History   Socioeconomic History   Marital status: Married    Spouse name: Not on file   Number of children: Not on file   Years of education: Not on file   Highest education level: Not on file  Occupational History   Not on file  Tobacco Use   Smoking status: Every Day    Packs/day: 1.00     Years: 40.00    Pack years: 40.00    Types: Cigarettes    Last attempt to quit: 12/28/2011    Years since quitting: 9.7   Smokeless tobacco: Never  Vaping Use   Vaping Use: Never used  Substance and Sexual Activity   Alcohol use: Yes    Alcohol/week: 21.0 standard drinks    Types: 21 Standard drinks or equivalent per week    Comment: occ wine   Drug use: Not Currently   Sexual activity: Not on file  Other Topics Concern   Not on file  Social History Narrative   Not on file   Social Determinants of Health   Financial Resource Strain: Not on file  Food Insecurity: Not on file  Transportation Needs: Not on file  Physical Activity: Not on file  Stress: Not on file  Social Connections: Not on file  Intimate Partner Violence: Not on file    Outpatient Medications Prior to Visit  Medication Sig Dispense Refill   Ascorbic  Acid (VITAMIN C WITH ROSE HIPS) 500 MG tablet Take 500 mg by mouth daily.     aspirin 81 MG tablet Take 81 mg by mouth daily.     atorvastatin (LIPITOR) 40 MG tablet TAKE 1 TABLET BY MOUTH EVERY DAY 90 tablet 3   Calcium Carbonate-Vitamin D 600-400 MG-UNIT tablet Take 1 tablet by mouth daily.     celecoxib (CELEBREX) 200 MG capsule One to 2 tablets by mouth daily as needed for pain. 60 capsule 2   [facility]S SENNA PLUS 8.6-50 MG tablet TAKE 2 TABLETS BY MOUTH AT BEDTIME 60 tablet 6   fish oil-omega-3 fatty acids 1000 MG capsule Take by mouth. 1 capsules po daily     gabapentin (NEURONTIN) 300 MG capsule TAKE 1 CAPSULE (300 MG TOTAL) BY MOUTH 3 (THREE) TIMES DAILY. TAKE ONE CAPSULE BY MOUTH 1 TO 3 TIMES DAILY 270 capsule 3   HYDROcodone-acetaminophen (NORCO) 10-325 MG tablet Take by mouth.     ipratropium (ATROVENT) 0.03 % nasal spray PLACE 2 SPRAYS INTO BOTH NOSTRILS EVERY 12 (TWELVE) HOURS. 90 mL 1   ketoconazole (NIZORAL) 2 % shampoo DAILY FOR ONE WEEK, THEN 2-3 TIMES A WEEK FOR MAINTENANCE. 240 mL 2   lisinopril (ZESTRIL) 20 MG tablet TAKE 1 TABLET BY MOUTH EVERYDAY  AT BEDTIME 90 tablet 1   metoprolol succinate (TOPROL-XL) 100 MG 24 hr tablet TAKE 1 TABLET BY MOUTH EVERY DAY 90 tablet 1   multivitamin (THERAGRAN) per tablet Take 1 tablet by mouth daily.     Potassium 99 MG TABS Take 2 tablets by mouth daily. 2 tabs in am, 1 tab at hs     tamsulosin (FLOMAX) 0.4 MG CAPS capsule TAKE 1 CAPSULE BY MOUTH EVERYDAY AT BEDTIME 90 capsule 1   Thiamine HCl (VITAMIN B1 PO) Take by mouth.     Turmeric (QC TUMERIC COMPLEX PO) Take by mouth daily.     Zinc Sulfate (ZINC 15 PO) Take by mouth daily.     No facility-administered medications prior to visit.    Allergies  Allergen Reactions   Nsaids Other (See Comments)    Per gastroenterologist     Review of Systems     Objective:    Physical Exam Constitutional:      Appearance: He is well-developed.  HENT:     Head: Normocephalic and atraumatic.     Right Ear: External ear normal.     Left Ear: External ear normal.     Nose: Nose normal.  Eyes:     Conjunctiva/sclera: Conjunctivae normal.     Pupils: Pupils are equal, round, and reactive to light.  Neck:     Thyroid: No thyromegaly.  Cardiovascular:     Rate and Rhythm: Normal rate.     Heart sounds: Normal heart sounds.  Pulmonary:     Effort: Pulmonary effort is normal.     Breath sounds: Normal breath sounds.  Musculoskeletal:     Cervical back: Neck supple.  Lymphadenopathy:     Cervical: No cervical adenopathy.  Skin:    General: Skin is warm and dry.  Neurological:     Mental Status: He is alert and oriented to person, place, and time.    BP 129/83   Pulse 76   Temp 98 F (36.7 C)   Resp 16   Ht $R'6\' 1"'ss$  (1.854 m)   Wt 187 lb (84.8 kg)   SpO2 100%   BMI 24.67 kg/m  Wt Readings from Last 3 Encounters:  09/12/21 187  lb (84.8 kg)  06/02/21 191 lb 0.6 oz (86.7 kg)  11/30/20 198 lb (89.8 kg)    Health Maintenance Due  Topic Date Due   HIV Screening  Never done   Zoster Vaccines- Shingrix (1 of 2) Never done   Pneumococcal  Vaccine 39-58 Years old (2 - PCV) 09/04/2018   COVID-19 Vaccine (4 - Booster for Pfizer series) 09/30/2020    There are no preventive care reminders to display for this patient.   Lab Results  Component Value Date   TSH 2.65 09/27/2020   Lab Results  Component Value Date   WBC 8.8 09/27/2020   HGB 14.0 09/27/2020   HCT 41.5 09/27/2020   MCV 100.7 (H) 09/27/2020   PLT 237 09/27/2020   Lab Results  Component Value Date   NA 142 06/02/2021   K 4.4 06/02/2021   CO2 28 06/02/2021   GLUCOSE 99 06/02/2021   BUN 28 (H) 06/02/2021   CREATININE 1.25 06/02/2021   BILITOT 0.5 12/10/2020   ALKPHOS 55 10/06/2016   AST 19 12/10/2020   ALT 22 12/10/2020   PROT 6.4 12/10/2020   ALBUMIN 3.9 10/06/2016   CALCIUM 9.4 06/02/2021   EGFR 64 06/02/2021   Lab Results  Component Value Date   CHOL 145 12/10/2020   Lab Results  Component Value Date   HDL 40 12/10/2020   Lab Results  Component Value Date   LDLCALC 83 12/10/2020   Lab Results  Component Value Date   TRIG 121 12/10/2020   Lab Results  Component Value Date   CHOLHDL 3.6 12/10/2020   Lab Results  Component Value Date   HGBA1C 5.4 06/02/2021       Assessment & Plan:   Problem List Items Addressed This Visit   None Visit Diagnoses     Acute cough    -  Primary   Relevant Orders   POCT Influenza A/B (Completed)   Influenza A       Relevant Medications   oseltamivir (TAMIFLU) 75 MG capsule      Influenza-recommend symptomatic care.  Discussed the option of Tamiflu he would like to do that.  Prescription sent to pharmacy call if not better in 1 week.  EXTR staying well-hydrated.  Meds ordered this encounter  Medications   oseltamivir (TAMIFLU) 75 MG capsule    Sig: Take 1 capsule (75 mg total) by mouth 2 (two) times daily.    Dispense:  10 capsule    Refill:  0     Beatrice Lecher, MD

## 2021-09-16 ENCOUNTER — Ambulatory Visit (INDEPENDENT_AMBULATORY_CARE_PROVIDER_SITE_OTHER): Payer: BC Managed Care – PPO | Admitting: Sports Medicine

## 2021-09-16 ENCOUNTER — Ambulatory Visit (INDEPENDENT_AMBULATORY_CARE_PROVIDER_SITE_OTHER): Payer: BC Managed Care – PPO

## 2021-09-16 ENCOUNTER — Other Ambulatory Visit: Payer: Self-pay

## 2021-09-16 DIAGNOSIS — R1031 Right lower quadrant pain: Secondary | ICD-10-CM | POA: Diagnosis not present

## 2021-09-16 DIAGNOSIS — M47816 Spondylosis without myelopathy or radiculopathy, lumbar region: Secondary | ICD-10-CM | POA: Diagnosis not present

## 2021-09-16 DIAGNOSIS — M1711 Unilateral primary osteoarthritis, right knee: Secondary | ICD-10-CM

## 2021-09-16 DIAGNOSIS — Z09 Encounter for follow-up examination after completed treatment for conditions other than malignant neoplasm: Secondary | ICD-10-CM | POA: Diagnosis not present

## 2021-09-16 DIAGNOSIS — M1712 Unilateral primary osteoarthritis, left knee: Secondary | ICD-10-CM | POA: Diagnosis not present

## 2021-09-16 DIAGNOSIS — M1611 Unilateral primary osteoarthritis, right hip: Secondary | ICD-10-CM | POA: Diagnosis not present

## 2021-09-16 DIAGNOSIS — I878 Other specified disorders of veins: Secondary | ICD-10-CM | POA: Diagnosis not present

## 2021-09-16 DIAGNOSIS — R1030 Lower abdominal pain, unspecified: Secondary | ICD-10-CM | POA: Diagnosis not present

## 2021-09-16 NOTE — Assessment & Plan Note (Signed)
Chronic pain right groin, worse with internal rotation, Celebrex at max dosages as well as some hydrocodone ineffective. Right hip injected today, home conditioning given, updated x-rays today. Return in a month.

## 2021-09-16 NOTE — Progress Notes (Signed)
° ° °  Procedures performed today:    Procedure: Real-time Ultrasound Guided injection of the right hip joint Device: Samsung HS60  Verbal informed consent obtained.  Time-out conducted.  Noted no overlying erythema, induration, or other signs of local infection.  Skin prepped in a sterile fashion.  Local anesthesia: Topical Ethyl chloride.  With sterile technique and under real time ultrasound guidance: Noted arthritic joint, 1 cc Kenalog 40, 2 cc lidocaine, 2 cc bupivacaine injected easily Completed without difficulty  Advised to call if fevers/chills, erythema, induration, drainage, or persistent bleeding.  Images permanently stored and available for review in PACS.  Impression: Technically successful ultrasound guided injection.  Procedure: Real-time Ultrasound Guided injection of the right knee Device: Samsung HS60  Verbal informed consent obtained.  Time-out conducted.  Noted no overlying erythema, induration, or other signs of local infection.  Skin prepped in a sterile fashion.  Local anesthesia: Topical Ethyl chloride.  With sterile technique and under real time ultrasound guidance: Noted trace effusion, 1 cc Kenalog 40, 2 cc lidocaine, 2 cc bupivacaine injected easily Completed without difficulty  Advised to call if fevers/chills, erythema, induration, drainage, or persistent bleeding.  Images permanently stored and available for review in PACS.  Impression: Technically successful ultrasound guided injection.  Independent interpretation of notes and tests performed by another provider:   None.  Brief History, Exam, Impression, and Recommendations:    Primary osteoarthritis of right hip Chronic pain right groin, worse with internal rotation, Celebrex at max dosages as well as some hydrocodone ineffective. Right hip injected today, home conditioning given, updated x-rays today. Return in a month.  Primary osteoarthritis of right knee Known osteoarthritis, last injected  3 months ago, repeat injection but with ultrasound guidance this time, he is doing Celebrex and ibuprofen, discontinue ibuprofen, Celebrex only. Getting updated x-rays. Continue brace, adding conditioning. Return to see me in 4 to 6 weeks.    ___________________________________________ Gwen Her. Dianah Field, M.D., ABFM., CAQSM. Primary Care and [place] Instructor of [place] of [employer]

## 2021-09-16 NOTE — Assessment & Plan Note (Signed)
Known osteoarthritis, last injected 3 months ago, repeat injection but with ultrasound guidance this time, he is doing Celebrex and ibuprofen, discontinue ibuprofen, Celebrex only. Getting updated x-rays. Continue brace, adding conditioning. Return to see me in 4 to 6 weeks.

## 2021-09-30 ENCOUNTER — Other Ambulatory Visit: Payer: Self-pay | Admitting: Family Medicine

## 2021-09-30 ENCOUNTER — Other Ambulatory Visit: Payer: Self-pay | Admitting: Physician Assistant

## 2021-09-30 DIAGNOSIS — M5416 Radiculopathy, lumbar region: Secondary | ICD-10-CM

## 2021-10-11 DIAGNOSIS — M5412 Radiculopathy, cervical region: Secondary | ICD-10-CM | POA: Diagnosis not present

## 2021-10-11 DIAGNOSIS — M25561 Pain in right knee: Secondary | ICD-10-CM | POA: Diagnosis not present

## 2021-10-11 DIAGNOSIS — M170 Bilateral primary osteoarthritis of knee: Secondary | ICD-10-CM | POA: Diagnosis not present

## 2021-10-11 DIAGNOSIS — M961 Postlaminectomy syndrome, not elsewhere classified: Secondary | ICD-10-CM | POA: Diagnosis not present

## 2021-10-25 DIAGNOSIS — H1131 Conjunctival hemorrhage, right eye: Secondary | ICD-10-CM | POA: Diagnosis not present

## 2021-10-28 ENCOUNTER — Other Ambulatory Visit: Payer: Self-pay

## 2021-10-28 ENCOUNTER — Ambulatory Visit: Payer: BC Managed Care – PPO | Admitting: Sports Medicine

## 2021-10-28 DIAGNOSIS — M1711 Unilateral primary osteoarthritis, right knee: Secondary | ICD-10-CM | POA: Diagnosis not present

## 2021-10-28 DIAGNOSIS — M1611 Unilateral primary osteoarthritis, right hip: Secondary | ICD-10-CM

## 2021-10-28 NOTE — Assessment & Plan Note (Signed)
Raymond Mitchell is a pleasant 66 year old male end-stage right hip osteoarthritis bone-on-bone, we have tried multiple medications including Celebrex, he is on hydrocodone 10/325 from his pain doc, we did an injection at the last visit, nothing is working, he does need hip arthroplasty but needs to wait until April. My request would be that his pain management provider bumped him up to oxycodone at least for the next few months to get him through until his hip arthroplasty.

## 2021-10-28 NOTE — Assessment & Plan Note (Signed)
Mild osteoarthritis, we injected his knee back in December, unfortunately did not get much improvement, x-rays do show fairly good joint space, so I suspect there is a meniscal tear, he has failed greater than 6 weeks of physician directed conservative treatment, proceeding with MRI.

## 2021-10-28 NOTE — Progress Notes (Signed)
° ° °  Procedures performed today:    None.  Independent interpretation of notes and tests performed by another provider:   None.  Brief History, Exam, Impression, and Recommendations:    Primary osteoarthritis of right hip Raymond Mitchell is a pleasant 66 year old male end-stage right hip osteoarthritis bone-on-bone, we have tried multiple medications including Celebrex, he is on hydrocodone 10/325 from his pain doc, we did an injection at the last visit, nothing is working, he does need hip arthroplasty but needs to wait until April. My request would be that his pain management provider bumped him up to oxycodone at least for the next few months to get him through until his hip arthroplasty.  Primary osteoarthritis of right knee Mild osteoarthritis, we injected his knee back in December, unfortunately did not get much improvement, x-rays do show fairly good joint space, so I suspect there is a meniscal tear, he has failed greater than 6 weeks of physician directed conservative treatment, proceeding with MRI.    ___________________________________________ Gwen Her. Dianah Field, M.D., ABFM., CAQSM. Primary Care and [place] Instructor of [place] of [employer] of Medicine

## 2021-11-02 ENCOUNTER — Other Ambulatory Visit: Payer: Self-pay | Admitting: Family Medicine

## 2021-11-15 ENCOUNTER — Other Ambulatory Visit: Payer: Self-pay

## 2021-11-15 ENCOUNTER — Ambulatory Visit: Payer: BC Managed Care – PPO | Admitting: Family Medicine

## 2021-11-15 ENCOUNTER — Encounter: Payer: Self-pay | Admitting: Family Medicine

## 2021-11-15 VITALS — BP 126/82 | HR 73 | Resp 18 | Ht 73.0 in | Wt 186.0 lb

## 2021-11-15 DIAGNOSIS — J069 Acute upper respiratory infection, unspecified: Secondary | ICD-10-CM

## 2021-11-15 MED ORDER — PROMETHAZINE-DM 6.25-15 MG/5ML PO SYRP: 5.0000 mL | ORAL_SOLUTION | Freq: Every evening | ORAL | 0 refills | Status: DC | PRN

## 2021-11-15 NOTE — Progress Notes (Signed)
Acute Office Visit  Subjective:    Patient ID: Raymond Mitchell, male    DOB: 1955-10-25, 66 y.o.   MRN: [id number]  Chief Complaint  Patient presents with   cough    Dry cough, nasal congestion, fatigue, post nasal drainage, 3 days. Covid test negative this morning. Patient requesting for work note.     HPI Patient is in today for congestion and cough.  He says he woke up Sunday morning feeling very congested.  By the next day he was feeling more fatigued but did try to go to work.  He then started developing a cough midday and more intense fatigue.  He does not think he ran a fever.  Past Medical History:  Diagnosis Date   Ankylosing spondylitis (HCC)    BPH (benign prostatic hyperplasia)    hx of   Diverticulosis    Lymphocytic colitis 01/15/2018   Pes planus    Right inguinal hernia    Tobacco user    Varicose veins     Past Surgical History:  Procedure Laterality Date   BACK SURGERY  12/2007   L 4 to L 5 fusion   BACK SURGERY  2012   disc repair   c spine ESI     [name]   INGUINAL HERNIA REPAIR  1994   INGUINAL HERNIA REPAIR Right 07/16/2020   Procedure: LAPAROSCOPIC RIGHT INGUINAL HERNIA WITH MESH;  Surgeon: [name], [name], MD;  Location: [place];  Service: General;  Laterality: Right;   RTC surgery Left 2005    Family History  Problem Relation Age of Onset   Hyperlipidemia Mother    Hypertension Mother    Diabetes Mother    Hyperlipidemia Father    Hypertension Father    Other Father        ank spondy   Other Brother        Brain tumor, CVA   Other Brother        ank spondy    Social History   Socioeconomic History   Marital status: Married    Spouse name: Not on file   Number of children: Not on file   Years of education: Not on file   Highest education level: Not on file  Occupational History   Not on file  Tobacco Use   Smoking status: Every Day    Packs/day: 1.00    Years: 40.00    Pack years: 40.00     Types: Cigarettes    Last attempt to quit: 12/28/2011    Years since quitting: 9.8   Smokeless tobacco: Never  Vaping Use   Vaping Use: Never used  Substance and Sexual Activity   Alcohol use: Yes    Alcohol/week: 21.0 standard drinks    Types: 21 Standard drinks or equivalent per week    Comment: occ wine   Drug use: Not Currently   Sexual activity: Not on file  Other Topics Concern   Not on file  Social History Narrative   Not on file   Social Determinants of Health   Financial Resource Strain: Not on file  Food Insecurity: Not on file  Transportation Needs: Not on file  Physical Activity: Not on file  Stress: Not on file  Social Connections: Not on file  Intimate Partner Violence: Not on file    Outpatient Medications Prior to Visit  Medication Sig Dispense Refill   Ascorbic Acid (VITAMIN C WITH ROSE HIPS) 500 MG tablet Take 500 mg  by mouth daily.     aspirin 81 MG tablet Take 81 mg by mouth daily.     atorvastatin (LIPITOR) 40 MG tablet TAKE 1 TABLET BY MOUTH EVERY DAY 90 tablet 3   Calcium Carbonate-Vitamin D 600-400 MG-UNIT tablet Take 1 tablet by mouth daily.     celecoxib (CELEBREX) 200 MG capsule One to 2 tablets by mouth daily as needed for pain. 60 capsule 2   [facility] SENNA PLUS 8.6-50 MG tablet TAKE 2 TABLETS BY MOUTH AT BEDTIME 60 tablet 6   fish oil-omega-3 fatty acids 1000 MG capsule Take by mouth. 1 capsules po daily     gabapentin (NEURONTIN) 300 MG capsule TAKE 1 CAPSULE BY MOUTH UP TO 3 TIMES A DAY 270 capsule 3   ipratropium (ATROVENT) 0.03 % nasal spray PLACE 2 SPRAYS INTO BOTH NOSTRILS EVERY 12 (TWELVE) HOURS. 30 mL 1   ketoconazole (NIZORAL) 2 % shampoo DAILY FOR ONE WEEK, THEN 2-3 TIMES A WEEK FOR MAINTENANCE. 240 mL 2   lisinopril (ZESTRIL) 20 MG tablet TAKE 1 TABLET BY MOUTH EVERYDAY AT BEDTIME 90 tablet 1   metoprolol succinate (TOPROL-XL) 100 MG 24 hr tablet TAKE 1 TABLET BY MOUTH EVERY DAY 90 tablet 1   multivitamin (THERAGRAN) per tablet Take 1  tablet by mouth daily.     naloxone (NARCAN) nasal spray 4 mg/0.1 mL Place into the nose.     oxyCODONE-acetaminophen (PERCOCET) 7.5-325 MG tablet Take by mouth.     Potassium 99 MG TABS Take 2 tablets by mouth daily. 2 tabs in am, 1 tab at hs     tamsulosin (FLOMAX) 0.4 MG CAPS capsule TAKE 1 CAPSULE BY MOUTH EVERYDAY AT BEDTIME 90 capsule 1   Thiamine HCl (VITAMIN B1 PO) Take by mouth.     Turmeric (QC TUMERIC COMPLEX PO) Take by mouth daily.     Zinc Sulfate (ZINC 15 PO) Take by mouth daily.     No facility-administered medications prior to visit.    Allergies  Allergen Reactions   Nsaids Other (See Comments)    Per gastroenterologist     Review of Systems     Objective:    Physical Exam Constitutional:      Appearance: He is well-developed.  HENT:     Head: Normocephalic and atraumatic.     Right Ear: External ear normal.     Left Ear: External ear normal.     Nose: Nose normal.  Eyes:     Conjunctiva/sclera: Conjunctivae normal.     Pupils: Pupils are equal, round, and reactive to light.  Neck:     Thyroid: No thyromegaly.  Cardiovascular:     Rate and Rhythm: Normal rate.     Heart sounds: Normal heart sounds.  Pulmonary:     Effort: Pulmonary effort is normal.     Breath sounds: Normal breath sounds.  Musculoskeletal:     Cervical back: Neck supple.  Lymphadenopathy:     Cervical: No cervical adenopathy.  Skin:    General: Skin is warm and dry.  Neurological:     Mental Status: He is alert and oriented to person, place, and time.    BP 126/82    Pulse 73    Resp 18    Ht _0  (1.854 m)    Wt 186 lb (84.4 kg)    SpO2 98%    BMI 24.54 kg/m  Wt Readings from Last 3 Encounters:  11/15/21 186 lb (84.4 kg)  09/12/21 187 lb (84.8 kg)  06/02/21 191 lb 0.6 oz (86.7 kg)    Health Maintenance Due  Topic Date Due   HIV Screening  Never done   Zoster Vaccines- Shingrix (1 of 2) Never done   Pneumococcal Vaccine 29-8 Years old (2 - PCV) 09/04/2018    COVID-19 Vaccine (4 - Booster for Pfizer series) 09/30/2020    There are no preventive care reminders to display for this patient.   Lab Results  Component Value Date   TSH 2.65 09/27/2020   Lab Results  Component Value Date   WBC 8.8 09/27/2020   HGB 14.0 09/27/2020   HCT 41.5 09/27/2020   MCV 100.7 (H) 09/27/2020   PLT 237 09/27/2020   Lab Results  Component Value Date   NA 142 06/02/2021   K 4.4 06/02/2021   CO2 28 06/02/2021   GLUCOSE 99 06/02/2021   BUN 28 (H) 06/02/2021   CREATININE 1.25 06/02/2021   BILITOT 0.5 12/10/2020   ALKPHOS 55 10/06/2016   AST 19 12/10/2020   ALT 22 12/10/2020   PROT 6.4 12/10/2020   ALBUMIN 3.9 10/06/2016   CALCIUM 9.4 06/02/2021   EGFR 64 06/02/2021   Lab Results  Component Value Date   CHOL 145 12/10/2020   Lab Results  Component Value Date   HDL 40 12/10/2020   Lab Results  Component Value Date   LDLCALC 83 12/10/2020   Lab Results  Component Value Date   TRIG 121 12/10/2020   Lab Results  Component Value Date   CHOLHDL 3.6 12/10/2020   Lab Results  Component Value Date   HGBA1C 5.4 06/02/2021       Assessment & Plan:   Problem List Items Addressed This Visit   None Visit Diagnoses     Viral upper respiratory tract infection    -  Primary   Relevant Medications   promethazine-dextromethorphan (PROMETHAZINE-DM) 6.25-15 MG/5ML syrup      URI  - consistent with a viral illness.  Call if not better by the end of the week.  Work note given. Cough med given. He is already on chronic pain medications.   Meds ordered this encounter  Medications   promethazine-dextromethorphan (PROMETHAZINE-DM) 6.25-15 MG/5ML syrup    Sig: Take 5 mLs by mouth at bedtime as needed for cough.    Dispense:  118 mL    Refill:  0     Beatrice Lecher, MD

## 2021-11-18 DIAGNOSIS — M25551 Pain in right hip: Secondary | ICD-10-CM | POA: Diagnosis not present

## 2021-11-18 DIAGNOSIS — M25561 Pain in right knee: Secondary | ICD-10-CM | POA: Diagnosis not present

## 2021-11-23 ENCOUNTER — Telehealth: Payer: Self-pay | Admitting: Family Medicine

## 2021-11-23 NOTE — Telephone Encounter (Signed)
Patient scheduled to have A1c checked with nurse this Friday.

## 2021-11-23 NOTE — Telephone Encounter (Signed)
Patient let him know that we received a preoperative clearance from [facility].  We actually do need an up-to-date hemoglobin A1c on him since last 1 was the most a year ago it has to be within acceptable range to proceed with surgery and once we get that I can complete his paperwork.

## 2021-11-25 ENCOUNTER — Other Ambulatory Visit: Payer: Self-pay

## 2021-11-25 ENCOUNTER — Ambulatory Visit: Payer: BC Managed Care – PPO

## 2021-11-25 DIAGNOSIS — R7301 Impaired fasting glucose: Secondary | ICD-10-CM

## 2021-11-25 NOTE — Progress Notes (Signed)
Ordered labs per telephone message.

## 2021-11-26 LAB — HEMOGLOBIN A1C
Hgb A1c MFr Bld: 5.5 % of total Hgb (ref <5.7)
Mean Plasma Glucose: 111 mg/dL
eAG (mmol/L): 6.2 mmol/L

## 2021-11-26 NOTE — Progress Notes (Signed)
Your lab work is within acceptable range and there are no concerning findings.   ?

## 2021-11-30 ENCOUNTER — Ambulatory Visit: Payer: BC Managed Care – PPO | Admitting: Family Medicine

## 2021-11-30 ENCOUNTER — Encounter: Payer: Self-pay | Admitting: Family Medicine

## 2021-11-30 ENCOUNTER — Other Ambulatory Visit: Payer: Self-pay

## 2021-11-30 VITALS — BP 138/89 | HR 69 | Resp 18 | Ht 73.0 in | Wt 186.0 lb

## 2021-11-30 DIAGNOSIS — Z5181 Encounter for therapeutic drug level monitoring: Secondary | ICD-10-CM | POA: Diagnosis not present

## 2021-11-30 DIAGNOSIS — Z125 Encounter for screening for malignant neoplasm of prostate: Secondary | ICD-10-CM | POA: Diagnosis not present

## 2021-11-30 DIAGNOSIS — Z23 Encounter for immunization: Secondary | ICD-10-CM | POA: Diagnosis not present

## 2021-11-30 DIAGNOSIS — E785 Hyperlipidemia, unspecified: Secondary | ICD-10-CM

## 2021-11-30 DIAGNOSIS — M170 Bilateral primary osteoarthritis of knee: Secondary | ICD-10-CM | POA: Diagnosis not present

## 2021-11-30 DIAGNOSIS — M25561 Pain in right knee: Secondary | ICD-10-CM | POA: Diagnosis not present

## 2021-11-30 DIAGNOSIS — K52832 Lymphocytic colitis: Secondary | ICD-10-CM

## 2021-11-30 DIAGNOSIS — R7301 Impaired fasting glucose: Secondary | ICD-10-CM

## 2021-11-30 DIAGNOSIS — I10 Essential (primary) hypertension: Secondary | ICD-10-CM

## 2021-11-30 DIAGNOSIS — Z79899 Other long term (current) drug therapy: Secondary | ICD-10-CM | POA: Diagnosis not present

## 2021-11-30 DIAGNOSIS — M961 Postlaminectomy syndrome, not elsewhere classified: Secondary | ICD-10-CM | POA: Diagnosis not present

## 2021-11-30 DIAGNOSIS — M5412 Radiculopathy, cervical region: Secondary | ICD-10-CM | POA: Diagnosis not present

## 2021-11-30 LAB — CBC
HCT: 40.7 % (ref 38.5–50.0)
Hemoglobin: 13.6 g/dL (ref 13.2–17.1)
MCH: 33.3 pg — ABNORMAL HIGH (ref 27.0–33.0)
MCHC: 33.4 g/dL (ref 32.0–36.0)
MCV: 99.5 fL (ref 80.0–100.0)
MPV: 9.5 fL (ref 7.5–12.5)
Platelets: 269 10*3/uL (ref 140–400)
RBC: 4.09 10*6/uL — ABNORMAL LOW (ref 4.20–5.80)
RDW: 11.8 % (ref 11.0–15.0)
WBC: 9.1 10*3/uL (ref 3.8–10.8)

## 2021-11-30 LAB — COMPLETE METABOLIC PANEL WITH GFR
AG Ratio: 1.7 (calc) (ref 1.0–2.5)
ALT: 14 U/L (ref 9–46)
AST: 14 U/L (ref 10–35)
Albumin: 3.9 g/dL (ref 3.6–5.1)
Alkaline phosphatase (APISO): 69 U/L (ref 35–144)
BUN: 23 mg/dL (ref 7–25)
CO2: 29 mmol/L (ref 20–32)
Calcium: 9.3 mg/dL (ref 8.6–10.3)
Chloride: 107 mmol/L (ref 98–110)
Creat: 1.3 mg/dL (ref 0.70–1.35)
Globulin: 2.3 g/dL (calc) (ref 1.9–3.7)
Glucose, Bld: 105 mg/dL — ABNORMAL HIGH (ref 65–99)
Potassium: 4.7 mmol/L (ref 3.5–5.3)
Sodium: 143 mmol/L (ref 135–146)
Total Bilirubin: 0.6 mg/dL (ref 0.2–1.2)
Total Protein: 6.2 g/dL (ref 6.1–8.1)
eGFR: 61 mL/min/{1.73_m2} (ref >=60)

## 2021-11-30 LAB — LIPID PANEL
Cholesterol: 138 mg/dL (ref <200)
HDL: 39 mg/dL — ABNORMAL LOW (ref >=40)
LDL Cholesterol (Calc): 86 mg/dL (calc)
Non-HDL Cholesterol (Calc): 99 mg/dL (calc) (ref <130)
Total CHOL/HDL Ratio: 3.5 (calc) (ref <5.0)
Triglycerides: 58 mg/dL (ref <150)

## 2021-11-30 LAB — PSA: PSA: 0.81 ng/mL (ref <=4.00)

## 2021-11-30 NOTE — Assessment & Plan Note (Signed)
Is due for repeat colon cancer screening but wants to wait until after he gets his knee and hip taking care of so probably later this year he says he will let me know. ?

## 2021-11-30 NOTE — Assessment & Plan Note (Signed)
Pressure normally looks fantastic but it is elevated today he has been off of cough and cold medicine for about 3 days at this point so it should really be better so we may need to keep an eye on this over the next couple weeks to see if it comes back down he does have a home blood pressure cuff and says he can take it over the weekend and let us know if it still elevated or not. ?

## 2021-11-30 NOTE — Assessment & Plan Note (Signed)
A1c last week was 5.5 which was fantastic. ?

## 2021-11-30 NOTE — Progress Notes (Signed)
Established Patient Office Visit  Subjective:  Patient ID: Raymond Mitchell, male    DOB: 1956/09/05  Age: 66 y.o. MRN: [id number]  CC:  Chief Complaint  Patient presents with   Hypertension    Follow up     HPI Raymond Mitchell presents for    Hypertension- Pt denies chest pain, SOB, dizziness, or heart palpitations.  Taking meds as directed w/o problems.  Denies medication side effects.    He was seen for an upper respiratory infection a couple of weeks ago and says he is actually feeling much better he says he was finally able to discontinue the cold medicines and decongestants on the weekend.  Did note he was feeling a little dizzy for a few days on some of the cold medicines but now he is wondering if it may have been his blood pressure  He is fasting today to go ahead and get his cholesterol drawn.  He is tolerating the statin well without any side effects or problems.   Past Medical History:  Diagnosis Date   Ankylosing spondylitis (HCC)    BPH (benign prostatic hyperplasia)    hx of   Diverticulosis    Lymphocytic colitis 01/15/2018   Pes planus    Right inguinal hernia    Tobacco user    Varicose veins     Past Surgical History:  Procedure Laterality Date   BACK SURGERY  12/2007   L 4 to L 5 fusion   BACK SURGERY  2012   disc repair   c spine ESI     Dr Penelope Galas   INGUINAL HERNIA REPAIR  1994   INGUINAL HERNIA REPAIR Right 07/16/2020   Procedure: LAPAROSCOPIC RIGHT INGUINAL HERNIA WITH MESH;  Surgeon: [name], De Blanch, MD;  Location: [facility];  Service: General;  Laterality: Right;   RTC surgery Left 2005    Family History  Problem Relation Age of Onset   Hyperlipidemia Mother    Hypertension Mother    Diabetes Mother    Hyperlipidemia Father    Hypertension Father    Other Father        ank spondy   Other Brother        Brain tumor, CVA   Other Brother        ank spondy    Social History   Socioeconomic History    Marital status: Married    Spouse name: Not on file   Number of children: Not on file   Years of education: Not on file   Highest education level: Not on file  Occupational History   Not on file  Tobacco Use   Smoking status: Every Day    Packs/day: 1.00    Years: 40.00    Pack years: 40.00    Types: Cigarettes    Last attempt to quit: 12/28/2011    Years since quitting: 9.9   Smokeless tobacco: Never   Tobacco comments:    1 pack a day   Vaping Use   Vaping Use: Never used  Substance and Sexual Activity   Alcohol use: Yes    Alcohol/week: 21.0 standard drinks    Types: 21 Standard drinks or equivalent per week    Comment: occ wine   Drug use: Not Currently   Sexual activity: Not on file  Other Topics Concern   Not on file  Social History Narrative   Not on file   Social Determinants of Health   Financial Resource Strain:  Not on file  Food Insecurity: Not on file  Transportation Needs: Not on file  Physical Activity: Not on file  Stress: Not on file  Social Connections: Not on file  Intimate Partner Violence: Not on file    Outpatient Medications Prior to Visit  Medication Sig Dispense Refill   Ascorbic Acid (VITAMIN C WITH ROSE HIPS) 500 MG tablet Take 500 mg by mouth daily.     aspirin 81 MG tablet Take 81 mg by mouth daily.     atorvastatin (LIPITOR) 40 MG tablet TAKE 1 TABLET BY MOUTH EVERY DAY 90 tablet 3   Calcium Carbonate-Vitamin D 600-400 MG-UNIT tablet Take 1 tablet by mouth daily.     celecoxib (CELEBREX) 200 MG capsule One to 2 tablets by mouth daily as needed for pain. 60 capsule 2   [facility] SENNA PLUS 8.6-50 MG tablet TAKE 2 TABLETS BY MOUTH AT BEDTIME 60 tablet 6   fish oil-omega-3 fatty acids 1000 MG capsule Take by mouth. 1 capsules po daily     gabapentin (NEURONTIN) 300 MG capsule TAKE 1 CAPSULE BY MOUTH UP TO 3 TIMES A DAY 270 capsule 3   ipratropium (ATROVENT) 0.03 % nasal spray PLACE 2 SPRAYS INTO BOTH NOSTRILS EVERY 12 (TWELVE) HOURS. 30 mL 1    ketoconazole (NIZORAL) 2 % shampoo DAILY FOR ONE WEEK, THEN 2-3 TIMES A WEEK FOR MAINTENANCE. 240 mL 2   lisinopril (ZESTRIL) 20 MG tablet TAKE 1 TABLET BY MOUTH EVERYDAY AT BEDTIME 90 tablet 1   metoprolol succinate (TOPROL-XL) 100 MG 24 hr tablet TAKE 1 TABLET BY MOUTH EVERY DAY 90 tablet 1   multivitamin (THERAGRAN) per tablet Take 1 tablet by mouth daily.     oxyCODONE-acetaminophen (PERCOCET) 7.5-325 MG tablet Take by mouth.     Potassium 99 MG TABS Take 2 tablets by mouth daily. 2 tabs in am, 1 tab at hs     tamsulosin (FLOMAX) 0.4 MG CAPS capsule TAKE 1 CAPSULE BY MOUTH EVERYDAY AT BEDTIME 90 capsule 1   Thiamine HCl (VITAMIN B1 PO) Take by mouth.     Turmeric (QC TUMERIC COMPLEX PO) Take by mouth daily.     Zinc Sulfate (ZINC 15 PO) Take by mouth daily.     naloxone (NARCAN) nasal spray 4 mg/0.1 mL Place into the nose.     promethazine-dextromethorphan (PROMETHAZINE-DM) 6.25-15 MG/5ML syrup Take 5 mLs by mouth at bedtime as needed for cough. 118 mL 0   No facility-administered medications prior to visit.    Allergies  Allergen Reactions   Nsaids Other (See Comments)    Per gastroenterologist     ROS Review of Systems    Objective:    Physical Exam Constitutional:      Appearance: Normal appearance. He is well-developed.  HENT:     Head: Normocephalic and atraumatic.  Cardiovascular:     Rate and Rhythm: Normal rate and regular rhythm.     Heart sounds: Normal heart sounds.  Pulmonary:     Effort: Pulmonary effort is normal.     Breath sounds: Normal breath sounds.  Skin:    General: Skin is warm and dry.  Neurological:     Mental Status: He is alert and oriented to person, place, and time. Mental status is at baseline.  Psychiatric:        Behavior: Behavior normal.    BP 138/89 (BP Location: Right Arm)    Pulse 69    Resp 18    Ht $R'6\' 1"'FP$  (1.854 m)  Wt 186 lb (84.4 kg)    SpO2 99%    BMI 24.54 kg/m  Wt Readings from Last 3 Encounters:  11/30/21 186 lb (84.4  kg)  11/15/21 186 lb (84.4 kg)  09/12/21 187 lb (84.8 kg)     Health Maintenance Due  Topic Date Due   HIV Screening  Never done    There are no preventive care reminders to display for this patient.  Lab Results  Component Value Date   TSH 2.65 09/27/2020   Lab Results  Component Value Date   WBC 8.8 09/27/2020   HGB 14.0 09/27/2020   HCT 41.5 09/27/2020   MCV 100.7 (H) 09/27/2020   PLT 237 09/27/2020   Lab Results  Component Value Date   NA 142 06/02/2021   K 4.4 06/02/2021   CO2 28 06/02/2021   GLUCOSE 99 06/02/2021   BUN 28 (H) 06/02/2021   CREATININE 1.25 06/02/2021   BILITOT 0.5 12/10/2020   ALKPHOS 55 10/06/2016   AST 19 12/10/2020   ALT 22 12/10/2020   PROT 6.4 12/10/2020   ALBUMIN 3.9 10/06/2016   CALCIUM 9.4 06/02/2021   EGFR 64 06/02/2021   Lab Results  Component Value Date   CHOL 145 12/10/2020   Lab Results  Component Value Date   HDL 40 12/10/2020   Lab Results  Component Value Date   LDLCALC 83 12/10/2020   Lab Results  Component Value Date   TRIG 121 12/10/2020   Lab Results  Component Value Date   CHOLHDL 3.6 12/10/2020   Lab Results  Component Value Date   HGBA1C 5.5 11/25/2021      Assessment & Plan:   Problem List Items Addressed This Visit       Cardiovascular and Mediastinum   Essential hypertension, benign - Primary    Pressure normally looks fantastic but it is elevated today he has been off of cough and cold medicine for about 3 days at this point so it should really be better so we may need to keep an eye on this over the next couple weeks to see if it comes back down he does have a home blood pressure cuff and says he can take it over the weekend and let us know if it still elevated or not.      Relevant Orders   PSA   Lipid panel   COMPLETE METABOLIC PANEL WITH GFR   CBC     Digestive   Lymphocytic colitis    Is due for repeat colon cancer screening but wants to wait until after he gets his knee and hip  taking care of so probably later this year he says he will let me know.        Endocrine   IFG (impaired fasting glucose)    A1c last week was 5.5 which was fantastic.      Relevant Orders   PSA   Lipid panel   COMPLETE METABOLIC PANEL WITH GFR   CBC     Other   Hyperlipidemia   Relevant Orders   PSA   Lipid panel   COMPLETE METABOLIC PANEL WITH GFR   CBC   Other Visit Diagnoses     Immunization due       Relevant Orders   Pneumococcal conjugate vaccine 20-valent (Prevnar 20) (Completed)   Varicella-zoster vaccine IM (Shingrix) (Completed)   Screening for prostate cancer       Relevant Orders   PSA      Given Prevnar  and shingles.    No orders of the defined types were placed in this encounter.   Follow-up: Return in about 6 months (around 06/02/2022) for Hypertension.    Beatrice Lecher, MD

## 2021-12-01 NOTE — Progress Notes (Signed)
Hi Raymond Mitchell, LDL cholesterol is under 100 which is good.  The HDL is a little on the lower end so just work on increasing your regular exercise to improve that number.  And liver function is stable.  Hemoglobin is normal.  No sign of anemia.  Prostate test is normal.

## 2021-12-09 ENCOUNTER — Telehealth: Payer: Self-pay

## 2021-12-09 DIAGNOSIS — I10 Essential (primary) hypertension: Secondary | ICD-10-CM

## 2021-12-09 NOTE — Telephone Encounter (Signed)
Patient stated he has been taking his BP readings at home for 1 week. Patient stated BP readings have been running around mid to high 130's over 86-96. Patient stated he has had 2 BP readings within the week around the 120's over 75 and 2 other readings around 148/92. Patient stated he is still feeling lightheaded at times. Forward to Dr. Madilyn Fireman for review.  ?

## 2021-12-12 MED ORDER — LISINOPRIL 40 MG PO TABS: 40.0000 mg | ORAL_TABLET | Freq: Every day | ORAL | 1 refills | Status: DC

## 2021-12-12 NOTE — Telephone Encounter (Signed)
Call patient: Since most of the blood pressures are greater than 130 we will have him increase his lisinopril to 40 mg.  If he has plenty of his current prescription that he can take 2 daily of his current regimen and follow his blood pressures over the next 2 weeks to see if they are improving.  We can always send over new prescription for the 40 mg at that point. ?

## 2021-12-12 NOTE — Telephone Encounter (Signed)
Patient advised of message and verbalized understanding and verbalized understanding. Patient would like to proceed with prescription for Lisinopril 40 mg daily.  ?

## 2021-12-27 ENCOUNTER — Other Ambulatory Visit: Payer: Self-pay | Admitting: Family Medicine

## 2022-01-05 DIAGNOSIS — M25561 Pain in right knee: Secondary | ICD-10-CM | POA: Diagnosis not present

## 2022-01-16 DIAGNOSIS — M25561 Pain in right knee: Secondary | ICD-10-CM | POA: Diagnosis not present

## 2022-01-17 ENCOUNTER — Other Ambulatory Visit: Payer: Self-pay

## 2022-01-17 DIAGNOSIS — S46012A Strain of muscle(s) and tendon(s) of the rotator cuff of left shoulder, initial encounter: Secondary | ICD-10-CM

## 2022-01-17 MED ORDER — CELECOXIB 200 MG PO CAPS: ORAL_CAPSULE | ORAL | 2 refills | Status: DC

## 2022-01-23 NOTE — H&P (Signed)
HIP ARTHROPLASTY ADMISSION H&P ? ?Patient ID: ?Raymond Mitchell ?MRN: [id number] ?DOB/AGE: 1956-02-19 66 y.o. ? ?Chief Complaint: right hip pain. ? ?Planned Procedure Date: 02/14/22 ?Medical and Cardiac Clearance by Dr. Sol Blazing   ?PM&R clearance by [place]i Caldwell PA-C who wants [place] to manage the post-op pain meds  ? ?HPI: ?Raymond Mitchell is a 66 y.o. male who presents for evaluation of OA RIGHT HIP. The patient has a history of pain and functional disability in the right hip due to arthritis and has failed non-surgical conservative treatments for greater than 12 weeks to include NSAID's and/or analgesics, corticosteriod injections, supervised PT with diminished ADL's post treatment, and activity modification.  Onset of symptoms was gradual, starting 1 year ago with gradually worsening course since that time. The patient noted no past surgery on the right hip.  Patient currently rates pain at 10 out of 10 with activity. Patient has night pain, worsening of pain with activity and weight bearing, and pain that interferes with activities of daily living.  Patient has evidence of subchondral sclerosis, periarticular osteophytes, joint space narrowing, and superior migration of the femoral head in relation to the acetabulum  by imaging studies.  There is no active infection. ? ?Past Medical History:  ?Diagnosis Date  ? Ankylosing spondylitis ([place])   ? BPH (benign prostatic hyperplasia)   ? hx of  ? Diverticulosis   ? Lymphocytic colitis 01/15/2018  ? Pes planus   ? Right inguinal hernia   ? Tobacco user   ? Varicose veins   ? ?Past Surgical History:  ?Procedure Laterality Date  ? BACK SURGERY  12/2007  ? L 4 to L 5 fusion  ? BACK SURGERY  2012  ? disc repair  ? c spine ESI    ? [name]  ? [place]  ? INGUINAL HERNIA REPAIR Right 07/16/2020  ? Procedure: LAPAROSCOPIC RIGHT INGUINAL HERNIA WITH MESH;  Surgeon: [name], [name], MD;  Location: [facility];  Service:  General;  Laterality: Right;  ? RTC surgery Left 2005  ? ?Allergies  ?Allergen Reactions  ? Nsaids Other (See Comments)  ?  Per gastroenterologist   ? ?Prior to Admission medications   ?Medication Sig Start Date End Date Taking? Authorizing Provider  ?Ascorbic Acid (VITAMIN C WITH ROSE HIPS) 500 MG tablet Take 500 mg by mouth daily.    [provider]  ?aspirin 81 MG tablet Take 81 mg by mouth daily.    [provider]  ?atorvastatin (LIPITOR) 40 MG tablet TAKE 1 TABLET BY MOUTH EVERY DAY 02/03/21   Hali Marry, MD  ?Calcium Carbonate-Vitamin D 600-400 MG-UNIT tablet Take 1 tablet by mouth daily.    [provider]  ?celecoxib (CELEBREX) 200 MG capsule One to 2 tablets by mouth daily as needed for pain. 01/17/22   Silverio Decamp, MD  ?[facility] SENNA PLUS 8.6-50 MG tablet TAKE 2 TABLETS BY MOUTH AT BEDTIME 05/24/21   Hali Marry, MD  ?fish oil-omega-3 fatty acids 1000 MG capsule Take by mouth. 1 capsules po daily    [provider]  ?gabapentin (NEURONTIN) 300 MG capsule TAKE 1 CAPSULE BY MOUTH UP TO 3 TIMES A DAY 09/30/21   Hali Marry, MD  ?ipratropium (ATROVENT) 0.03 % nasal spray PLACE 2 SPRAYS INTO BOTH NOSTRILS EVERY 12 (TWELVE) HOURS. 12/27/21   Hali Marry, MD  ?ketoconazole (NIZORAL) 2 % shampoo DAILY FOR ONE WEEK, THEN 2-3 TIMES A WEEK FOR  MAINTENANCE. 02/08/18   Hali Marry, MD  ?lisinopril (ZESTRIL) 20 MG tablet TAKE 1 TABLET BY MOUTH EVERYDAY AT BEDTIME 06/28/21   Hali Marry, MD  ?lisinopril (ZESTRIL) 40 MG tablet Take 1 tablet (40 mg total) by mouth daily. 12/12/21   Hali Marry, MD  ?metoprolol succinate (TOPROL-XL) 100 MG 24 hr tablet TAKE 1 TABLET BY MOUTH EVERY DAY 11/02/21   Hali Marry, MD  ?multivitamin [facility]) per tablet Take 1 tablet by mouth daily.    [provider]  ?Potassium 99 MG TABS Take 2 tablets by mouth daily. 2 tabs in am, 1 tab at hs    [provider]   ?tamsulosin (FLOMAX) 0.4 MG CAPS capsule TAKE 1 CAPSULE BY MOUTH EVERYDAY AT BEDTIME 09/30/21   Hali Marry, MD  ?Thiamine HCl (VITAMIN B1 PO) Take by mouth.    [provider]  ?Turmeric (QC TUMERIC COMPLEX PO) Take by mouth daily.    [provider]  ?Zinc Sulfate (ZINC 15 PO) Take by mouth daily.    [provider]  ? ?Social History  ? ?Socioeconomic History  ? Marital status: Married  ?  Spouse name: Not on file  ? Number of children: Not on file  ? Years of education: Not on file  ? Highest education level: Not on file  ?Occupational History  ? Not on file  ?Tobacco Use  ? Smoking status: Every Day  ?  Packs/day: 1.00  ?  Years: 40.00  ?  Pack years: 40.00  ?  Types: Cigarettes  ?  Last attempt to quit: 12/28/2011  ?  Years since quitting: 10.0  ? Smokeless tobacco: Never  ? Tobacco comments:  ?  1 pack a day   ?Vaping Use  ? Vaping Use: Never used  ?Substance and Sexual Activity  ? Alcohol use: Yes  ?  Alcohol/week: 21.0 standard drinks  ?  Types: 21 Standard drinks or equivalent per week  ?  Comment: occ wine  ? Drug use: Not Currently  ? Sexual activity: Not on file  ?Other Topics Concern  ? Not on file  ?Social History Narrative  ? Not on file  ? ?Social Determinants of Health  ? ?Financial Resource Strain: Not on file  ?Food Insecurity: Not on file  ?Transportation Needs: Not on file  ?Physical Activity: Not on file  ?Stress: Not on file  ?Social Connections: Not on file  ? ?Family History  ?Problem Relation Age of Onset  ? Hyperlipidemia Mother   ? Hypertension Mother   ? Diabetes Mother   ? Hyperlipidemia Father   ? Hypertension Father   ? Other Father   ?     ank spondy  ? Other Brother   ?     Brain tumor, CVA  ? Other Brother   ?     ank spondy  ? ? ?ROS: Currently denies lightheadedness, dizziness, Fever, chills, CP, SOB.   ?No personal history of DVT, PE, MI, or CVA. ?No loose teeth or dentures ?All other systems have been reviewed and were otherwise currently  negative with the exception of those mentioned in the HPI and as above. ? ?Objective: ?Vitals: Ht: 6'1" Wt: 184.2 lbs Temp: 97.9 BP: 128/84 Pulse: 71 O2 95% on room air.   ?Physical Exam: ?General: Alert, NAD. Trendelenberg Gait  ?HEENT: EOMI, Good Neck Extension  ?Pulm: No increased work of breathing.  Clear B/L A/P w/o crackle or wheeze.  ?CV: RRR, No m/g/r appreciated  ?  GI: soft, NT, ND. BS x 4 quadrants ?Neuro: CN II-XII grossly intact without focal deficit.  Sensation intact distally ?Skin: No lesions in the area of chief complaint ?MSK/Surgical Site: + TTP. Hip pain with ROM. + Stinchfield. - SLR. + FABER/FADIR. Decreased strength.  NVI.   ? ?Imaging Review ?Plain radiographs demonstrate severe degenerative joint disease of the right hip.  ? ?The bone quality appears to be fair for age and reported activity level. ? ?Preoperative templating of the joint replacement has been completed, documented, and submitted to the Operating Room personnel in order to optimize intra-operative equipment management. ? ?Assessment: ?OA RIGHT HIP ?Active Problems: ?  * No active hospital problems. * ? ? ?Plan: ?Plan for Procedure(s): ?TOTAL HIP ARTHROPLASTY ANTERIOR APPROACH ? ?The patient history, physical exam, clinical judgement of the provider and imaging are consistent with end stage degenerative joint disease and total joint arthroplasty is deemed medically necessary. The treatment options including medical management, injection therapy, and arthroplasty were discussed at length. The risks and benefits of Procedure(s): ?TOTAL HIP ARTHROPLASTY ANTERIOR APPROACH were presented and reviewed.  ?The risks of nonoperative treatment, versus surgical intervention including but not limited to continued pain, aseptic loosening, stiffness, dislocation/subluxation, infection, bleeding, nerve injury, blood clots, cardiopulmonary complications, morbidity, mortality, among others were discussed. The patient verbalizes understanding and  wishes to proceed with the plan.  ?Patient is being admitted for surgery, pain control, PT, prophylactic antibiotics, VTE prophylaxis, progressive ambulation, ADL's and discharge planning.  ? ?Dental prophyl

## 2022-02-01 NOTE — Progress Notes (Addendum)
Anesthesia Review: ? ?PCP: Beatrice Lecher  ?Cardiologist : none  ?Chest x-ray : ?EKG : 02/06/22  ?Echo : ?Stress test: ?Cardiac Cath :  ?Activity level: can do a flight of stiars without difficulty  ?Sleep Study/ CPAP : non e ?Fasting Blood Sugar :      / Checks Blood Sugar -- times a day:   ?Blood Thinner/ Instructions /Last Dose: ?ASA / Instructions/ Last Dose :   ?

## 2022-02-01 NOTE — Progress Notes (Signed)
DUE TO COVID-19 ONLY  2 VISITOR IS ALLOWED TO COME WITH YOU AND STAY IN THE WAITING ROOM ONLY DURING PRE OP AND PROCEDURE DAY OF SURGERY.   4  VISITOR  MAY VISIT WITH YOU AFTER SURGERY IN YOUR PRIVATE ROOM DURING VISITING HOURS ONLY! ?YOU MAY HAVE ONE PERSON SPEND THE NITE WITH YOU IN YOUR ROOM AFTER SURGERY.   ? ? Your procedure is scheduled on:  ?          02/14/2022  ? Report to [facility] Main  Entrance ? ? Report to admitting at       0715            AM ?DO NOT [place], PICTURE ID OR WALLET DAY OF SURGERY.  ?  ? ? Call this number if you have problems the morning of surgery [phone]  ? ? REMEMBER: NO  SOLID FOODS , CANDY, GUM OR MINTS AFTER MIDNITE THE NITE BEFORE SURGERY .       Marland Kitchen CLEAR LIQUIDS UNTIL    0700am            DAY OF SURGERY.      PLEASE FINISH ENSURE DRINK PER SURGEON ORDER  WHICH NEEDS TO BE COMPLETED AT    0700am      MORNING OF SURGERY.   ? ? ? ? ?CLEAR LIQUID DIET ? ? ?Foods Allowed      ?WATER ?BLACK COFFEE ( SUGAR OK, NO MILK, CREAM OR CREAMER) REGULAR AND DECAF  ?TEA ( SUGAR OK NO MILK, CREAM, OR CREAMER) REGULAR AND DECAF  ?PLAIN JELLO ( NO RED)  ?FRUIT ICES ( NO RED, NO FRUIT PULP)  ?POPSICLES ( NO RED)  ?JUICE- APPLE, WHITE GRAPE AND WHITE CRANBERRY  ?SPORT DRINK LIKE GATORADE ( NO RED)  ?CLEAR BROTH ( VEGETABLE , CHICKEN OR BEEF)                                                               ? ?    ? ?BRUSH YOUR TEETH MORNING OF SURGERY AND RINSE YOUR MOUTH OUT, NO CHEWING GUM CANDY OR MINTS. ?  ? ? Take these medicines the morning of surgery with A SIP OF WATER: gabapentin, nasal spray, toprol  ? ? ?DO NOT TAKE ANY DIABETIC MEDICATIONS DAY OF YOUR SURGERY ?                  ?            You may not have any metal on your body including hair pins and  ?            piercings  Do not wear jewelry, make-up, lotions, powders or perfumes, deodorant ?            Do not wear nail polish on your fingernails.   ?           IF YOU ARE A MALE AND WANT TO SHAVE UNDER ARMS  OR LEGS PRIOR TO SURGERY YOU MUST DO SO AT LEAST 48 HOURS PRIOR TO SURGERY.  ?            Men may shave face and neck. ? ? Do not bring valuables to the hospital. [facility] NOT ?  RESPONSIBLE   FOR VALUABLES. ? Contacts, dentures or bridgework may not be worn into surgery. ? Leave suitcase in the car. After surgery it may be brought to your room. ? ?  ? Patients discharged the day of surgery will not be allowed to drive home. IF YOU ARE HAVING SURGERY AND GOING HOME THE SAME DAY, YOU MUST HAVE AN ADULT TO DRIVE YOU HOME AND BE WITH YOU FOR 24 HOURS. YOU MAY GO HOME BY TAXI OR UBER OR ORTHERWISE, BUT AN ADULT MUST ACCOMPANY YOU HOME AND STAY WITH YOU FOR 24 HOURS. ?  ? ?            Please read over the following fact sheets you were given: ?_____________________________________________________________________ ? ?[facility] - Preparing for Surgery ?Before surgery, you can play an important role.  Because skin is not sterile, your skin needs to be as free of germs as possible.  You can reduce the number of germs on your skin by washing with CHG (chlorahexidine gluconate) soap before surgery.  CHG is an antiseptic cleaner which kills germs and bonds with the skin to continue killing germs even after washing. ?Please DO NOT use if you have an allergy to CHG or antibacterial soaps.  If your skin becomes reddened/irritated stop using the CHG and inform your nurse when you arrive at Short Stay. ?Do not shave (including legs and underarms) for at least 48 hours prior to the first CHG shower.  You may shave your face/neck. ?Please follow these instructions carefully: ? 1.  Shower with CHG Soap the night before surgery and the  morning of Surgery. ? 2.  If you choose to wash your hair, wash your hair first as usual with your  normal  shampoo. ? 3.  After you shampoo, rinse your hair and body thoroughly to remove the  shampoo.                           4.  Use CHG as you would any other liquid soap.  You can  apply chg directly  to the skin and wash  ?                     Gently with a scrungie or clean washcloth. ? 5.  Apply the CHG Soap to your body ONLY FROM THE NECK DOWN.   Do not use on face/ open      ?                     Wound or open sores. Avoid contact with eyes, ears mouth and genitals (private parts).  ?                     Production manager,  Genitals (private parts) with your normal soap. ?            6.  Wash thoroughly, paying special attention to the area where your surgery  will be performed. ? 7.  Thoroughly rinse your body with warm water from the neck down. ? 8.  DO NOT shower/wash with your normal soap after using and rinsing off  the CHG Soap. ?               9.  Pat yourself dry with a clean towel. ?           10.  Wear clean pajamas. ?  11.  Place clean sheets on your bed the night of your first shower and do not  sleep with pets. ?Day of Surgery : ?Do not apply any lotions/deodorants the morning of surgery.  Please wear clean clothes to the hospital/surgery center. ? ?FAILURE TO FOLLOW THESE INSTRUCTIONS MAY RESULT IN THE CANCELLATION OF YOUR SURGERY ?PATIENT SIGNATURE_________________________________ ? ?NURSE SIGNATURE__________________________________ ? ?________________________________________________________________________  ? ? ?           ?

## 2022-02-06 ENCOUNTER — Encounter (HOSPITAL_COMMUNITY): Payer: Self-pay

## 2022-02-06 ENCOUNTER — Encounter (HOSPITAL_COMMUNITY)
Admission: RE | Admit: 2022-02-06 | Discharge: 2022-02-06 | Disposition: A | Discharge status: Home / Self Care | Payer: Medicare Other | Source: Ambulatory Visit | Attending: Orthopedic Surgery | Admitting: Orthopedic Surgery

## 2022-02-06 ENCOUNTER — Other Ambulatory Visit: Payer: Self-pay | Admitting: Family Medicine

## 2022-02-06 ENCOUNTER — Other Ambulatory Visit: Payer: Self-pay

## 2022-02-06 VITALS — BP 121/76 | HR 68 | Temp 98.1°F | Resp 16 | Ht 72.0 in | Wt 177.0 lb

## 2022-02-06 DIAGNOSIS — Z01818 Encounter for other preprocedural examination: Secondary | ICD-10-CM | POA: Diagnosis present

## 2022-02-06 HISTORY — DX: Essential (primary) hypertension: I10

## 2022-02-06 HISTORY — DX: Personal history of other diseases of the digestive system: Z87.19

## 2022-02-06 HISTORY — DX: Gastro-esophageal reflux disease without esophagitis: K21.9

## 2022-02-06 LAB — CBC
HCT: 40.3 % (ref 39.0–52.0)
Hemoglobin: 13.6 g/dL (ref 13.0–17.0)
MCH: 34.2 pg — ABNORMAL HIGH (ref 26.0–34.0)
MCHC: 33.7 g/dL (ref 30.0–36.0)
MCV: 101.3 fL — ABNORMAL HIGH (ref 80.0–100.0)
Platelets: 225 10*3/uL (ref 150–400)
RBC: 3.98 MIL/uL — ABNORMAL LOW (ref 4.22–5.81)
RDW: 12.9 % (ref 11.5–15.5)
WBC: 8.5 10*3/uL (ref 4.0–10.5)
nRBC: 0 % (ref 0.0–0.2)

## 2022-02-06 LAB — BASIC METABOLIC PANEL
Anion gap: 6 (ref 5–15)
BUN: 21 mg/dL (ref 8–23)
CO2: 26 mmol/L (ref 22–32)
Calcium: 8.9 mg/dL (ref 8.9–10.3)
Chloride: 107 mmol/L (ref 98–111)
Creatinine, Ser: 1.32 mg/dL — ABNORMAL HIGH (ref 0.61–1.24)
GFR, Estimated: 60 mL/min — ABNORMAL LOW (ref >60)
Glucose, Bld: 108 mg/dL — ABNORMAL HIGH (ref 70–99)
Potassium: 4.2 mmol/L (ref 3.5–5.1)
Sodium: 139 mmol/L (ref 135–145)

## 2022-02-06 LAB — SURGICAL PCR SCREEN
MRSA, PCR: NEGATIVE
Staphylococcus aureus: NEGATIVE

## 2022-02-13 ENCOUNTER — Encounter (HOSPITAL_COMMUNITY): Payer: Self-pay | Admitting: Orthopedic Surgery

## 2022-02-13 NOTE — Anesthesia Preprocedure Evaluation (Addendum)
Anesthesia Evaluation  ?Patient identified by MRN, date of birth, ID band ?Patient awake ? ? ? ?Reviewed: ?Allergy & Precautions, NPO status , Patient's Chart, lab work & pertinent test results, reviewed documented beta blocker date and time  ? ?Airway ?Mallampati: II ? ?TM Distance: >3 FB ?Neck ROM: Full ? ? ? Dental ?no notable dental hx. ?(+) Teeth Intact, Caps, Dental Advisory Given ?  ?Pulmonary ?Current Smoker,  ?snores ?  ?Pulmonary exam normal ?breath sounds clear to auscultation ? ? ? ? ? ? Cardiovascular ?hypertension, Normal cardiovascular exam ?Rhythm:Regular Rate:Normal ? ? ?  ?Neuro/Psych ? Neuromuscular disease negative psych ROS  ? GI/Hepatic ?hiatal hernia, GERD  Medicated and Controlled,Fatty liver ?  ?Endo/Other  ?negative endocrine ROSHyperlipidemia ? Renal/GU ?negative Renal ROS  ? ?BPH ? ?  ?Musculoskeletal ? ?(+) Arthritis , Osteoarthritis,  Ankylosing spondylitis ?S/P lumbar fusion ?Cervical and lumbar radiculopathy ?DJD right hip  ? Abdominal ?  ?Peds ? Hematology ?negative hematology ROS ?(+)   ?Anesthesia Other Findings ? ? Reproductive/Obstetrics ? ?  ? ? ? ? ? ? ? ? ? ? ? ? ? ?  ?  ? ? ? ? ? ? ? ?Anesthesia Physical ?Anesthesia Plan ? ?ASA: 2 ? ?Anesthesia Plan:   ? ?Post-op Pain Management:   ? ?Induction:  ? ?PONV Risk Score and Plan: 1 and Treatment may vary due to age or medical condition ? ?Airway Management Planned: Natural Airway and Simple Face Mask ? ?Additional Equipment: None ? ?Intra-op Plan:  ? ?Post-operative Plan:  ? ?Informed Consent: I have reviewed the patients History and Physical, chart, labs and discussed the procedure including the risks, benefits and alternatives for the proposed anesthesia with the patient or authorized representative who has indicated his/her understanding and acceptance.  ? ? ? ?Dental advisory given ? ?Plan Discussed with: CRNA and Anesthesiologist ? ?Anesthesia Plan Comments:   ? ? ? ? ? ?Anesthesia Quick  Evaluation ? ?

## 2022-02-14 ENCOUNTER — Other Ambulatory Visit: Payer: Self-pay

## 2022-02-14 ENCOUNTER — Ambulatory Visit (HOSPITAL_COMMUNITY): Payer: Medicare Other

## 2022-02-14 ENCOUNTER — Ambulatory Visit (HOSPITAL_COMMUNITY): Payer: Medicare Other | Admitting: Certified Registered"

## 2022-02-14 ENCOUNTER — Encounter (HOSPITAL_COMMUNITY): Payer: Self-pay | Admitting: Orthopedic Surgery

## 2022-02-14 ENCOUNTER — Encounter (HOSPITAL_COMMUNITY)
Admission: RE | Disposition: A | Discharge status: Home / Self Care | Payer: Self-pay | Source: Home / Self Care | Attending: Orthopedic Surgery

## 2022-02-14 ENCOUNTER — Ambulatory Visit (HOSPITAL_COMMUNITY)
Admission: RE | Admit: 2022-02-14 | Discharge: 2022-02-14 | Disposition: A | Discharge status: Home / Self Care | Payer: Medicare Other | Attending: Orthopedic Surgery | Admitting: Orthopedic Surgery

## 2022-02-14 ENCOUNTER — Ambulatory Visit (HOSPITAL_BASED_OUTPATIENT_CLINIC_OR_DEPARTMENT_OTHER): Payer: Medicare Other | Admitting: Certified Registered"

## 2022-02-14 DIAGNOSIS — M1611 Unilateral primary osteoarthritis, right hip: Secondary | ICD-10-CM | POA: Insufficient documentation

## 2022-02-14 DIAGNOSIS — M459 Ankylosing spondylitis of unspecified sites in spine: Secondary | ICD-10-CM | POA: Diagnosis not present

## 2022-02-14 DIAGNOSIS — F17210 Nicotine dependence, cigarettes, uncomplicated: Secondary | ICD-10-CM | POA: Insufficient documentation

## 2022-02-14 DIAGNOSIS — I10 Essential (primary) hypertension: Secondary | ICD-10-CM | POA: Diagnosis not present

## 2022-02-14 DIAGNOSIS — K219 Gastro-esophageal reflux disease without esophagitis: Secondary | ICD-10-CM | POA: Diagnosis not present

## 2022-02-14 DIAGNOSIS — M25551 Pain in right hip: Secondary | ICD-10-CM | POA: Diagnosis present

## 2022-02-14 DIAGNOSIS — Z96641 Presence of right artificial hip joint: Secondary | ICD-10-CM

## 2022-02-14 HISTORY — PX: TOTAL HIP ARTHROPLASTY: SHX124

## 2022-02-14 LAB — TYPE AND SCREEN
ABO/RH(D): O POS
Antibody Screen: NEGATIVE

## 2022-02-14 SURGERY — ARTHROPLASTY, HIP, TOTAL, ANTERIOR APPROACH
Anesthesia: Spinal | Site: Hip | Laterality: Right

## 2022-02-14 MED ORDER — 0.9 % SODIUM CHLORIDE (POUR BTL) OPTIME
TOPICAL | Status: DC | PRN
Start: 2022-02-14 — End: 2022-02-14
  Administered 2022-02-14: 1000 mL

## 2022-02-14 MED ORDER — TRAMADOL HCL 50 MG PO TABS: 50.0000 mg | ORAL_TABLET | Freq: Four times a day (QID) | ORAL | Status: DC

## 2022-02-14 MED ORDER — METHOCARBAMOL 750 MG PO TABS: 750.0000 mg | ORAL_TABLET | Freq: Three times a day (TID) | ORAL | 0 refills | Status: DC | PRN

## 2022-02-14 MED ORDER — HYDROMORPHONE HCL 1 MG/ML IJ SOLN
INTRAMUSCULAR | Status: AC
  Filled 2022-02-14: qty 1

## 2022-02-14 MED ORDER — SODIUM CHLORIDE FLUSH 0.9 % IV SOLN
INTRAVENOUS | Status: DC | PRN
  Administered 2022-02-14: 10 mL

## 2022-02-14 MED ORDER — FENTANYL CITRATE (PF) 100 MCG/2ML IJ SOLN
INTRAMUSCULAR | Status: DC | PRN
Start: 2022-02-14 — End: 2022-02-14
  Administered 2022-02-14: 50 ug via INTRAVENOUS
  Administered 2022-02-14 (×2): 25 ug via INTRAVENOUS

## 2022-02-14 MED ORDER — WATER FOR IRRIGATION, STERILE IR SOLN
Status: DC | PRN
Start: 2022-02-14 — End: 2022-02-14
  Administered 2022-02-14: 20000 mL

## 2022-02-14 MED ORDER — ONDANSETRON HCL 4 MG/2ML IJ SOLN
INTRAMUSCULAR | Status: AC
  Filled 2022-02-14: qty 2

## 2022-02-14 MED ORDER — BUPIVACAINE LIPOSOME 1.3 % IJ SUSP: 10.0000 mL | Freq: Once | INTRAMUSCULAR | Status: DC

## 2022-02-14 MED ORDER — ONDANSETRON 4 MG PO TBDP: 4.0000 mg | ORAL_TABLET | Freq: Two times a day (BID) | ORAL | 0 refills | Status: DC | PRN

## 2022-02-14 MED ORDER — BUPIVACAINE IN DEXTROSE 0.75-8.25 % IT SOLN
INTRATHECAL | Status: DC | PRN
  Administered 2022-02-14: 1.8 mL via INTRATHECAL

## 2022-02-14 MED ORDER — MIDAZOLAM HCL 2 MG/2ML IJ SOLN
INTRAMUSCULAR | Status: DC | PRN
  Administered 2022-02-14: 2 mg via INTRAVENOUS

## 2022-02-14 MED ORDER — MICROFIBRILLAR COLL HEMOSTAT EX PADS
MEDICATED_PAD | CUTANEOUS | Status: DC | PRN
  Administered 2022-02-14: 1 via TOPICAL

## 2022-02-14 MED ORDER — EPHEDRINE SULFATE-NACL 50-0.9 MG/10ML-% IV SOSY
PREFILLED_SYRINGE | INTRAVENOUS | Status: DC | PRN
  Administered 2022-02-14: 5 mg via INTRAVENOUS
  Administered 2022-02-14: 10 mg via INTRAVENOUS
  Administered 2022-02-14: 5 mg via INTRAVENOUS

## 2022-02-14 MED ORDER — SODIUM CHLORIDE (PF) 0.9 % IJ SOLN
INTRAMUSCULAR | Status: AC
  Filled 2022-02-14: qty 20

## 2022-02-14 MED ORDER — TRANEXAMIC ACID-NACL 1000-0.7 MG/100ML-% IV SOLN
INTRAVENOUS | Status: AC
  Administered 2022-02-14: 1000 mg via INTRAVENOUS
  Filled 2022-02-14: qty 100

## 2022-02-14 MED ORDER — CEFAZOLIN SODIUM-DEXTROSE 2-4 GM/100ML-% IV SOLN
INTRAVENOUS | Status: AC
  Filled 2022-02-14: qty 100

## 2022-02-14 MED ORDER — CEFAZOLIN SODIUM-DEXTROSE 2-4 GM/100ML-% IV SOLN
2.0000 g | INTRAVENOUS | Status: AC
  Administered 2022-02-14: 2 g via INTRAVENOUS
  Filled 2022-02-14: qty 100

## 2022-02-14 MED ORDER — HYDROMORPHONE HCL 1 MG/ML IJ SOLN: 0.2500 mg | INTRAMUSCULAR | Status: DC | PRN

## 2022-02-14 MED ORDER — ONDANSETRON HCL 4 MG/2ML IJ SOLN: 4.0000 mg | Freq: Once | INTRAMUSCULAR | Status: DC | PRN

## 2022-02-14 MED ORDER — ORAL CARE MOUTH RINSE: 15.0000 mL | Freq: Once | OROMUCOSAL | Status: AC

## 2022-02-14 MED ORDER — ACETAMINOPHEN 500 MG PO TABS: 1000.0000 mg | ORAL_TABLET | Freq: Three times a day (TID) | ORAL | 0 refills | Status: DC | PRN

## 2022-02-14 MED ORDER — GLYCOPYRROLATE 0.2 MG/ML IJ SOLN
INTRAMUSCULAR | Status: AC
  Filled 2022-02-14: qty 1

## 2022-02-14 MED ORDER — TRANEXAMIC ACID-NACL 1000-0.7 MG/100ML-% IV SOLN: 1000.0000 mg | Freq: Once | INTRAVENOUS | Status: DC

## 2022-02-14 MED ORDER — MIDAZOLAM HCL 2 MG/2ML IJ SOLN
INTRAMUSCULAR | Status: AC
  Filled 2022-02-14: qty 2

## 2022-02-14 MED ORDER — LACTATED RINGERS IV SOLN: INTRAVENOUS | Status: DC

## 2022-02-14 MED ORDER — DEXAMETHASONE SODIUM PHOSPHATE 10 MG/ML IJ SOLN
8.0000 mg | Freq: Once | INTRAMUSCULAR | Status: AC
  Administered 2022-02-14: 8 mg via INTRAVENOUS

## 2022-02-14 MED ORDER — DEXAMETHASONE SODIUM PHOSPHATE 10 MG/ML IJ SOLN
INTRAMUSCULAR | Status: AC
  Filled 2022-02-14: qty 1

## 2022-02-14 MED ORDER — CHLORHEXIDINE GLUCONATE 0.12 % MT SOLN
15.0000 mL | Freq: Once | OROMUCOSAL | Status: AC
  Administered 2022-02-14: 15 mL via OROMUCOSAL

## 2022-02-14 MED ORDER — OXYCODONE HCL 5 MG PO TABS: 5.0000 mg | ORAL_TABLET | ORAL | Status: DC | PRN

## 2022-02-14 MED ORDER — METHOCARBAMOL 500 MG PO TABS: 500.0000 mg | ORAL_TABLET | Freq: Four times a day (QID) | ORAL | Status: DC | PRN

## 2022-02-14 MED ORDER — HYDROMORPHONE HCL 1 MG/ML IJ SOLN
0.5000 mg | INTRAMUSCULAR | Status: DC | PRN
  Administered 2022-02-14: 1 mg via INTRAVENOUS

## 2022-02-14 MED ORDER — LACTATED RINGERS IV BOLUS
250.0000 mL | Freq: Once | INTRAVENOUS | Status: AC
  Administered 2022-02-14: 250 mL via INTRAVENOUS

## 2022-02-14 MED ORDER — TRANEXAMIC ACID-NACL 1000-0.7 MG/100ML-% IV SOLN
1000.0000 mg | INTRAVENOUS | Status: AC
  Filled 2022-02-14: qty 100

## 2022-02-14 MED ORDER — FENTANYL CITRATE (PF) 100 MCG/2ML IJ SOLN
INTRAMUSCULAR | Status: AC
  Filled 2022-02-14: qty 2

## 2022-02-14 MED ORDER — EPHEDRINE 5 MG/ML INJ
INTRAVENOUS | Status: AC
  Filled 2022-02-14: qty 5

## 2022-02-14 MED ORDER — OXYCODONE HCL 5 MG/5ML PO SOLN: 5.0000 mg | Freq: Once | ORAL | Status: AC | PRN

## 2022-02-14 MED ORDER — ACETAMINOPHEN 500 MG PO TABS
1000.0000 mg | ORAL_TABLET | Freq: Once | ORAL | Status: AC
  Administered 2022-02-14: 1000 mg via ORAL
  Filled 2022-02-14: qty 2

## 2022-02-14 MED ORDER — ASPIRIN EC 81 MG PO TBEC: 81.0000 mg | DELAYED_RELEASE_TABLET | Freq: Two times a day (BID) | ORAL | 0 refills | Status: AC

## 2022-02-14 MED ORDER — GLYCOPYRROLATE 0.2 MG/ML IJ SOLN
INTRAMUSCULAR | Status: DC | PRN
  Administered 2022-02-14: .2 mg via INTRAVENOUS

## 2022-02-14 MED ORDER — BUPIVACAINE LIPOSOME 1.3 % IJ SUSP
INTRAMUSCULAR | Status: AC
  Filled 2022-02-14: qty 10

## 2022-02-14 MED ORDER — METHOCARBAMOL 500 MG IVPB - SIMPLE MED: 500.0000 mg | Freq: Four times a day (QID) | INTRAVENOUS | Status: DC | PRN

## 2022-02-14 MED ORDER — CEFAZOLIN SODIUM-DEXTROSE 2-4 GM/100ML-% IV SOLN
2.0000 g | Freq: Four times a day (QID) | INTRAVENOUS | Status: DC
  Administered 2022-02-14: 2 g via INTRAVENOUS

## 2022-02-14 MED ORDER — LACTATED RINGERS IV BOLUS
500.0000 mL | Freq: Once | INTRAVENOUS | Status: AC
  Administered 2022-02-14: 500 mL via INTRAVENOUS

## 2022-02-14 MED ORDER — HYDROMORPHONE HCL 2 MG PO TABS: 2.0000 mg | ORAL_TABLET | ORAL | Status: DC | PRN

## 2022-02-14 MED ORDER — PROPOFOL 1000 MG/100ML IV EMUL
INTRAVENOUS | Status: AC
  Filled 2022-02-14: qty 100

## 2022-02-14 MED ORDER — METHOCARBAMOL 500 MG PO TABS
ORAL_TABLET | ORAL | Status: AC
  Administered 2022-02-14: 500 mg via ORAL
  Filled 2022-02-14: qty 1

## 2022-02-14 MED ORDER — PROPOFOL 10 MG/ML IV BOLUS
INTRAVENOUS | Status: AC
  Filled 2022-02-14: qty 20

## 2022-02-14 MED ORDER — POVIDONE-IODINE 10 % EX SWAB: 2.0000 "application " | Freq: Once | CUTANEOUS | Status: DC

## 2022-02-14 MED ORDER — PHENYLEPHRINE HCL-NACL 20-0.9 MG/250ML-% IV SOLN
INTRAVENOUS | Status: DC | PRN
  Administered 2022-02-14: 40 ug/min via INTRAVENOUS

## 2022-02-14 MED ORDER — ROCURONIUM BROMIDE 10 MG/ML (PF) SYRINGE
PREFILLED_SYRINGE | INTRAVENOUS | Status: AC
  Filled 2022-02-14: qty 10

## 2022-02-14 MED ORDER — OXYCODONE HCL 5 MG PO TABS: 5.0000 mg | ORAL_TABLET | Freq: Once | ORAL | Status: AC | PRN

## 2022-02-14 MED ORDER — OXYCODONE HCL 5 MG PO TABS
5.0000 mg | ORAL_TABLET | Freq: Four times a day (QID) | ORAL | 0 refills | Status: DC | PRN
Start: 2022-02-14 — End: 2022-03-28

## 2022-02-14 MED ORDER — POVIDONE-IODINE 10 % EX SWAB
2.0000 "application " | Freq: Once | CUTANEOUS | Status: AC
  Administered 2022-02-14: 2 via TOPICAL

## 2022-02-14 MED ORDER — ONDANSETRON HCL 4 MG/2ML IJ SOLN
INTRAMUSCULAR | Status: DC | PRN
  Administered 2022-02-14: 4 mg via INTRAVENOUS

## 2022-02-14 MED ORDER — MICROFIBRILLAR COLL HEMOSTAT EX PADS: MEDICATED_PAD | CUTANEOUS | Status: DC | PRN

## 2022-02-14 MED ORDER — BUPIVACAINE LIPOSOME 1.3 % IJ SUSP
INTRAMUSCULAR | Status: DC | PRN
Start: 2022-02-14 — End: 2022-02-14
  Administered 2022-02-14: 10 mL

## 2022-02-14 MED ORDER — PROPOFOL 500 MG/50ML IV EMUL
INTRAVENOUS | Status: DC | PRN
Start: 2022-02-14 — End: 2022-02-14
  Administered 2022-02-14: 75 ug/kg/min via INTRAVENOUS

## 2022-02-14 MED ORDER — OXYCODONE HCL 5 MG PO TABS
ORAL_TABLET | ORAL | Status: AC
  Administered 2022-02-14: 5 mg via ORAL
  Filled 2022-02-14: qty 1

## 2022-02-14 SURGICAL SUPPLY — 49 items
APL PRP STRL LF DISP 70% ISPRP (MISCELLANEOUS) ×1
BAG COUNTER SPONGE SURGICOUNT (BAG) IMPLANT
BAG SPEC THK2 15X12 ZIP CLS (MISCELLANEOUS)
BAG SPNG CNTER NS LX DISP (BAG)
BAG ZIPLOCK 12X15 (MISCELLANEOUS) IMPLANT
BLADE SAG 18X100X1.27 (BLADE) ×2 IMPLANT
BLADE SURG SZ10 CARB STEEL (BLADE) ×2 IMPLANT
CHLORAPREP W/TINT 26 (MISCELLANEOUS) ×2 IMPLANT
CLSR STERI-STRIP ANTIMIC 1/2X4 (GAUZE/BANDAGES/DRESSINGS) ×2 IMPLANT
COVER PERINEAL POST (MISCELLANEOUS) ×2 IMPLANT
COVER SURGICAL LIGHT HANDLE (MISCELLANEOUS) ×2 IMPLANT
DRAPE IMP U-DRAPE 54X76 (DRAPES) ×2 IMPLANT
DRAPE STERI IOBAN 125X83 (DRAPES) ×2 IMPLANT
DRAPE U-SHAPE 47X51 STRL (DRAPES) ×4 IMPLANT
DRSG MEPILEX BORDER 4X8 (GAUZE/BANDAGES/DRESSINGS) ×2 IMPLANT
ELECT REM PT RETURN 15FT ADLT (MISCELLANEOUS) ×2 IMPLANT
GLOVE BIO SURGEON STRL SZ7.5 (GLOVE) ×2 IMPLANT
GLOVE BIOGEL PI IND STRL 7.5 (GLOVE) ×1 IMPLANT
GLOVE BIOGEL PI IND STRL 8 (GLOVE) ×1 IMPLANT
GLOVE BIOGEL PI INDICATOR 7.5 (GLOVE) ×1
GLOVE BIOGEL PI INDICATOR 8 (GLOVE) ×1
GLOVE SURG SYN 7.5  E (GLOVE) ×2
GLOVE SURG SYN 7.5 E (GLOVE) ×1 IMPLANT
GLOVE SURG SYN 7.5 PF PI (GLOVE) ×1 IMPLANT
GOWN SPEC L4 XLG W/TWL (GOWN DISPOSABLE) ×2 IMPLANT
GOWN STRL REUS W/ TWL LRG LVL3 (GOWN DISPOSABLE) ×1 IMPLANT
GOWN STRL REUS W/TWL LRG LVL3 (GOWN DISPOSABLE) ×2
HEAD BIOLOX HIP 36/-2.5 (Joint) IMPLANT
HEMOSTAT SURGICEL 4X8 (HEMOSTASIS) ×1 IMPLANT
HIP BIOLOX HD 36/-2.5 (Joint) ×2 IMPLANT
HOLDER FOLEY CATH W/STRAP (MISCELLANEOUS) IMPLANT
INSERT TRIDENT POLY 36 0DEG (Insert) ×1 IMPLANT
KIT TURNOVER KIT A (KITS) IMPLANT
MANIFOLD NEPTUNE II (INSTRUMENTS) ×2 IMPLANT
NS IRRIG 1000ML POUR BTL (IV SOLUTION) ×2 IMPLANT
PACK ANTERIOR HIP CUSTOM (KITS) ×2 IMPLANT
PROTECTOR NERVE ULNAR (MISCELLANEOUS) ×2 IMPLANT
SCREW HEX LP 6.5X20 (Screw) ×1 IMPLANT
SHELL ACETABUL CLUSTER SZ 54 (Shell) ×1 IMPLANT
SPIKE FLUID TRANSFER (MISCELLANEOUS) ×4 IMPLANT
STEM HIP 127 DEG (Stem) ×1 IMPLANT
SUT MNCRL AB 3-0 PS2 18 (SUTURE) ×2 IMPLANT
SUT VIC AB 0 CT1 36 (SUTURE) ×2 IMPLANT
SUT VIC AB 1 CT1 36 (SUTURE) ×2 IMPLANT
SUT VIC AB 2-0 CT1 27 (SUTURE) ×4
SUT VIC AB 2-0 CT1 TAPERPNT 27 (SUTURE) ×2 IMPLANT
TRAY FOLEY MTR SLVR 16FR STAT (SET/KITS/TRAYS/PACK) IMPLANT
TUBE SUCTION HIGH CAP CLEAR NV (SUCTIONS) ×2 IMPLANT
WATER STERILE IRR 1000ML POUR (IV SOLUTION) ×4 IMPLANT

## 2022-02-14 NOTE — Discharge Instructions (Signed)

## 2022-02-14 NOTE — Anesthesia Postprocedure Evaluation (Signed)
Anesthesia Post Note ? ?Patient: Raymond Mitchell ? ?Procedure(s) Performed: TOTAL HIP ARTHROPLASTY ANTERIOR APPROACH (Right: Hip) ? ?  ? ?Patient location during evaluation: PACU ?Anesthesia Type: Spinal ?Level of consciousness: oriented and awake and alert ?Pain management: pain level controlled ?Vital Signs Assessment: post-procedure vital signs reviewed and stable ?Respiratory status: spontaneous breathing, respiratory function stable and nonlabored ventilation ?Cardiovascular status: blood pressure returned to baseline and stable ?Postop Assessment: no headache, no backache, no apparent nausea or vomiting, spinal receding and patient able to bend at knees ?Anesthetic complications: no ? ? ?No notable events documented. ? ?Last Vitals:  ?Vitals:  ? 02/14/22 1000 02/14/22 1015  ?BP: 111/72 106/71  ?Pulse: (!) 59 (!) 55  ?Resp: 14 11  ?Temp:    ?SpO2: 91% 93%  ?  ?Last Pain:  ?Vitals:  ? 02/14/22 1015  ?TempSrc:   ?PainSc: 0-No pain  ? ? ?LLE Motor Response: Purposeful movement (muscle flicker) (98/92/11 9417) ?  ?RLE Motor Response: Purposeful movement (muscle flicker) (40/81/44 8185) ?  ?L Sensory Level: T12-Inguinal (groin) region (02/14/22 1015) ?R Sensory Level: T12-Inguinal (groin) region (02/14/22 1015) ? ?[name] A. ? ? ? ? ?

## 2022-02-14 NOTE — Op Note (Signed)
02/14/2022 ? ?8:47 AM ? ?PATIENT:  Raymond Mitchell  ? ?MRN: [id number] ? ?PRE-OPERATIVE DIAGNOSIS:  OA RIGHT HIP ? ?POST-OPERATIVE DIAGNOSIS:  OA RIGHT HIP ? ?PROCEDURE:  Procedure(s): ?TOTAL HIP ARTHROPLASTY ANTERIOR APPROACH ? ?PREOPERATIVE INDICATIONS:   ? ?Raymond Mitchell is an 66 y.o. male who has a diagnosis of <principal problem not specified> and elected for surgical management after failing conservative treatment.  The risks benefits and alternatives were discussed with the patient including but not limited to the risks of nonoperative treatment, versus surgical intervention including infection, bleeding, nerve injury, periprosthetic fracture, the need for revision surgery, dislocation, leg length discrepancy, blood clots, cardiopulmonary complications, morbidity, mortality, among others, and they were willing to proceed.   ? ? ?OPERATIVE REPORT  ?   ?SURGEON:   Renette Butters, MD ?   ?ASSISTANT:  Aggie Moats, PA-C, he was present and scrubbed throughout the case, critical for completion in a timely fashion, and for retraction, instrumentation, and closure. ? ?   ?ANESTHESIA:  General ?   ?COMPLICATIONS:  None.  ?   ?COMPONENTS:  Stryker acolade fit femur size 5 with a 36 mm -2.5 head ball and an acetabular shell size 54 with a  polyethylene liner ?   ?PROCEDURE IN DETAIL:  ? ?The patient was met in the holding area and  identified.  The appropriate hip was identified and marked at the operative site.  The patient was then transported to the OR  and  placed under anesthesia per that record.  At that point, the patient was  placed in the supine position and  secured to the operating room table and all bony prominences padded. He received pre-operative antibiotics ?   ?The operative lower extremity was prepped from the iliac crest to the distal leg.  Sterile draping was performed.  Time out was performed prior to incision.   ?   ?Skin incision was made just 2 cm lateral to the ASIS  extending in line  with the tensor fascia lata. Electrocautery was used to control all bleeders. I dissected down sharply to the fascia of the tensor fascia lata was confirmed that the muscle fibers beneath were running posteriorly. I then incised the fascia over the superficial tensor fascia lata in line with the incision. The fascia was elevated off the anterior aspect of the muscle the muscle was retracted posteriorly and protected throughout the case. I then used electrocautery to incise the tensor fascia lata fascia control and all bleeders. Immediately visible was the fat over top of the anterior neck and capsule. ? ?I removed the anterior fat from the capsule and elevated the rectus muscle off of the anterior capsule. I then removed a large time of capsule. The retractors were then placed over the anterior acetabulum as well as around the superior and inferior neck. ? ?I then made a femoral neck cut. Then used the power corkscrew to remove the femoral head from the acetabulum and thoroughly irrigated the acetabulum. I sized the femoral head. ?   ?I then exposed the deep acetabulum, cleared out any tissue including the ligamentum teres.   After adequate visualization, I excised the labrum, and then sequentially reamed.  I then impacted the acetabular implant into place using fluoroscopy for guidance.  Appropriate version and inclination was confirmed clinically matching their bony anatomy, and with fluoroscopy. ? ?I placed a 20 mm screw in the posterior/superio position with an excellent bite.  ? ? I then placed the polyethylene liner in  place ? ?I then adducted the leg and released the external rotators from the posterior femur allowing it to be easily delivered up lateral and anterior to the acetabulum for preparation of the femoral canal. ?   ?I then prepared the proximal femur using the cookie-cutter and then sequentially reamed and broached. ? ?A trial broach, neck, and head was utilized, and I reduced the hip and used  floroscopy to assess the neck length and femoral implant. ? ?I then impacted the femoral prosthesis into place into the appropriate version. The hip was then reduced and fluoroscopy confirmed appropriate position. Leg lengths were restored. ? ?I then irrigated the hip copiously again with, and repaired the fascia with Vicryl, followed by monocryl for the subcutaneous tissue, Monocryl for the skin, Steri-Strips and sterile gauze. The patient was then awakened and returned to PACU in stable and satisfactory condition. There were no complications. ? ?POST OPERATIVE PLAN: WBAT, DVT px: SCD's/TED, ambulation and chemical dvt px ? ?Edmonia Lynch, MD ?Orthopedic Surgeon ?[phone]  ? ? ? ?

## 2022-02-14 NOTE — Anesthesia Procedure Notes (Signed)
Spinal ? ?Patient location during procedure: OR ?Start time: 02/14/2022 7:17 AM ?End time: 02/14/2022 7:21 AM ?Reason for block: surgical anesthesia ?Staffing ?Performed: anesthesiologist  ?Anesthesiologist: Josephine Igo, MD ?Preanesthetic Checklist ?Completed: patient identified, IV checked, site marked, risks and benefits discussed, surgical consent, monitors and equipment checked, pre-op evaluation and timeout performed ?Spinal Block ?Patient position: sitting ?Prep: DuraPrep and site prepped and draped ?Patient monitoring: heart rate, cardiac monitor, continuous pulse ox and blood pressure ?Approach: midline ?Location: L3-4 ?Injection technique: single-shot ?Needle ?Needle type: Pencan  ?Needle gauge: 24 G ?Needle length: 9 cm ?Needle insertion depth: 6 cm ?Assessment ?Sensory level: T6 ?Events: CSF return ?Additional Notes ?Patient tolerated procedure well. Adequate sensory level. ? ? ? ? ? ?

## 2022-02-14 NOTE — Evaluation (Signed)
Physical Therapy Evaluation ?Patient Details ?Name: Raymond Mitchell ?MRN: [id number] ?DOB: 1956/03/02 ?Today's Date: 02/14/2022 ? ?History of Present Illness ? Pt is a 66yo male presenting s/p R-THA, AA on 02/14/22. PMH: ankylosing spondylitis, GERD, HTN, L-RCR 2005, L4-L5 fusion 2009.  ?Clinical Impression ? Raymond Mitchell is a 66 y.o. male POD 0 s/p R-THA, AA. Patient reports independence with mobility at baseline. Patient is now limited by functional impairments (see PT problem list below) and requires min guard for transfers and gait with RW. Patient was able to ambulate 60 feet with RW and min guard and cues for safe walker management. Patient instructed in exercises to facilitate ROM and circulation. Patient will benefit from continued skilled PT interventions to address impairments and progress towards PLOF. Patient has met mobility goals at adequate level for discharge home; will continue to follow if pt continues acute stay to progress towards Mod I goals.   ?   ? ?Recommendations for follow up therapy are one component of a multi-disciplinary discharge planning process, led by the attending physician.  Recommendations may be updated based on patient status, additional functional criteria and insurance authorization. ? ?Follow Up Recommendations Follow physician's recommendations for discharge plan and follow up therapies ? ?  ?Assistance Recommended at Discharge Set up Supervision/Assistance  ?Patient can return home with the following ? A little help with walking and/or transfers;A little help with bathing/dressing/bathroom;Assistance with cooking/housework;Assist for transportation;Help with stairs or ramp for entrance ? ?  ?Equipment Recommendations None recommended by PT  ?Recommendations for Other Services ?    ?  ?Functional Status Assessment Patient has had a recent decline in their functional status and demonstrates the ability to make significant improvements in function in a reasonable and  predictable amount of time.  ? ?  ?Precautions / Restrictions Precautions ?Precautions: None ?Restrictions ?Weight Bearing Restrictions: No ?Other Position/Activity Restrictions: WBAT  ? ?  ? ?Mobility ? Bed Mobility ?Overal bed mobility: Needs Assistance ?Bed Mobility: Supine to Sit ?  ?  ?Supine to sit: Supervision ?  ?  ?General bed mobility comments: For safety only ?  ? ?Transfers ?Overall transfer level: Needs assistance ?Equipment used: Rolling walker (2 wheels) ?Transfers: Sit to/from Stand ?Sit to Stand: Min guard ?  ?  ?  ?  ?  ?General transfer comment: For safety only, no physical assist required, VCs for sequencing. ?  ? ?Ambulation/Gait ?Ambulation/Gait assistance: Min guard ?Gait Distance (Feet): 60 Feet ?Assistive device: Rolling walker (2 wheels) ?Gait Pattern/deviations: Step-through pattern ?Gait velocity: decreased ?  ?  ?General Gait Details: Pt ambulated with RW and min guard assist, no physical assist required or overt LOB noted. Demonstrated step-through pattern with verbal cues for proximity to device and reducing stride length for safety. ? ?Stairs ?  ?  ?  ?  ?  ? ?Wheelchair Mobility ?  ? ?Modified Rankin (Stroke Patients Only) ?  ? ?  ? ?Balance Overall balance assessment: Needs assistance ?Sitting-balance support: Feet supported, No upper extremity supported ?Sitting balance-Leahy Scale: Fair ?  ?  ?Standing balance support: Reliant on assistive device for balance, During functional activity, Bilateral upper extremity supported ?Standing balance-Leahy Scale: Poor ?  ?  ?  ?  ?  ?  ?  ?  ?  ?  ?  ?  ?   ? ? ? ?Pertinent Vitals/Pain Pain Assessment ?Pain Assessment: 0-10 ?Pain Score: 5  ?Pain Location: right hip ?Pain Descriptors / Indicators: Operative site guarding, Discomfort ?Pain Intervention(s): Monitored during  session, Repositioned, Ice applied  ? ? ?Home Living Family/patient expects to be discharged to:: Private residence ?Living Arrangements: Spouse/significant other ?Available  Help at Discharge: Family;Available 24 hours/day ?Type of Home: House ?Home Access: Level entry ?  ?  ?  ?Home Layout: One level ?Home Equipment: Rolling Walker (2 wheels);Grab bars - tub/shower;Shower seat - built in ?   ?  ?Prior Function Prior Level of Function : Independent/Modified Independent ?  ?  ?  ?  ?  ?  ?Mobility Comments: IND ?ADLs Comments: IND ?  ? ? ?Hand Dominance  ?   ? ?  ?Extremity/Trunk Assessment  ? Upper Extremity Assessment ?Upper Extremity Assessment: Overall WFL for tasks assessed ?  ? ?Lower Extremity Assessment ?Lower Extremity Assessment: RLE deficits/detail;LLE deficits/detail ?RLE Deficits / Details: MMT ank PF/DF 5/5 ?RLE Sensation: WNL ?LLE Deficits / Details: MMT ank PF/DF 5/5 ?LLE Sensation: WNL ?  ? ?Cervical / Trunk Assessment ?Cervical / Trunk Assessment: Normal  ?Communication  ? Communication: HOH;No difficulties  ?Cognition Arousal/Alertness: Awake/alert ?Behavior During Therapy: [facility] for tasks assessed/performed ?Overall Cognitive Status: Within Functional Limits for tasks assessed ?  ?  ?  ?  ?  ?  ?  ?  ?  ?  ?  ?  ?  ?  ?  ?  ?  ?  ?  ? ?  ?General Comments   ? ?  ?Exercises Total Joint Exercises ?Ankle Circles/Pumps: AROM, 20 reps, Both ?Quad Sets: AROM, Both, 5 reps ?Short Arc Quad: AROM, Right, 5 reps ?Heel Slides: AROM, Right, 5 reps ?Hip ABduction/ADduction: AROM, Right, 5 reps  ? ?Assessment/Plan  ?  ?PT Assessment Patient needs continued PT services  ?PT Problem List Decreased strength;Decreased range of motion;Decreased balance;Decreased activity tolerance;Decreased mobility;Decreased coordination;Decreased cognition;Decreased knowledge of use of DME;Pain ? ?   ?  ?PT Treatment Interventions DME instruction;Gait training;Stair training;Functional mobility training;Therapeutic activities;Therapeutic exercise;Balance training;Neuromuscular re-education;Patient/family education   ? ?PT Goals (Current goals can be found in the Care Plan section)  ?Acute Rehab PT  Goals ?Patient Stated Goal: Play golf with grandson ?PT Goal Formulation: With patient ?Time For Goal Achievement: 02/21/22 ?Potential to Achieve Goals: Good ? ?  ?Frequency 7X/week ?  ? ? ?Co-evaluation   ?  ?  ?  ?  ? ? ?  ?AM-PAC PT "6 Clicks" Mobility  ?Outcome Measure Help needed turning from your back to your side while in a flat bed without using bedrails?: None ?Help needed moving from lying on your back to sitting on the side of a flat bed without using bedrails?: A Little ?Help needed moving to and from a bed to a chair (including a wheelchair)?: A Little ?Help needed standing up from a chair using your arms (e.g., wheelchair or bedside chair)?: A Little ?Help needed to walk in hospital room?: A Little ?Help needed climbing 3-5 steps with a railing? : A Little ?6 Click Score: 19 ? ?  ?End of Session Equipment Utilized During Treatment: Gait belt ?Activity Tolerance: Patient tolerated treatment well ?Patient left: in chair;with call bell/phone within reach ?Nurse Communication: Mobility status ?PT Visit Diagnosis: Difficulty in walking, not elsewhere classified (R26.2) ?  ? ?Time: 5112-0442 ?PT Time Calculation (min) (ACUTE ONLY): 25 min ? ? ?Charges:   PT Evaluation ?$PT Eval Low Complexity: 1 Low ?PT Treatments ?$Therapeutic Exercise: 8-22 mins ?  ?   ? ?Jamesetta Geralds, PT, DPT ?[facility] Department ?Office: [phone] ?Pager: [phone] ? ? ?Jamesetta Geralds ?02/14/2022, 3:00 PM ?

## 2022-02-14 NOTE — Interval H&P Note (Signed)
History and Physical Interval Note: ? ?02/14/2022 ?7:02 AM ? ?Raymond Mitchell  has presented today for surgery, with the diagnosis of OA RIGHT HIP.  The various methods of treatment have been discussed with the patient and family. After consideration of risks, benefits and other options for treatment, the patient has consented to  Procedure(s): ?TOTAL HIP ARTHROPLASTY ANTERIOR APPROACH (Right) as a surgical intervention.  The patient's history has been reviewed, patient examined, no change in status, stable for surgery.  I have reviewed the patient's chart and labs.  Questions were answered to the patient's satisfaction.   ? ? ?Renette Butters ? ? ?

## 2022-02-14 NOTE — Anesthesia Procedure Notes (Signed)
Procedure Name: [place] ?Date/Time: 02/14/2022 7:16 AM ?Performed by: Eben Burow, CRNA ?Pre-anesthesia Checklist: Patient identified, Emergency Drugs available, Suction available, Patient being monitored and Timeout performed ?Oxygen Delivery Method: Simple face mask ?Placement Confirmation: positive ETCO2 ? ? ? ? ?

## 2022-02-14 NOTE — Transfer of Care (Signed)
Immediate Anesthesia Transfer of Care Note ? ?Patient: Raymond Mitchell ? ?Procedure(s) Performed: TOTAL HIP ARTHROPLASTY ANTERIOR APPROACH (Right: Hip) ? ?Patient Location: PACU ? ?Anesthesia Type:Spinal ? ?Level of Consciousness: awake, alert  and patient cooperative ? ?Airway & Oxygen Therapy: Patient Spontanous Breathing and Patient connected to face mask oxygen ? ?Post-op Assessment: Report given to RN ? ?Post vital signs: Reviewed and stable ? ?Last Vitals:  ?Vitals Value Taken Time  ?BP 105/65 02/14/22 0919  ?Temp    ?Pulse 67 02/14/22 0920  ?Resp 14 02/14/22 0920  ?SpO2 98 % 02/14/22 0920  ?Vitals shown include unvalidated device data. ? ?Last Pain:  ?Vitals:  ? 02/14/22 0618  ?TempSrc:   ?PainSc: 0-No pain  ?   ? ?  ? ?Complications: No notable events documented. ?

## 2022-02-16 ENCOUNTER — Ambulatory Visit: Payer: Medicare Other | Attending: Orthopedic Surgery | Admitting: Rehabilitative and Restorative Service Providers"

## 2022-02-16 ENCOUNTER — Encounter: Payer: Self-pay | Admitting: Rehabilitative and Restorative Service Providers"

## 2022-02-16 DIAGNOSIS — M6281 Muscle weakness (generalized): Secondary | ICD-10-CM | POA: Diagnosis not present

## 2022-02-16 DIAGNOSIS — M25551 Pain in right hip: Secondary | ICD-10-CM | POA: Diagnosis not present

## 2022-02-16 DIAGNOSIS — R269 Unspecified abnormalities of gait and mobility: Secondary | ICD-10-CM | POA: Insufficient documentation

## 2022-02-16 DIAGNOSIS — Z96641 Presence of right artificial hip joint: Secondary | ICD-10-CM | POA: Insufficient documentation

## 2022-02-16 DIAGNOSIS — R29898 Other symptoms and signs involving the musculoskeletal system: Secondary | ICD-10-CM | POA: Diagnosis not present

## 2022-02-16 NOTE — Therapy (Signed)
[place], [place], [address] Phone: [phone]   Fax:  [phone]  Physical Therapy Evaluation  Patient Details  Name: Raymond Mitchell MRN: [id number] Date of Birth: Oct 18, 1955 Referring Provider (PT): Dr Edmonia Lynch   Encounter Date: 02/16/2022   PT End of Session - 02/16/22 1236     Visit Number 1    Number of Visits 12    Date for PT Re-Evaluation 03/30/22    Progress Note Due on Visit 10    PT Start Time 3546    PT Stop Time 1230    PT Time Calculation (min) 45 min    Activity Tolerance Patient tolerated treatment well             Past Medical History:  Diagnosis Date   Ankylosing spondylitis ([place])    BPH (benign prostatic hyperplasia)    hx of   Diverticulosis    GERD (gastroesophageal reflux disease)    History of hiatal hernia    Hypertension    Lymphocytic colitis 01/15/2018   Pes planus    Right inguinal hernia    Tobacco user    Varicose veins     Past Surgical History:  Procedure Laterality Date   BACK SURGERY  12/2007   L 4 to L 5 fusion   BACK SURGERY  2012   disc repair   c spine ESI     [name]   INGUINAL HERNIA REPAIR  1994   INGUINAL HERNIA REPAIR Right 07/16/2020   Procedure: LAPAROSCOPIC RIGHT INGUINAL HERNIA WITH MESH;  Surgeon: [name], [name], MD;  Location: [place];  Service: General;  Laterality: Right;   RTC surgery Left 2005    There were no vitals filed for this visit.    Subjective Assessment - 02/16/22 1148     Subjective Patient reports significant increased pain in the Rt hip in the past six months with no known injury. He was no longer able to walk on his treatmill due to pain in the Rt hip. He underwent Rt THA - anterior approach 5/68/12 with no comlications.    Pertinent History Lt RCR 2022; LBP; HTN    Patient Stated Goals return to normal walking and all functional activities    Currently in Pain?  No/denies    Pain Score 0-No pain    Pain Location Hip    Pain Orientation Right    Pain Descriptors / Indicators Burning    Pain Type Surgical pain;Chronic pain    Pain Onset More than a month ago    Pain Frequency Intermittent    Aggravating Factors  moving    Pain Relieving Factors lying still; meds                [facility] PT Assessment - 02/16/22 0001       Assessment   Medical Diagnosis Rt THA anterior approach    Referring Provider (PT) Dr Edmonia Lynch    Onset Date/Surgical Date 02/14/22   increased Rt hip pain since 12/22   Hand Dominance Right    Next MD Visit 03/01/22    Prior Therapy here of Lt RCR      Precautions   Precautions Anterior Hip      Restrictions   Weight Bearing Restrictions No      Balance Screen   Has the patient fallen in the past 6 months No    Has the patient had a decrease in activity  level because of a fear of falling?  No    Is the patient reluctant to leave their home because of a fear of falling?  No      Home Ecologist residence    Living Arrangements Spouse/significant other      Prior Function   Level of Independence Independent    Vocation Full time employment    Occupational psychologist [employer]    Leisure golf; yard work; Buyer, retail; treatment; household; clean; cook      Observation/Other Assessments   Focus on Therapeutic Outcomes (FOTO)  28      Sensation   Additional Comments WFL's      Posture/Postural Control   Posture Comments flexed forward posture with walker      AROM   Overall AROM Comments WFL's Lt hip    Right Hip Extension 0    Right Hip Flexion 90      Strength   Overall Strength Comments functional strength - not tested resistively      Flexibility   Hamstrings tight end range Rt      Transfers   Comments requires assist with Rt LE in and out of supine      Ambulation/Gait   Ambulation/Gait Yes    Ambulation/Gait Assistance 5: Supervision     Ambulation Distance (Feet) 40 Feet    Assistive device Rolling walker    Ambulation Surface Level    Stairs Yes    Stairs Assistance 5: Supervision    Stair Management Technique No rails;Backwards;Forwards    Number of Stairs 1    Height of Stairs 4    Gait Comments adjusted walker height; reviewed gait pattern and safety with walker                        Objective measurements completed on examination: See above findings.       [place] Adult PT Treatment/Exercise - 02/16/22 0001       Knee/Hip Exercises: Stretches   Passive Hamstring Stretch Right;2 reps;30 seconds    Passive Hamstring Stretch Limitations supine with strap      Knee/Hip Exercises: Standing   Hip Flexion AROM;Right;5 reps;Knee bent    Hip Abduction AROM;Right;5 reps;Knee straight    Hip Extension AROM;Right;5 reps;Knee straight    Other Standing Knee Exercises LE swing x 10 each side standing in walker    Other Standing Knee Exercises marching x 10 each side in walker      Knee/Hip Exercises: Supine   Quad Sets AROM;Strengthening;Right;5 reps    Quad Sets Limitations 10 sec hold    Other Supine Knee/Hip Exercises glut set 10 sec x 5 reps    Other Supine Knee/Hip Exercises ankle pumps; circles; alphabet x 5 each LE's elevated                     PT Education - 02/16/22 1233     Education Details POC; HEP    Person(s) Educated Patient    Methods Explanation;Demonstration;Tactile cues;Verbal cues;Handout    Comprehension Verbalized understanding;Returned demonstration;Verbal cues required;Tactile cues required                 PT Long Term Goals - 02/16/22 1245       PT LONG TERM GOAL #1   Title Increase Rt hip ROM to WFL's and pain free in all planes    Time 6    Period Weeks  Status New    Target Date 03/30/22      PT LONG TERM GOAL #2   Title 5/5 strength Rt LE    Time 6    Period Weeks    Status New    Target Date 03/30/22      PT LONG TERM GOAL #3    Title Independent in ambulation for community distances with least restrictive to no assistive device    Time 6    Period Weeks    Status New    Target Date 03/30/22      PT LONG TERM GOAL #4   Title Independent in HEP(including aquatic program as indicated)    Time 6    Period Weeks    Status New    Target Date 03/30/22      PT LONG TERM GOAL #5   Title Improve functional limitation score to 58    Time 6    Period Weeks    Status New    Target Date 03/30/22                    Plan - 02/16/22 1237     Clinical Impression Statement Patient presents s/p Rt THA anterior approach 02/14/22. He has limited Rt LE ROM, strength, mobility; difficulty with transfers; dependent gait; pain alimiting functional activity level and ADL's. Patient will benefit from PT to address problems identified.    Stability/Clinical Decision Making Stable/Uncomplicated    Clinical Decision Making Low    Rehab Potential Good    PT Frequency 2x / week    PT Duration 6 weeks    PT Treatment/Interventions ADLs/Self Care Home Management;Aquatic Therapy;Cryotherapy;Electrical Stimulation;Iontophoresis '4mg'$ /ml Dexamethasone;Moist Heat;Ultrasound;Gait training;Stair training;Functional mobility training;Therapeutic activities;Therapeutic exercise;Balance training;Neuromuscular re-education;Patient/family education;Manual techniques;Passive range of motion;Scar mobilization;Dry needling;Taping    PT Next Visit Plan review and progress exercise program; gait training; ROM; strengthening as tolerated    PT Home Exercise Plan [email or web address]    Consulted and Agree with Plan of Care Patient             Patient will benefit from skilled therapeutic intervention in order to improve the following deficits and impairments:  Abnormal gait, Decreased activity tolerance, Pain, Decreased balance, Impaired flexibility, Decreased mobility, Decreased strength, Increased edema, Postural dysfunction  Visit Diagnosis: Pain  of right hip  Abnormal gait  Other symptoms and signs involving the musculoskeletal system  Muscle weakness (generalized)     Problem List Patient Active Problem List   Diagnosis Date Noted   S/P total right hip arthroplasty 02/14/2022   Primary osteoarthritis of right knee 09/16/2021   Primary osteoarthritis of right hip 09/16/2021   Rotator cuff tear, left 01/18/2021   Snoring 09/27/2020   Venous stasis 06/02/2020   IFG (impaired fasting glucose) 05/28/2019   Hiatal hernia 05/28/2019   BPPV (benign paroxysmal positional vertigo), left 11/26/2018   Allergic rhinitis due to pollen 11/26/2018   Lymphocytic colitis 01/15/2018   Right lumbar radiculopathy 09/12/2016   Hepatic cyst 11/11/2014   Cancer of skin, squamous cell 03/11/2014   Seborrheic dermatitis 09/05/2012   SCC (squamous cell carcinoma), leg 08/29/2011   Left cervical radiculopathy 01/02/2011   POSTURAL LIGHTHEADEDNESS 12/02/2010   CIGARETTE SMOKER 11/29/2010   Fatty liver 10/08/2009   CONSTIPATION, DRUG INDUCED 10/05/2009   DIVERTICULOSIS OF COLON 08/13/2009   [place] DISEASE, LUMBAR 07/08/2007   [place] DISEASE, CERVICAL 03/11/2007   BPH (benign prostatic hyperplasia) 02/13/2007   Hyperlipidemia 01/16/2007   ANKYLOSING SPONDYLITIS 12/05/2006   Essential hypertension,  benign 12/05/2006    Manisha Cancel Nilda Simmer, PT, MPH  02/16/2022, 12:48 PM  [facility] [place], [place], [address] Phone: [phone]   Fax:  [phone]  Name: Raymond Mitchell MRN: [id number] Date of Birth: [birth year]

## 2022-02-16 NOTE — Patient Instructions (Signed)
Access Code: [email or web address] URL: [email or web address] Date: 02/16/2022 Prepared by: Gillermo Murdoch  Exercises - Hooklying Hamstring Stretch with Strap  - 2 x daily - 7 x weekly - 1 sets - 3 reps - 30 sec  hold - Supine Quad Set  - 2 x daily - 7 x weekly - 1 sets - 10 reps - 3 sec  hold - Supine Gluteal Sets  - 2 x daily - 7 x weekly - 1 sets - 3 reps - 30 sec  hold - Ankle Pumps in Elevation  - 2 x daily - 7 x weekly - 1 sets - 10-20 reps - Ankle Circles in Elevation  - 2 x daily - 7 x weekly - 1 sets - 10-15 reps - Ankle Alphabet in Elevation  - 2 x daily - 7 x weekly - 1 sets - 3 reps - Standing Marching  - 2 x daily - 7 x weekly - 1 sets - 3 reps - 30 sec  hold - Leg Swing Single Leg Balance  - 2 x daily - 7 x weekly - 1 sets - 3 reps - 30 sec  hold - Standing Hip Abduction with Counter Support  - 2 x daily - 7 x weekly - 2-3 sets - 10 reps - 2-3 sec  hold

## 2022-02-21 ENCOUNTER — Ambulatory Visit: Payer: Medicare Other | Admitting: Physical Therapy

## 2022-02-21 ENCOUNTER — Encounter (HOSPITAL_COMMUNITY): Payer: Self-pay | Admitting: Orthopedic Surgery

## 2022-02-21 DIAGNOSIS — R29898 Other symptoms and signs involving the musculoskeletal system: Secondary | ICD-10-CM

## 2022-02-21 DIAGNOSIS — M25551 Pain in right hip: Secondary | ICD-10-CM

## 2022-02-21 DIAGNOSIS — M6281 Muscle weakness (generalized): Secondary | ICD-10-CM

## 2022-02-21 DIAGNOSIS — Z96641 Presence of right artificial hip joint: Secondary | ICD-10-CM | POA: Diagnosis not present

## 2022-02-21 DIAGNOSIS — R269 Unspecified abnormalities of gait and mobility: Secondary | ICD-10-CM

## 2022-02-21 NOTE — Therapy (Signed)
[place], [place], [address] Phone: [phone]   Fax:  [phone]  Physical Therapy Treatment  Patient Details  Name: Raymond Mitchell MRN: [id number] Date of Birth: 06-13-1956 Referring Provider (PT): Dr Edmonia Lynch   Encounter Date: 02/21/2022   PT End of Session - 02/21/22 1622     Visit Number 2    Number of Visits 12    Date for PT Re-Evaluation 03/30/22    Authorization - Visit Number 2    Progress Note Due on Visit 10    PT Start Time 1622    PT Stop Time 1700    PT Time Calculation (min) 38 min    Activity Tolerance Patient tolerated treatment well    Behavior During Therapy [facility] for tasks assessed/performed             Past Medical History:  Diagnosis Date   Ankylosing spondylitis ([place])    BPH (benign prostatic hyperplasia)    hx of   Diverticulosis    GERD (gastroesophageal reflux disease)    History of hiatal hernia    Hypertension    Lymphocytic colitis 01/15/2018   Pes planus    Right inguinal hernia    Tobacco user    Varicose veins     Past Surgical History:  Procedure Laterality Date   BACK SURGERY  12/2007   L 4 to L 5 fusion   BACK SURGERY  2012   disc repair   c spine ESI     [name]   INGUINAL HERNIA REPAIR  1994   INGUINAL HERNIA REPAIR Right 07/16/2020   Procedure: LAPAROSCOPIC RIGHT INGUINAL HERNIA WITH MESH;  Surgeon: [name], [name], MD;  Location: [facility];  Service: General;  Laterality: Right;   RTC surgery Left 2005    There were no vitals filed for this visit.   Subjective Assessment - 02/21/22 1625     Subjective Pt reports pain only happens with certain movements. States he has not been needing to use the RW at home.    Pertinent History Lt RCR 2022; LBP; HTN    Patient Stated Goals return to normal walking and all functional activities    Currently in Pain? Yes    Pain Score 5     Pain Location Hip     Pain Orientation Right    Pain Onset More than a month ago                [facility] PT Assessment - 02/21/22 0001       Assessment   Medical Diagnosis Rt THA anterior approach    Referring Provider (PT) Dr Edmonia Lynch    Onset Date/Surgical Date 02/14/22    Hand Dominance Right    Next MD Visit 03/01/22      Precautions   Precautions Anterior Hip                           [facility] Adult PT Treatment/Exercise - 02/21/22 0001       Ambulation/Gait   Ambulation/Gait Assistance 5: Supervision    Ambulation Distance (Feet) 160 Feet    Assistive device None    Gait Pattern Step-through pattern;Trendelenburg;Antalgic;Lateral trunk lean to right;Lateral hip instability    Ambulation Surface Level    Gait Comments forwards and backwards walking without a/d      Knee/Hip Exercises: Stretches   Passive Hamstring Stretch Right;2 reps;30  seconds    Passive Hamstring Stretch Limitations supine with strap      Knee/Hip Exercises: Aerobic   Nustep L5 x 5 min LEs/UEs      Knee/Hip Exercises: Standing   Hip Flexion AROM;Right;5 reps;Knee bent    Hip Abduction Stengthening;Right;Left;2 sets;10 reps;Knee straight    Hip Extension Stengthening;Right;Left;2 sets;10 reps;Knee straight    Other Standing Knee Exercises mini squat 2x10    Other Standing Knee Exercises marching x 10 each side next to counter      Knee/Hip Exercises: Seated   Long Arc Quad Strengthening;Right;2 sets;10 reps    Long Arc Quad Limitations red tband    Hamstring Curl Strengthening;Right;2 sets;10 reps    Hamstring Limitations red tband      Knee/Hip Exercises: Supine   Quad Sets AROM;Strengthening;Right;10 reps    [employer] Limitations 3 sec hold    Short Arc Quad Sets Strengthening;Right;10 reps    Bridges Strengthening;Both;2 sets;10 reps    Other Supine Knee/Hip Exercises hip flexion 2x10                          PT Long Term Goals - 02/16/22 1245       PT LONG TERM  GOAL #1   Title Increase Rt hip ROM to WFL's and pain free in all planes    Time 6    Period Weeks    Status New    Target Date 03/30/22      PT LONG TERM GOAL #2   Title 5/5 strength Rt LE    Time 6    Period Weeks    Status New    Target Date 03/30/22      PT LONG TERM GOAL #3   Title Independent in ambulation for community distances with least restrictive to no assistive device    Time 6    Period Weeks    Status New    Target Date 03/30/22      PT LONG TERM GOAL #4   Title Independent in HEP(including aquatic program as indicated)    Time 6    Period Weeks    Status New    Target Date 03/30/22      PT LONG TERM GOAL #5   Title Improve functional limitation score to 58    Time 6    Period Weeks    Status New    Target Date 03/30/22                   Plan - 02/21/22 1653     Clinical Impression Statement Treatment focused on continued strengthening and progressing HEP as able. Pt with improved ability to lift R LE on to mat table. He is still not strong enough to perform a SLR. States he has not been walking with his RW at home.    Stability/Clinical Decision Making Stable/Uncomplicated    Rehab Potential Good    PT Frequency 2x / week    PT Duration 6 weeks    PT Treatment/Interventions ADLs/Self Care Home Management;Aquatic Therapy;Cryotherapy;Electrical Stimulation;Iontophoresis '4mg'$ /ml Dexamethasone;Moist Heat;Ultrasound;Gait training;Stair training;Functional mobility training;Therapeutic activities;Therapeutic exercise;Balance training;Neuromuscular re-education;Patient/family education;Manual techniques;Passive range of motion;Scar mobilization;Dry needling;Taping    PT Next Visit Plan review and progress exercise program; gait training; ROM; strengthening as tolerated    PT Home Exercise Plan [id number]    Consulted and Agree with Plan of Care Patient             Patient will  benefit from skilled therapeutic intervention in order to improve the  following deficits and impairments:  Abnormal gait, Decreased activity tolerance, Pain, Decreased balance, Impaired flexibility, Decreased mobility, Decreased strength, Increased edema, Postural dysfunction  Visit Diagnosis: Pain of right hip  Abnormal gait  Other symptoms and signs involving the musculoskeletal system  Muscle weakness (generalized)     Problem List Patient Active Problem List   Diagnosis Date Noted   S/P total right hip arthroplasty 02/14/2022   Primary osteoarthritis of right knee 09/16/2021   Primary osteoarthritis of right hip 09/16/2021   Rotator cuff tear, left 01/18/2021   Snoring 09/27/2020   Venous stasis 06/02/2020   IFG (impaired fasting glucose) 05/28/2019   Hiatal hernia 05/28/2019   BPPV (benign paroxysmal positional vertigo), left 11/26/2018   Allergic rhinitis due to pollen 11/26/2018   Lymphocytic colitis 01/15/2018   Right lumbar radiculopathy 09/12/2016   Hepatic cyst 11/11/2014   Cancer of skin, squamous cell 03/11/2014   Seborrheic dermatitis 09/05/2012   SCC (squamous cell carcinoma), leg 08/29/2011   Left cervical radiculopathy 01/02/2011   POSTURAL LIGHTHEADEDNESS 12/02/2010   CIGARETTE SMOKER 11/29/2010   Fatty liver 10/08/2009   CONSTIPATION, DRUG INDUCED 10/05/2009   DIVERTICULOSIS OF COLON 08/13/2009   [place] DISEASE, LUMBAR 07/08/2007   [place] DISEASE, CERVICAL 03/11/2007   BPH (benign prostatic hyperplasia) 02/13/2007   Hyperlipidemia 01/16/2007   ANKYLOSING SPONDYLITIS 12/05/2006   Essential hypertension, benign 12/05/2006    [facility] April Gordy Levan, PT, DPT 02/21/2022, 5:05 PM  [facility] [place] [address] [place], [place], [address] Phone: [phone]   Fax:  [phone]  Name: Raymond Mitchell MRN: [id number] Date of Birth: 31-May-1956

## 2022-02-23 ENCOUNTER — Ambulatory Visit: Payer: Medicare Other | Admitting: Rehabilitative and Restorative Service Providers"

## 2022-02-23 ENCOUNTER — Encounter: Payer: Self-pay | Admitting: Rehabilitative and Restorative Service Providers"

## 2022-02-23 DIAGNOSIS — Z96641 Presence of right artificial hip joint: Secondary | ICD-10-CM | POA: Diagnosis not present

## 2022-02-23 DIAGNOSIS — R269 Unspecified abnormalities of gait and mobility: Secondary | ICD-10-CM

## 2022-02-23 DIAGNOSIS — M25551 Pain in right hip: Secondary | ICD-10-CM

## 2022-02-23 DIAGNOSIS — M6281 Muscle weakness (generalized): Secondary | ICD-10-CM

## 2022-02-23 DIAGNOSIS — R29898 Other symptoms and signs involving the musculoskeletal system: Secondary | ICD-10-CM

## 2022-02-23 NOTE — Therapy (Signed)
[place], [place], [address] Phone: [phone]   Fax:  [phone]  Physical Therapy Treatment  Rationale for Evaluation and Treatment Rehabilitation  Patient Details  Name: Raymond Mitchell MRN: [id number] Date of Birth: 05-Jan-1956 Referring Provider (PT): Dr Edmonia Lynch   Encounter Date: 02/23/2022   PT End of Session - 02/23/22 1453     Visit Number 3    Number of Visits 12    Date for PT Re-Evaluation 03/30/22    Authorization - Visit Number 3    Progress Note Due on Visit 10    PT Start Time 1440    PT Stop Time 1525    PT Time Calculation (min) 45 min    Activity Tolerance Patient tolerated treatment well             Past Medical History:  Diagnosis Date   Ankylosing spondylitis ([place])    BPH (benign prostatic hyperplasia)    hx of   Diverticulosis    GERD (gastroesophageal reflux disease)    History of hiatal hernia    Hypertension    Lymphocytic colitis 01/15/2018   Pes planus    Right inguinal hernia    Tobacco user    Varicose veins     Past Surgical History:  Procedure Laterality Date   BACK SURGERY  12/2007   L 4 to L 5 fusion   BACK SURGERY  2012   disc repair   c spine ESI     [name]   INGUINAL HERNIA REPAIR  1994   INGUINAL HERNIA REPAIR Right 07/16/2020   Procedure: LAPAROSCOPIC RIGHT INGUINAL HERNIA WITH MESH;  Surgeon: [name], [name], MD;  Location: [facility];  Service: General;  Laterality: Right;   RTC surgery Left 2005   TOTAL HIP ARTHROPLASTY Right 02/14/2022   Procedure: TOTAL HIP ARTHROPLASTY ANTERIOR APPROACH;  Surgeon: Renette Butters, MD;  [facility];  Service: Orthopedics;  Laterality: Right;    There were no vitals filed for this visit.   Subjective Assessment - 02/23/22 1455     Subjective Continued improvement with decreasing pain and improving mobility. Walking without assistive device at home.     Currently in Pain? Yes    Pain Score 3     Pain Location Hip    Pain Orientation Right    Pain Descriptors / Indicators Burning                               [facility] Adult PT Treatment/Exercise - 02/23/22 0001       Ambulation/Gait   Pre-Gait Activities side steps along counter x 5 times both directions    Gait Comments forwards and backwards walking without a/d      Knee/Hip Exercises: Stretches   Passive Hamstring Stretch Right;2 reps;30 seconds    Passive Hamstring Stretch Limitations supine with strap    Hip Flexor Stretch Right;2 reps;30 seconds    Hip Flexor Stretch Limitations supine foot off edge of table (modified Thomas)      Knee/Hip Exercises: Aerobic   Nustep L5 x 5 min LEs/UEs      Knee/Hip Exercises: Standing   Hip Flexion AROM;Right;Left;20 reps;Knee bent    Hip Flexion Limitations tapping 12 inch step alternating LE's    Hip Abduction Stengthening;Right;Left;2 sets;10 reps;Knee straight    Hip Extension Stengthening;Right;Left;2 sets;10 reps;Knee straight    Functional Squat 20  reps    Other Standing Knee Exercises marching x 10 each side next to counter      Knee/Hip Exercises: Supine   Quad Sets AROM;Strengthening;Right;10 reps    Quad Sets Limitations 5 sec hold    Short Arc Quad Sets Strengthening;Right;10 reps    Bridges Strengthening;Both;2 sets;10 reps    Straight Leg Raises AROM;AAROM;Right;10 reps    Straight Leg Raises Limitations PT assist for concentric lift; pt holding and lowering eccentricaly    Other Supine Knee/Hip Exercises hip flexion 2x10                          PT Long Term Goals - 02/16/22 1245       PT LONG TERM GOAL #1   Title Increase Rt hip ROM to WFL's and pain free in all planes    Time 6    Period Weeks    Status New    Target Date 03/30/22      PT LONG TERM GOAL #2   Title 5/5 strength Rt LE    Time 6    Period Weeks    Status New    Target Date 03/30/22      PT LONG TERM GOAL  #3   Title Independent in ambulation for community distances with least restrictive to no assistive device    Time 6    Period Weeks    Status New    Target Date 03/30/22      PT LONG TERM GOAL #4   Title Independent in HEP(including aquatic program as indicated)    Time 6    Period Weeks    Status New    Target Date 03/30/22      PT LONG TERM GOAL #5   Title Improve functional limitation score to 58    Time 6    Period Weeks    Status New    Target Date 03/30/22                   Plan - 02/23/22 1456     Clinical Impression Statement Continued progress with Rt THA progressing exercises as tolerated. AASLR with eccentric lowering; added gentle hip ext/quad stretch on edge of table(modified Marcello Moores). Continued with gait training working on wt shift and trunk control. Progressing well.    Rehab Potential Good    PT Frequency 2x / week    PT Duration 6 weeks    PT Treatment/Interventions ADLs/Self Care Home Management;Aquatic Therapy;Cryotherapy;Electrical Stimulation;Iontophoresis '4mg'$ /ml Dexamethasone;Moist Heat;Ultrasound;Gait training;Stair training;Functional mobility training;Therapeutic activities;Therapeutic exercise;Balance training;Neuromuscular re-education;Patient/family education;Manual techniques;Passive range of motion;Scar mobilization;Dry needling;Taping    PT Next Visit Plan review and progress exercise program; gait training; ROM; strengthening as tolerated    PT Home Exercise Plan [id number]    Consulted and Agree with Plan of Care Patient             Patient will benefit from skilled therapeutic intervention in order to improve the following deficits and impairments:     Visit Diagnosis: Pain of right hip  Abnormal gait  Other symptoms and signs involving the musculoskeletal system  Muscle weakness (generalized)     Problem List Patient Active Problem List   Diagnosis Date Noted   S/P total right hip arthroplasty 02/14/2022   Primary  osteoarthritis of right knee 09/16/2021   Primary osteoarthritis of right hip 09/16/2021   Rotator cuff tear, left 01/18/2021   Snoring 09/27/2020   Venous stasis 06/02/2020  IFG (impaired fasting glucose) 05/28/2019   Hiatal hernia 05/28/2019   BPPV (benign paroxysmal positional vertigo), left 11/26/2018   Allergic rhinitis due to pollen 11/26/2018   Lymphocytic colitis 01/15/2018   Right lumbar radiculopathy 09/12/2016   Hepatic cyst 11/11/2014   Cancer of skin, squamous cell 03/11/2014   Seborrheic dermatitis 09/05/2012   SCC (squamous cell carcinoma), leg 08/29/2011   Left cervical radiculopathy 01/02/2011   POSTURAL LIGHTHEADEDNESS 12/02/2010   CIGARETTE SMOKER 11/29/2010   Fatty liver 10/08/2009   CONSTIPATION, DRUG INDUCED 10/05/2009   DIVERTICULOSIS OF COLON 08/13/2009   [place] DISEASE, LUMBAR 07/08/2007   [place] DISEASE, CERVICAL 03/11/2007   BPH (benign prostatic hyperplasia) 02/13/2007   Hyperlipidemia 01/16/2007   ANKYLOSING SPONDYLITIS 12/05/2006   Essential hypertension, benign 12/05/2006    Delaynie Stetzer Nilda Simmer, PT, MPH  02/23/2022, 3:33 PM  [facility] [place] [address] [place], [place], [address] Phone: [phone]   Fax:  [phone]  Name: Raymond Mitchell MRN: [id number] Date of Birth: 09-23-1956

## 2022-03-01 ENCOUNTER — Ambulatory Visit: Payer: Medicare Other | Admitting: Physical Therapy

## 2022-03-01 DIAGNOSIS — R29898 Other symptoms and signs involving the musculoskeletal system: Secondary | ICD-10-CM

## 2022-03-01 DIAGNOSIS — M25551 Pain in right hip: Secondary | ICD-10-CM

## 2022-03-01 DIAGNOSIS — M6281 Muscle weakness (generalized): Secondary | ICD-10-CM

## 2022-03-01 DIAGNOSIS — Z96641 Presence of right artificial hip joint: Secondary | ICD-10-CM | POA: Diagnosis not present

## 2022-03-01 DIAGNOSIS — R269 Unspecified abnormalities of gait and mobility: Secondary | ICD-10-CM

## 2022-03-01 NOTE — Therapy (Signed)
[place], [place], [address] Phone: [phone]   Fax:  [phone]  Physical Therapy Treatment  Patient Details  Name: Raymond Mitchell MRN: [id number] Date of Birth: 1955/12/19 Referring Provider (PT): Dr Edmonia Lynch   Encounter Date: 03/01/2022   PT End of Session - 03/01/22 0805     Visit Number 4    Number of Visits 12    Date for PT Re-Evaluation 03/30/22    Authorization Type Medicare    Authorization - Visit Number 4    Progress Note Due on Visit 10    PT Start Time 0805    PT Stop Time 0845    PT Time Calculation (min) 40 min    Activity Tolerance Patient tolerated treatment well    Behavior During Therapy [facility] for tasks assessed/performed             Past Medical History:  Diagnosis Date   Ankylosing spondylitis ([place])    BPH (benign prostatic hyperplasia)    hx of   Diverticulosis    GERD (gastroesophageal reflux disease)    History of hiatal hernia    Hypertension    Lymphocytic colitis 01/15/2018   Pes planus    Right inguinal hernia    Tobacco user    Varicose veins     Past Surgical History:  Procedure Laterality Date   BACK SURGERY  12/2007   L 4 to L 5 fusion   BACK SURGERY  2012   disc repair   c spine ESI     [name]   INGUINAL HERNIA REPAIR  1994   INGUINAL HERNIA REPAIR Right 07/16/2020   Procedure: LAPAROSCOPIC RIGHT INGUINAL HERNIA WITH MESH;  Surgeon: [name], [name], MD;  Location: [facility];  Service: General;  Laterality: Right;   RTC surgery Left 2005   TOTAL HIP ARTHROPLASTY Right 02/14/2022   Procedure: TOTAL HIP ARTHROPLASTY ANTERIOR APPROACH;  Surgeon: Renette Butters, MD;  [facility];  Service: Orthopedics;  Laterality: Right;    There were no vitals filed for this visit.   Subjective Assessment - 03/01/22 0806     Subjective Continues to do well at home. Has been doing his exercises.    Patient  Stated Goals return to normal walking and all functional activities    Currently in Pain? No/denies    Pain Location Hip    Pain Orientation Right    Pain Descriptors / Indicators Burning    Pain Type Surgical pain                [facility] PT Assessment - 03/01/22 0001       Assessment   Medical Diagnosis Rt THA anterior approach    Referring Provider (PT) Dr Edmonia Lynch    Onset Date/Surgical Date 02/14/22    Hand Dominance Right    Next MD Visit 03/01/22                           [facility] Adult PT Treatment/Exercise - 03/01/22 0001       Ambulation/Gait   Pre-Gait Activities side steps 3x15' with 5# KB    Gait Comments forwards and backwards walking with 5# KB      Knee/Hip Exercises: Stretches   Hip Flexor Stretch Right;30 seconds    Hip Flexor Stretch Limitations supine foot off edge of table (modified Thomas)    Knee: Self-Stretch to increase Flexion  30 seconds      Knee/Hip Exercises: Aerobic   Nustep L5 x 5 min LEs only      Knee/Hip Exercises: Standing   Hip Abduction Stengthening;Right;Left;2 sets;10 reps;Knee straight    Abduction Limitations red tband    Hip Extension Stengthening;Right;Left;2 sets;10 reps;Knee straight    Extension Limitations red tband    Functional Squat 20 reps    SLS 2x30 sec    Other Standing Knee Exercises palloff press red tband    Other Standing Knee Exercises marching 2x10 with 5# KB      Knee/Hip Exercises: Supine   Bridges Strengthening;Both;2 sets;10 reps    [employer] Limitations with marching    Straight Leg Raises Strengthening;Right;2 sets;10 reps    Other Supine Knee/Hip Exercises hip flexion 2x10 red tband                          PT Long Term Goals - 02/16/22 1245       PT LONG TERM GOAL #1   Title Increase Rt hip ROM to WFL's and pain free in all planes    Time 6    Period Weeks    Status New    Target Date 03/30/22      PT LONG TERM GOAL #2   Title 5/5 strength Rt LE     Time 6    Period Weeks    Status New    Target Date 03/30/22      PT LONG TERM GOAL #3   Title Independent in ambulation for community distances with least restrictive to no assistive device    Time 6    Period Weeks    Status New    Target Date 03/30/22      PT LONG TERM GOAL #4   Title Independent in HEP(including aquatic program as indicated)    Time 6    Period Weeks    Status New    Target Date 03/30/22      PT LONG TERM GOAL #5   Title Improve functional limitation score to 58    Time 6    Period Weeks    Status New    Target Date 03/30/22                   Plan - 03/01/22 0823     Clinical Impression Statement Worked on progressing strengthening. Tolerated red tband well. Improved SLR.    Rehab Potential Good    PT Frequency 2x / week    PT Duration 6 weeks    PT Treatment/Interventions ADLs/Self Care Home Management;Aquatic Therapy;Cryotherapy;Electrical Stimulation;Iontophoresis '4mg'$ /ml Dexamethasone;Moist Heat;Ultrasound;Gait training;Stair training;Functional mobility training;Therapeutic activities;Therapeutic exercise;Balance training;Neuromuscular re-education;Patient/family education;Manual techniques;Passive range of motion;Scar mobilization;Dry needling;Taping    PT Next Visit Plan review and progress exercise program; gait training; ROM; strengthening as tolerated    PT Home Exercise Plan [id number]    Consulted and Agree with Plan of Care Patient             Patient will benefit from skilled therapeutic intervention in order to improve the following deficits and impairments:  Abnormal gait, Decreased activity tolerance, Pain, Decreased balance, Impaired flexibility, Decreased mobility, Decreased strength, Increased edema, Postural dysfunction  Visit Diagnosis: Pain of right hip  Abnormal gait  Other symptoms and signs involving the musculoskeletal system  Muscle weakness (generalized)     Problem List Patient Active Problem List    Diagnosis Date Noted   S/P total right hip  arthroplasty 02/14/2022   Primary osteoarthritis of right knee 09/16/2021   Primary osteoarthritis of right hip 09/16/2021   Rotator cuff tear, left 01/18/2021   Snoring 09/27/2020   Venous stasis 06/02/2020   IFG (impaired fasting glucose) 05/28/2019   Hiatal hernia 05/28/2019   BPPV (benign paroxysmal positional vertigo), left 11/26/2018   Allergic rhinitis due to pollen 11/26/2018   Lymphocytic colitis 01/15/2018   Right lumbar radiculopathy 09/12/2016   Hepatic cyst 11/11/2014   Cancer of skin, squamous cell 03/11/2014   Seborrheic dermatitis 09/05/2012   SCC (squamous cell carcinoma), leg 08/29/2011   Left cervical radiculopathy 01/02/2011   POSTURAL LIGHTHEADEDNESS 12/02/2010   CIGARETTE SMOKER 11/29/2010   Fatty liver 10/08/2009   CONSTIPATION, DRUG INDUCED 10/05/2009   DIVERTICULOSIS OF COLON 08/13/2009   [place] DISEASE, LUMBAR 07/08/2007   [place] DISEASE, CERVICAL 03/11/2007   BPH (benign prostatic hyperplasia) 02/13/2007   Hyperlipidemia 01/16/2007   ANKYLOSING SPONDYLITIS 12/05/2006   Essential hypertension, benign 12/05/2006    [facility] [address] PT, DPT 03/01/2022, 8:41 AM  [facility] [place] [address] [place], [place], [address] Phone: [phone]   Fax:  [phone]  Name: Raymond Mitchell MRN: [id number] Date of Birth: [birth year]

## 2022-03-06 ENCOUNTER — Ambulatory Visit: Payer: Medicare Other | Attending: Orthopedic Surgery | Admitting: Physical Therapy

## 2022-03-06 DIAGNOSIS — R29898 Other symptoms and signs involving the musculoskeletal system: Secondary | ICD-10-CM | POA: Insufficient documentation

## 2022-03-06 DIAGNOSIS — R293 Abnormal posture: Secondary | ICD-10-CM | POA: Diagnosis present

## 2022-03-06 DIAGNOSIS — R269 Unspecified abnormalities of gait and mobility: Secondary | ICD-10-CM | POA: Diagnosis present

## 2022-03-06 DIAGNOSIS — Z9889 Other specified postprocedural states: Secondary | ICD-10-CM | POA: Diagnosis present

## 2022-03-06 DIAGNOSIS — M25551 Pain in right hip: Secondary | ICD-10-CM | POA: Diagnosis present

## 2022-03-06 DIAGNOSIS — M6281 Muscle weakness (generalized): Secondary | ICD-10-CM | POA: Diagnosis present

## 2022-03-06 NOTE — Therapy (Signed)
[place], [place], [address] Phone: [phone]   Fax:  [phone]  Physical Therapy Treatment  Patient Details  Name: Raymond Mitchell MRN: [id number] Date of Birth: 03-23-1956 Referring Provider (PT): Dr Edmonia Lynch   Encounter Date: 03/06/2022   PT End of Session - 03/06/22 1024     Visit Number 5    Number of Visits 12    Date for PT Re-Evaluation 03/30/22    Authorization Type Medicare    Authorization - Visit Number 5    Progress Note Due on Visit 10    PT Start Time 1015    PT Stop Time 1100    PT Time Calculation (min) 45 min    Activity Tolerance Patient tolerated treatment well    Behavior During Therapy [facility] for tasks assessed/performed             Past Medical History:  Diagnosis Date   Ankylosing spondylitis ([place])    BPH (benign prostatic hyperplasia)    hx of   Diverticulosis    GERD (gastroesophageal reflux disease)    History of hiatal hernia    Hypertension    Lymphocytic colitis 01/15/2018   Pes planus    Right inguinal hernia    Tobacco user    Varicose veins     Past Surgical History:  Procedure Laterality Date   BACK SURGERY  12/2007   L 4 to L 5 fusion   BACK SURGERY  2012   disc repair   c spine ESI     [name]   INGUINAL HERNIA REPAIR  1994   INGUINAL HERNIA REPAIR Right 07/16/2020   Procedure: LAPAROSCOPIC RIGHT INGUINAL HERNIA WITH MESH;  Surgeon: [name], [name], MD;  Location: [place];  Service: General;  Laterality: Right;   RTC surgery Left 2005   TOTAL HIP ARTHROPLASTY Right 02/14/2022   Procedure: TOTAL HIP ARTHROPLASTY ANTERIOR APPROACH;  Surgeon: Renette Butters, MD;  [facility];  Service: Orthopedics;  Laterality: Right;    There were no vitals filed for this visit.   Subjective Assessment - 03/06/22 1024     Subjective Pt reports he walked ~8000 steps on Saturday. Has been cleared to walk as  tolerated. Has been out of town -- not able to do all of his exercises during the weekend.    Pertinent History Lt RCR 2022; LBP; HTN    Patient Stated Goals return to normal walking and all functional activities    Currently in Pain? No/denies                [facility] PT Assessment - 03/06/22 0001       Assessment   Medical Diagnosis Rt THA anterior approach    Referring Provider (PT) Dr Edmonia Lynch    Onset Date/Surgical Date 02/14/22    Hand Dominance Right                           [facility] Adult PT Treatment/Exercise - 03/06/22 0001       Knee/Hip Exercises: Aerobic   Nustep L5 x 5 min LEs only      Knee/Hip Exercises: Machines for Strengthening   Total Gym Leg Press 6 plates DL 7T24; 4 plates SL on R 5Y09      Knee/Hip Exercises: Standing   Knee Flexion Strengthening;2 sets;Right;10 reps    Knee Flexion Limitations red tband  Forward Step Up Right;2 sets;10 reps;Step Height: 4";Hand Hold: 1    Forward Step Up Limitations Runner's step up    SLS 2x30 sec    Other Standing Knee Exercises Red tband side stepping 3x10; forward/backward monster walk 2x20'      Knee/Hip Exercises: Seated   Sit to Sand 10 reps      Knee/Hip Exercises: Supine   Straight Leg Raises Strengthening;Right;10 reps;3 sets    Straight Leg Raises Limitations 2#    Other Supine Knee/Hip Exercises hip flexion 3x10 red tband                          PT Long Term Goals - 02/16/22 1245       PT LONG TERM GOAL #1   Title Increase Rt hip ROM to WFL's and pain free in all planes    Time 6    Period Weeks    Status New    Target Date 03/30/22      PT LONG TERM GOAL #2   Title 5/5 strength Rt LE    Time 6    Period Weeks    Status New    Target Date 03/30/22      PT LONG TERM GOAL #3   Title Independent in ambulation for community distances with least restrictive to no assistive device    Time 6    Period Weeks    Status New    Target Date 03/30/22       PT LONG TERM GOAL #4   Title Independent in HEP(including aquatic program as indicated)    Time 6    Period Weeks    Status New    Target Date 03/30/22      PT LONG TERM GOAL #5   Title Improve functional limitation score to 58    Time 6    Period Weeks    Status New    Target Date 03/30/22                   Plan - 03/06/22 1035     Clinical Impression Statement Continued to work on progressing strengthening. Has increased walking.    Rehab Potential Good    PT Frequency 2x / week    PT Duration 6 weeks    PT Treatment/Interventions ADLs/Self Care Home Management;Aquatic Therapy;Cryotherapy;Electrical Stimulation;Iontophoresis '4mg'$ /ml Dexamethasone;Moist Heat;Ultrasound;Gait training;Stair training;Functional mobility training;Therapeutic activities;Therapeutic exercise;Balance training;Neuromuscular re-education;Patient/family education;Manual techniques;Passive range of motion;Scar mobilization;Dry needling;Taping    PT Next Visit Plan review and progress exercise program; gait training; ROM; strengthening as tolerated    PT Home Exercise Plan [id number]    Consulted and Agree with Plan of Care Patient             Patient will benefit from skilled therapeutic intervention in order to improve the following deficits and impairments:  Abnormal gait, Decreased activity tolerance, Pain, Decreased balance, Impaired flexibility, Decreased mobility, Decreased strength, Increased edema, Postural dysfunction  Visit Diagnosis: Pain of right hip  Abnormal gait  Other symptoms and signs involving the musculoskeletal system  Muscle weakness (generalized)     Problem List Patient Active Problem List   Diagnosis Date Noted   S/P total right hip arthroplasty 02/14/2022   Primary osteoarthritis of right knee 09/16/2021   Primary osteoarthritis of right hip 09/16/2021   Rotator cuff tear, left 01/18/2021   Snoring 09/27/2020   Venous stasis 06/02/2020   IFG (impaired  fasting glucose) 05/28/2019   Hiatal  hernia 05/28/2019   BPPV (benign paroxysmal positional vertigo), left 11/26/2018   Allergic rhinitis due to pollen 11/26/2018   Lymphocytic colitis 01/15/2018   Right lumbar radiculopathy 09/12/2016   Hepatic cyst 11/11/2014   Cancer of skin, squamous cell 03/11/2014   Seborrheic dermatitis 09/05/2012   SCC (squamous cell carcinoma), leg 08/29/2011   Left cervical radiculopathy 01/02/2011   POSTURAL LIGHTHEADEDNESS 12/02/2010   CIGARETTE SMOKER 11/29/2010   Fatty liver 10/08/2009   CONSTIPATION, DRUG INDUCED 10/05/2009   DIVERTICULOSIS OF COLON 08/13/2009   [place] DISEASE, LUMBAR 07/08/2007   DISC DISEASE, CERVICAL 03/11/2007   BPH (benign prostatic hyperplasia) 02/13/2007   Hyperlipidemia 01/16/2007   ANKYLOSING SPONDYLITIS 12/05/2006   Essential hypertension, benign 12/05/2006    [facility] April Gordy Levan, PT, DPT 03/06/2022, 10:56 AM  [facility] [place] [address] [place], [place], [address] Phone: [phone]   Fax:  [phone]  Name: Raymond Mitchell MRN: [id number] Date of Birth: Aug 16, 1956

## 2022-03-09 ENCOUNTER — Ambulatory Visit: Payer: Medicare Other | Admitting: Physical Therapy

## 2022-03-09 DIAGNOSIS — M6281 Muscle weakness (generalized): Secondary | ICD-10-CM

## 2022-03-09 DIAGNOSIS — M25551 Pain in right hip: Secondary | ICD-10-CM

## 2022-03-09 DIAGNOSIS — R269 Unspecified abnormalities of gait and mobility: Secondary | ICD-10-CM

## 2022-03-09 DIAGNOSIS — R29898 Other symptoms and signs involving the musculoskeletal system: Secondary | ICD-10-CM

## 2022-03-09 NOTE — Therapy (Signed)
[place], [place], [address] Phone: [phone]   Fax:  [phone]  Physical Therapy Treatment  Patient Details  Name: Raymond Mitchell MRN: [id number] Date of Birth: October 22, 1955 Referring Provider (PT): Dr Edmonia Lynch   Encounter Date: 03/09/2022   PT End of Session - 03/09/22 1104     Visit Number 6    Number of Visits 12    Date for PT Re-Evaluation 03/30/22    Authorization Type Medicare    Authorization - Visit Number 6    Progress Note Due on Visit 10    PT Start Time 1104    PT Stop Time 8527    PT Time Calculation (min) 41 min    Activity Tolerance Patient tolerated treatment well    Behavior During Therapy [facility] for tasks assessed/performed             Past Medical History:  Diagnosis Date   Ankylosing spondylitis ([place])    BPH (benign prostatic hyperplasia)    hx of   Diverticulosis    GERD (gastroesophageal reflux disease)    History of hiatal hernia    Hypertension    Lymphocytic colitis 01/15/2018   Pes planus    Right inguinal hernia    Tobacco user    Varicose veins     Past Surgical History:  Procedure Laterality Date   BACK SURGERY  12/2007   L 4 to L 5 fusion   BACK SURGERY  2012   disc repair   c spine ESI     [name]   INGUINAL HERNIA REPAIR  1994   INGUINAL HERNIA REPAIR Right 07/16/2020   Procedure: LAPAROSCOPIC RIGHT INGUINAL HERNIA WITH MESH;  Surgeon: [name], [name], MD;  Location: [place];  Service: General;  Laterality: Right;   RTC surgery Left 2005   TOTAL HIP ARTHROPLASTY Right 02/14/2022   Procedure: TOTAL HIP ARTHROPLASTY ANTERIOR APPROACH;  Surgeon: Renette Butters, MD;  [facility];  Service: Orthopedics;  Laterality: Right;    There were no vitals filed for this visit.   Subjective Assessment - 03/09/22 1107     Subjective Pt states he did increase his walking this weekend. He notes increased  soreness in his thighs after the leg press and sit to stand exercises from prior session.    Pertinent History Lt RCR 2022; LBP; HTN    Patient Stated Goals return to normal walking and all functional activities    Currently in Pain? Yes    Pain Location Hip    Pain Orientation Right    Pain Descriptors / Indicators Tightness    Pain Type Surgical pain    Pain Onset More than a month ago    Pain Frequency Intermittent                [facility] PT Assessment - 03/09/22 0001       Assessment   Medical Diagnosis Rt THA anterior approach    Referring Provider (PT) Dr Edmonia Lynch    Onset Date/Surgical Date 02/14/22    Hand Dominance Right                           [facility] Adult PT Treatment/Exercise - 03/09/22 0001       Knee/Hip Exercises: Stretches   Sports administrator Right;2 reps;30 seconds      Knee/Hip Exercises: Machines for Strengthening   Cybex Knee Extension  4 plates DL 3X10; 2 plates R SL 3x4    Other Machine Resisted walking orange sports cord x10 side stepping L<>R      Knee/Hip Exercises: Standing   Heel Raises Right;2 sets;10 reps;Left      Knee/Hip Exercises: Supine   Bridges Strengthening;2 sets;10 reps;Right    Bridges Limitations single leg    Other Supine Knee/Hip Exercises hip flexion 2x10 green tband      Knee/Hip Exercises: Sidelying   Hip ABduction Strengthening;Right;2 sets;10 reps    Hip ABduction Limitations green tband      Knee/Hip Exercises: Prone   Hamstring Curl 10 reps;2 sets    Hamstring Curl Limitations green tband    Hip Extension Strengthening;Right;2 sets;10 reps    Hip Extension Limitations green tband                          PT Long Term Goals - 02/16/22 1245       PT LONG TERM GOAL #1   Title Increase Rt hip ROM to WFL's and pain free in all planes    Time 6    Period Weeks    Status New    Target Date 03/30/22      PT LONG TERM GOAL #2   Title 5/5 strength Rt LE    Time 6    Period  Weeks    Status New    Target Date 03/30/22      PT LONG TERM GOAL #3   Title Independent in ambulation for community distances with least restrictive to no assistive device    Time 6    Period Weeks    Status New    Target Date 03/30/22      PT LONG TERM GOAL #4   Title Independent in HEP(including aquatic program as indicated)    Time 6    Period Weeks    Status New    Target Date 03/30/22      PT LONG TERM GOAL #5   Title Improve functional limitation score to 58    Time 6    Period Weeks    Status New    Target Date 03/30/22                   Plan - 03/09/22 1131     Clinical Impression Statement Initiated knee extension on machine. Progressed pt to green tband.    Stability/Clinical Decision Making Stable/Uncomplicated    Rehab Potential Good    PT Frequency 2x / week    PT Duration 6 weeks    PT Treatment/Interventions ADLs/Self Care Home Management;Aquatic Therapy;Cryotherapy;Electrical Stimulation;Iontophoresis '4mg'$ /ml Dexamethasone;Moist Heat;Ultrasound;Gait training;Stair training;Functional mobility training;Therapeutic activities;Therapeutic exercise;Balance training;Neuromuscular re-education;Patient/family education;Manual techniques;Passive range of motion;Scar mobilization;Dry needling;Taping    PT Next Visit Plan review and progress exercise program; gait training; ROM; strengthening as tolerated    PT Home Exercise Plan [id number]    Consulted and Agree with Plan of Care Patient             Patient will benefit from skilled therapeutic intervention in order to improve the following deficits and impairments:  Abnormal gait, Decreased activity tolerance, Pain, Decreased balance, Impaired flexibility, Decreased mobility, Decreased strength, Increased edema, Postural dysfunction  Visit Diagnosis: Pain of right hip  Abnormal gait  Other symptoms and signs involving the musculoskeletal system  Muscle weakness (generalized)     Problem  List Patient Active Problem List   Diagnosis Date Noted  S/P total right hip arthroplasty 02/14/2022   Primary osteoarthritis of right knee 09/16/2021   Primary osteoarthritis of right hip 09/16/2021   Rotator cuff tear, left 01/18/2021   Snoring 09/27/2020   Venous stasis 06/02/2020   IFG (impaired fasting glucose) 05/28/2019   Hiatal hernia 05/28/2019   BPPV (benign paroxysmal positional vertigo), left 11/26/2018   Allergic rhinitis due to pollen 11/26/2018   Lymphocytic colitis 01/15/2018   Right lumbar radiculopathy 09/12/2016   Hepatic cyst 11/11/2014   Cancer of skin, squamous cell 03/11/2014   Seborrheic dermatitis 09/05/2012   SCC (squamous cell carcinoma), leg 08/29/2011   Left cervical radiculopathy 01/02/2011   POSTURAL LIGHTHEADEDNESS 12/02/2010   CIGARETTE SMOKER 11/29/2010   Fatty liver 10/08/2009   CONSTIPATION, DRUG INDUCED 10/05/2009   DIVERTICULOSIS OF COLON 08/13/2009   [place] DISEASE, LUMBAR 07/08/2007   [place] DISEASE, CERVICAL 03/11/2007   BPH (benign prostatic hyperplasia) 02/13/2007   Hyperlipidemia 01/16/2007   ANKYLOSING SPONDYLITIS 12/05/2006   Essential hypertension, benign 12/05/2006    [facility] April Gordy Levan, PT, DPT 03/09/2022, 11:41 AM  [facility] [place] [address] [place], [place], [address] Phone: [phone]   Fax:  [phone]  Name: LESTER CRICKENBERGER MRN: [id number] Date of Birth: November 21, 1955

## 2022-03-13 ENCOUNTER — Ambulatory Visit: Payer: Medicare Other | Admitting: Physical Therapy

## 2022-03-13 DIAGNOSIS — R269 Unspecified abnormalities of gait and mobility: Secondary | ICD-10-CM

## 2022-03-13 DIAGNOSIS — R29898 Other symptoms and signs involving the musculoskeletal system: Secondary | ICD-10-CM

## 2022-03-13 DIAGNOSIS — M6281 Muscle weakness (generalized): Secondary | ICD-10-CM

## 2022-03-13 DIAGNOSIS — M25551 Pain in right hip: Secondary | ICD-10-CM | POA: Diagnosis not present

## 2022-03-13 NOTE — Therapy (Signed)
[place], [place], [address] Phone: [phone]   Fax:  [phone]  Physical Therapy Treatment  Patient Details  Name: Raymond Mitchell MRN: [id number] Date of Birth: Jan 11, 1956 Referring Provider (PT): Dr Edmonia Lynch   Encounter Date: 03/13/2022   PT End of Session - 03/13/22 0845     Visit Number 7    Number of Visits 12    Date for PT Re-Evaluation 03/30/22    Authorization Type Medicare    Authorization - Visit Number 7    Progress Note Due on Visit 10    PT Start Time 0845    PT Stop Time 0925    PT Time Calculation (min) 40 min    Activity Tolerance Patient tolerated treatment well    Behavior During Therapy [facility] for tasks assessed/performed             Past Medical History:  Diagnosis Date   Ankylosing spondylitis ([place])    BPH (benign prostatic hyperplasia)    hx of   Diverticulosis    GERD (gastroesophageal reflux disease)    History of hiatal hernia    Hypertension    Lymphocytic colitis 01/15/2018   Pes planus    Right inguinal hernia    Tobacco user    Varicose veins     Past Surgical History:  Procedure Laterality Date   BACK SURGERY  12/2007   L 4 to L 5 fusion   BACK SURGERY  2012   disc repair   c spine ESI     [name]   INGUINAL HERNIA REPAIR  1994   INGUINAL HERNIA REPAIR Right 07/16/2020   Procedure: LAPAROSCOPIC RIGHT INGUINAL HERNIA WITH MESH;  Surgeon: [name], [name], MD;  Location: [place];  Service: General;  Laterality: Right;   RTC surgery Left 2005   TOTAL HIP ARTHROPLASTY Right 02/14/2022   Procedure: TOTAL HIP ARTHROPLASTY ANTERIOR APPROACH;  Surgeon: Renette Butters, MD;  [facility];  Service: Orthopedics;  Laterality: Right;    There were no vitals filed for this visit.   Subjective Assessment - 03/13/22 0847     Subjective Pt reports he took a day off due to soreness. He did his exercises yesterday  and walked on Saturday.    Pertinent History Lt RCR 2022; LBP; HTN    Patient Stated Goals return to normal walking and all functional activities    Currently in Pain? Yes    Pain Score 3     Pain Location Hip    Pain Orientation Right    Pain Descriptors / Indicators Tightness    Pain Type Surgical pain    Pain Onset More than a month ago                [facility] PT Assessment - 03/13/22 0001       Assessment   Medical Diagnosis Rt THA anterior approach    Referring Provider (PT) Dr Edmonia Lynch    Onset Date/Surgical Date 02/14/22    Hand Dominance Right                           [facility] Adult PT Treatment/Exercise - 03/13/22 0001       Knee/Hip Exercises: Aerobic   Nustep L5 x 5 min LEs only      Knee/Hip Exercises: Machines for Strengthening   Total Gym Leg Press 7 plates DL 2I29; 5  plates SL on R 9T26      Knee/Hip Exercises: Standing   Lateral Step Up Right;Left;Step Height: 6"    Forward Step Up Right;2 sets;10 reps;Hand Hold: 1;Step Height: 6";Left    Forward Step Up Limitations Runner's step up                          PT Long Term Goals - 02/16/22 1245       PT LONG TERM GOAL #1   Title Increase Rt hip ROM to WFL's and pain free in all planes    Time 6    Period Weeks    Status New    Target Date 03/30/22      PT LONG TERM GOAL #2   Title 5/5 strength Rt LE    Time 6    Period Weeks    Status New    Target Date 03/30/22      PT LONG TERM GOAL #3   Title Independent in ambulation for community distances with least restrictive to no assistive device    Time 6    Period Weeks    Status New    Target Date 03/30/22      PT LONG TERM GOAL #4   Title Independent in HEP(including aquatic program as indicated)    Time 6    Period Weeks    Status New    Target Date 03/30/22      PT LONG TERM GOAL #5   Title Improve functional limitation score to 58    Time 6    Period Weeks    Status New    Target Date  03/30/22                   Plan - 03/13/22 0900     Clinical Impression Statement Treatment focused on continuing machine strengthening and single leg balance/stability.    Stability/Clinical Decision Making Stable/Uncomplicated    Rehab Potential Good    PT Frequency 2x / week    PT Duration 6 weeks    PT Treatment/Interventions ADLs/Self Care Home Management;Aquatic Therapy;Cryotherapy;Electrical Stimulation;Iontophoresis '4mg'$ /ml Dexamethasone;Moist Heat;Ultrasound;Gait training;Stair training;Functional mobility training;Therapeutic activities;Therapeutic exercise;Balance training;Neuromuscular re-education;Patient/family education;Manual techniques;Passive range of motion;Scar mobilization;Dry needling;Taping    PT Next Visit Plan review and progress exercise program; gait training; ROM; strengthening as tolerated    PT Home Exercise Plan [id number]    Consulted and Agree with Plan of Care Patient             Patient will benefit from skilled therapeutic intervention in order to improve the following deficits and impairments:  Abnormal gait, Decreased activity tolerance, Pain, Decreased balance, Impaired flexibility, Decreased mobility, Decreased strength, Increased edema, Postural dysfunction  Visit Diagnosis: Pain of right hip  Abnormal gait  Other symptoms and signs involving the musculoskeletal system  Muscle weakness (generalized)     Problem List Patient Active Problem List   Diagnosis Date Noted   S/P total right hip arthroplasty 02/14/2022   Primary osteoarthritis of right knee 09/16/2021   Primary osteoarthritis of right hip 09/16/2021   Rotator cuff tear, left 01/18/2021   Snoring 09/27/2020   Venous stasis 06/02/2020   IFG (impaired fasting glucose) 05/28/2019   Hiatal hernia 05/28/2019   BPPV (benign paroxysmal positional vertigo), left 11/26/2018   Allergic rhinitis due to pollen 11/26/2018   Lymphocytic colitis 01/15/2018   Right lumbar  radiculopathy 09/12/2016   Hepatic cyst 11/11/2014   Cancer of skin, squamous  cell 03/11/2014   Seborrheic dermatitis 09/05/2012   SCC (squamous cell carcinoma), leg 08/29/2011   Left cervical radiculopathy 01/02/2011   POSTURAL LIGHTHEADEDNESS 12/02/2010   CIGARETTE SMOKER 11/29/2010   Fatty liver 10/08/2009   CONSTIPATION, DRUG INDUCED 10/05/2009   DIVERTICULOSIS OF COLON 08/13/2009   DISC DISEASE, LUMBAR 07/08/2007   DISC DISEASE, CERVICAL 03/11/2007   BPH (benign prostatic hyperplasia) 02/13/2007   Hyperlipidemia 01/16/2007   ANKYLOSING SPONDYLITIS 12/05/2006   Essential hypertension, benign 12/05/2006    [facility] April Ma L Lourie Retz, PT, DPT 03/13/2022, 9:02 AM  [facility] [place], [place], [address] Phone: [phone]   Fax:  [phone]  Name: Raymond Mitchell MRN: [id number] Date of Birth: 09-10-1956

## 2022-03-16 ENCOUNTER — Ambulatory Visit: Payer: Medicare Other | Admitting: Physical Therapy

## 2022-03-16 DIAGNOSIS — M25551 Pain in right hip: Secondary | ICD-10-CM

## 2022-03-16 DIAGNOSIS — R29898 Other symptoms and signs involving the musculoskeletal system: Secondary | ICD-10-CM

## 2022-03-16 DIAGNOSIS — M6281 Muscle weakness (generalized): Secondary | ICD-10-CM

## 2022-03-16 DIAGNOSIS — R269 Unspecified abnormalities of gait and mobility: Secondary | ICD-10-CM

## 2022-03-16 NOTE — Therapy (Signed)
[facility] [place], [place], [address] Phone: [phone]   Fax:  [phone]  Physical Therapy Treatment  Patient Details  Name: Raymond Mitchell MRN: [id number] Date of Birth: 10-26-1955 Referring Provider (PT): Dr Edmonia Lynch   Encounter Date: 03/16/2022   PT End of Session - 03/16/22 0844     Visit Number 8    Number of Visits 12    Date for PT Re-Evaluation 03/30/22    Authorization Type Medicare    Authorization - Visit Number 8    Progress Note Due on Visit 10    PT Start Time 0845    PT Stop Time 0925    PT Time Calculation (min) 40 min    Activity Tolerance Patient tolerated treatment well    Behavior During Therapy [facility] for tasks assessed/performed             Past Medical History:  Diagnosis Date   Ankylosing spondylitis ([place])    BPH (benign prostatic hyperplasia)    hx of   Diverticulosis    GERD (gastroesophageal reflux disease)    History of hiatal hernia    Hypertension    Lymphocytic colitis 01/15/2018   Pes planus    Right inguinal hernia    Tobacco user    Varicose veins     Past Surgical History:  Procedure Laterality Date   BACK SURGERY  12/2007   L 4 to L 5 fusion   BACK SURGERY  2012   disc repair   c spine ESI     [name]   INGUINAL HERNIA REPAIR  1994   INGUINAL HERNIA REPAIR Right 07/16/2020   Procedure: LAPAROSCOPIC RIGHT INGUINAL HERNIA WITH MESH;  Surgeon: [name], [name], MD;  Location: [facility];  Service: General;  Laterality: Right;   RTC surgery Left 2005   TOTAL HIP ARTHROPLASTY Right 02/14/2022   Procedure: TOTAL HIP ARTHROPLASTY ANTERIOR APPROACH;  Surgeon: Renette Butters, MD;  Location: [facility];  Service: Orthopedics;  Laterality: Right;    There were no vitals filed for this visit.   Subjective Assessment - 03/16/22 0847     Subjective Pt states he is doing everything now. Has not yet gotten his treadmill ready  for use.    Pertinent History Lt RCR 2022; LBP; HTN    Patient Stated Goals return to normal walking and all functional activities    Currently in Pain? No/denies    Pain Onset More than a month ago                [facility] PT Assessment - 03/16/22 0001       Assessment   Medical Diagnosis Rt THA anterior approach    Referring Provider (PT) Dr Edmonia Lynch    Onset Date/Surgical Date 02/14/22    Hand Dominance Right                           [facility] Adult PT Treatment/Exercise - 03/16/22 0001       Knee/Hip Exercises: Stretches   Passive Hamstring Stretch Right;2 reps;30 seconds    Passive Hamstring Stretch Limitations seated    Quad Stretch Right;2 reps;30 seconds    Piriformis Stretch Right;Left;30 seconds;2 reps      Knee/Hip Exercises: Aerobic   Nustep L5 x 5 min LEs only      Knee/Hip Exercises: Standing   SLS with Vectors x10 bouncing green ball off  rebounder forward, L, and R. Cone tap forward, side and back x10    Other Standing Knee Exercises lateral stepping blue tband 2x15'    Other Standing Knee Exercises modified deadlift 2x10 10# KB      Knee/Hip Exercises: Seated   Sit to Sand 10 reps   x10 with 10# KB and then 2x10 with 15# KB                         PT Long Term Goals - 02/16/22 1245       PT LONG TERM GOAL #1   Title Increase Rt hip ROM to WFL's and pain free in all planes    Time 6    Period Weeks    Status New    Target Date 03/30/22      PT LONG TERM GOAL #2   Title 5/5 strength Rt LE    Time 6    Period Weeks    Status New    Target Date 03/30/22      PT LONG TERM GOAL #3   Title Independent in ambulation for community distances with least restrictive to no assistive device    Time 6    Period Weeks    Status New    Target Date 03/30/22      PT LONG TERM GOAL #4   Title Independent in HEP(including aquatic program as indicated)    Time 6    Period Weeks    Status New    Target Date 03/30/22       PT LONG TERM GOAL #5   Title Improve functional limitation score to 58    Time 6    Period Weeks    Status New    Target Date 03/30/22                   Plan - 03/16/22 0914     Clinical Impression Statement Initiated modified lifting. Continued to work on single leg stability and balance with perturbations and weight shifting.    Stability/Clinical Decision Making Stable/Uncomplicated    Rehab Potential Good    PT Frequency 2x / week    PT Duration 6 weeks    PT Treatment/Interventions ADLs/Self Care Home Management;Aquatic Therapy;Cryotherapy;Electrical Stimulation;Iontophoresis '4mg'$ /ml Dexamethasone;Moist Heat;Ultrasound;Gait training;Stair training;Functional mobility training;Therapeutic activities;Therapeutic exercise;Balance training;Neuromuscular re-education;Patient/family education;Manual techniques;Passive range of motion;Scar mobilization;Dry needling;Taping    PT Next Visit Plan review and progress exercise program; gait training; ROM; strengthening as tolerated    PT Home Exercise Plan [id number]    Consulted and Agree with Plan of Care Patient             Patient will benefit from skilled therapeutic intervention in order to improve the following deficits and impairments:  Abnormal gait, Decreased activity tolerance, Pain, Decreased balance, Impaired flexibility, Decreased mobility, Decreased strength, Increased edema, Postural dysfunction  Visit Diagnosis: Pain of right hip  Abnormal gait  Other symptoms and signs involving the musculoskeletal system  Muscle weakness (generalized)     Problem List Patient Active Problem List   Diagnosis Date Noted   S/P total right hip arthroplasty 02/14/2022   Primary osteoarthritis of right knee 09/16/2021   Primary osteoarthritis of right hip 09/16/2021   Rotator cuff tear, left 01/18/2021   Snoring 09/27/2020   Venous stasis 06/02/2020   IFG (impaired fasting glucose) 05/28/2019   Hiatal hernia  05/28/2019   BPPV (benign paroxysmal positional vertigo), left 11/26/2018   Allergic rhinitis due  to pollen 11/26/2018   Lymphocytic colitis 01/15/2018   Right lumbar radiculopathy 09/12/2016   Hepatic cyst 11/11/2014   Cancer of skin, squamous cell 03/11/2014   Seborrheic dermatitis 09/05/2012   SCC (squamous cell carcinoma), leg 08/29/2011   Left cervical radiculopathy 01/02/2011   POSTURAL LIGHTHEADEDNESS 12/02/2010   CIGARETTE SMOKER 11/29/2010   Fatty liver 10/08/2009   CONSTIPATION, DRUG INDUCED 10/05/2009   DIVERTICULOSIS OF COLON 08/13/2009   DISC DISEASE, LUMBAR 07/08/2007   DISC DISEASE, CERVICAL 03/11/2007   BPH (benign prostatic hyperplasia) 02/13/2007   Hyperlipidemia 01/16/2007   ANKYLOSING SPONDYLITIS 12/05/2006   Essential hypertension, benign 12/05/2006    [facility] April Gordy Levan, PT, DPT 03/16/2022, 9:29 AM  [facility] [address] [place], [place], [address] Phone: [phone]   Fax:  [phone]  Name: SURYA SCHROETER MRN: [id number] Date of Birth: 1955-11-07

## 2022-03-17 ENCOUNTER — Ambulatory Visit (INDEPENDENT_AMBULATORY_CARE_PROVIDER_SITE_OTHER): Payer: Medicare Other | Admitting: Physician Assistant

## 2022-03-17 ENCOUNTER — Encounter: Payer: Self-pay | Admitting: Physician Assistant

## 2022-03-17 VITALS — Ht 72.0 in | Wt 186.0 lb

## 2022-03-17 DIAGNOSIS — I951 Orthostatic hypotension: Secondary | ICD-10-CM | POA: Insufficient documentation

## 2022-03-17 DIAGNOSIS — Z96641 Presence of right artificial hip joint: Secondary | ICD-10-CM | POA: Diagnosis not present

## 2022-03-17 DIAGNOSIS — R42 Dizziness and giddiness: Secondary | ICD-10-CM

## 2022-03-17 NOTE — Patient Instructions (Addendum)
Decrease lisinopril to '20mg'$  daily  Orthostatic Hypotension Blood pressure is a measurement of how strongly, or weakly, your circulating blood is pressing against the walls of your arteries. Orthostatic hypotension is a drop in blood pressure that can happen when you change positions, such as when you go from lying down to standing. Arteries are blood vessels that carry blood from your heart throughout your body. When blood pressure is too low, you may not get enough blood to your brain or to the rest of your organs. Orthostatic hypotension can cause light-headedness, sweating, rapid heartbeat, blurred vision, and fainting. These symptoms require further investigation into the cause. What are the causes? Orthostatic hypotension can be caused by many things, including: Sudden changes in posture, such as standing up quickly after you have been sitting or lying down. Loss of blood (anemia) or loss of body fluids (dehydration). Heart problems, neurologic problems, or hormone problems. Pregnancy. Aging. The risk for this condition increases as you get older. Severe infection (sepsis). Certain medicines, such as medicines for high blood pressure or medicines that make the body lose excess fluids (diuretics). What are the signs or symptoms? Symptoms of this condition may include: Weakness, light-headedness, or dizziness. Sweating. Blurred vision. Tiredness (fatigue). Rapid heartbeat. Fainting, in severe cases. How is this diagnosed? This condition is diagnosed based on: Your symptoms and medical history. Your blood pressure measurements. Your health care provider will check your blood pressure when you are: Lying down. Sitting. Standing. A blood pressure reading is recorded as two numbers, such as "120 over 80" (or 120/80). The first ("top") number is called the systolic pressure. It is a measure of the pressure in your arteries as your heart beats. The second ("bottom") number is called the  diastolic pressure. It is a measure of the pressure in your arteries when your heart relaxes between beats. Blood pressure is measured in a unit called mmHg. Healthy blood pressure for most adults is 120/80 mmHg. Orthostatic hypotension is defined as a 20 mmHg drop in systolic pressure or a 10 mmHg drop in diastolic pressure within 3 minutes of standing. Other information or tests that may be used to diagnose orthostatic hypotension include: Your other vital signs, such as your heart rate and temperature. Blood tests. An electrocardiogram (ECG) or echocardiogram. A Holter monitor. This is a device you wear that records your heart rhythm continuously, usually for 24-48 hours. Tilt table test. For this test, you will be safely secured to a table that moves you from a lying position to an upright position. Your heart rhythm and blood pressure will be monitored during the test. How is this treated? This condition may be treated by: Changing your diet. This may involve eating more salt (sodium) or drinking more water. Changing the dosage of certain medicines you are taking that might be lowering your blood pressure. Correcting the underlying reason for the orthostatic hypotension. Wearing compression stockings. Taking medicines to raise your blood pressure. Avoiding actions that trigger symptoms. Follow these instructions at home: Medicines Take over-the-counter and prescription medicines only as told by your health care provider. Follow instructions from your health care provider about changing the dosage of your current medicines, if this applies. Do not stop or adjust any of your medicines on your own. Eating and drinking  Drink enough fluid to keep your urine pale yellow. Eat extra salt only as directed. Do not add extra salt to your diet unless advised by your health care provider. Eat frequent, small meals. Avoid standing up  suddenly after eating. General instructions  Get up slowly from  lying down or sitting positions. This gives your blood pressure a chance to adjust. Avoid hot showers and excessive heat as directed by your health care provider. Engage in regular physical activity as directed by your health care provider. If you have compression stockings, wear them as told. Keep all follow-up visits. This is important. Contact a health care provider if: You have a fever for more than 2-3 days. You feel more thirsty than usual. You feel dizzy or weak. Get help right away if: You have chest pain. You have a fast or irregular heartbeat. You become sweaty or feel light-headed. You feel short of breath. You faint. You have any symptoms of a stroke. "BE FAST" is an easy way to remember the main warning signs of a stroke: B - Balance. Signs are dizziness, sudden trouble walking, or loss of balance. E - Eyes. Signs are trouble seeing or a sudden change in vision. F - Face. Signs are sudden weakness or numbness of the face, or the face or eyelid drooping on one side. A - Arms. Signs are weakness or numbness in an arm. This happens suddenly and usually on one side of the body. S - Speech. Signs are sudden trouble speaking, slurred speech, or trouble understanding what people say. T - Time. Time to call emergency services. Write down what time symptoms started. You have other signs of a stroke, such as: A sudden, severe headache with no known cause. Nausea or vomiting. Seizure. These symptoms may represent a serious problem that is an emergency. Do not wait to see if the symptoms will go away. Get medical help right away. Call your local emergency services (911 in the [place] Do not drive yourself to the hospital. Summary Orthostatic hypotension is a sudden drop in blood pressure. It can cause light-headedness, sweating, rapid heartbeat, blurred vision, and fainting. Orthostatic hypotension can be diagnosed by having your blood pressure taken while lying down, sitting, and then  standing. Treatment may involve changing your diet, wearing compression stockings, sitting up slowly, adjusting your medicines, or correcting the underlying reason for the orthostatic hypotension. Get help right away if you have chest pain, a fast or irregular heartbeat, or symptoms of a stroke. This information is not intended to replace advice given to you by your health care provider. Make sure you discuss any questions you have with your health care provider. Document Revised: 12/02/2020 Document Reviewed: 12/02/2020 Elsevier Patient Education  [place].

## 2022-03-17 NOTE — Progress Notes (Signed)
Acute Office Visit  Subjective:     Patient ID: Raymond Mitchell, male    DOB: 1956/05/11, 66 y.o.   MRN: [id number]  No chief complaint on file.   HPI Patient is in today for dizziness. He had right hip replacement on 02/14/2022. Since hip replacement he has been much more active. He has noticed more and more dizziness and lower BP. He denies any syncope. Worse with position changes. He is checking his BP at home and has readings 100's over 70s. Denies any SOB, CP, palpitations. He is on lisinopril and metoprolol and wonders if something needs to be adjusted.   .. Active Ambulatory Problems    Diagnosis Date Noted   Hyperlipidemia 01/16/2007   DIVERTICULOSIS OF COLON 08/13/2009   CONSTIPATION, DRUG INDUCED 10/05/2009   Fatty liver 10/08/2009   BPH (benign prostatic hyperplasia) 02/13/2007   ANKYLOSING SPONDYLITIS 12/05/2006   DISC DISEASE, CERVICAL 03/11/2007   [place] DISEASE, LUMBAR 07/08/2007   Essential hypertension, benign 12/05/2006   CIGARETTE SMOKER 11/29/2010   POSTURAL LIGHTHEADEDNESS 12/02/2010   Left cervical radiculopathy 01/02/2011   SCC (squamous cell carcinoma), leg 08/29/2011   Seborrheic dermatitis 09/05/2012   Cancer of skin, squamous cell 03/11/2014   Hepatic cyst 11/11/2014   Right lumbar radiculopathy 09/12/2016   Lymphocytic colitis 01/15/2018   BPPV (benign paroxysmal positional vertigo), left 11/26/2018   Allergic rhinitis due to pollen 11/26/2018   IFG (impaired fasting glucose) 05/28/2019   Hiatal hernia 05/28/2019   Venous stasis 06/02/2020   Snoring 09/27/2020   Rotator cuff tear, left 01/18/2021   Primary osteoarthritis of right knee 09/16/2021   Primary osteoarthritis of right hip 09/16/2021   S/P total right hip arthroplasty 02/14/2022   Orthostatic hypotension 03/17/2022   Resolved Ambulatory Problems    Diagnosis Date Noted   NEOPLASM, SKIN, UNCERTAIN BEHAVIOR 78/93/8101   VARICOSE VEIN 12/05/2006   RECTAL BLEEDING 12/13/2006   LEG  PAIN, RIGHT 12/05/2006   Abdominal pain, generalized 10/07/2009   LIVER FUNCTION TESTS, ABNORMAL 07/08/2007   VIRAL URI 10/25/2010   CONSTIPATION 10/25/2010   Abdominal pain, acute, bilateral lower quadrant 11/11/2014   Past Medical History:  Diagnosis Date   Diverticulosis    GERD (gastroesophageal reflux disease)    History of hiatal hernia    Hypertension    Pes planus    Right inguinal hernia    Tobacco user    Varicose veins      ROS  See HPI>     Objective:    Ht 6' (1.829 m)   Wt 186 lb (84.4 kg)   BMI 25.23 kg/m  BP Readings from Last 3 Encounters:  02/14/22 133/78  02/06/22 121/76  11/30/21 138/89   Wt Readings from Last 3 Encounters:  03/17/22 186 lb (84.4 kg)  02/14/22 177 lb (80.3 kg)  02/06/22 177 lb (80.3 kg)    .Marland KitchenOrthostatic VS for the past 72 hrs (Last 3 readings):  Orthostatic BP Patient Position Orthostatic Pulse  03/17/22 1011 (!) 89/59 Standing 76  03/17/22 1010 116/74 Sitting 68  03/17/22 1008 106/68 Supine 67     Physical Exam Vitals reviewed.  Constitutional:      Appearance: Normal appearance.  HENT:     Head: Normocephalic.  Cardiovascular:     Rate and Rhythm: Normal rate and regular rhythm.     Pulses: Normal pulses.     Heart sounds: Normal heart sounds.  Pulmonary:     Effort: Pulmonary effort is normal.     Breath  sounds: Normal breath sounds.  Musculoskeletal:     Right lower leg: No edema.     Left lower leg: No edema.  Neurological:     General: No focal deficit present.     Mental Status: He is alert and oriented to person, place, and time.  Psychiatric:        Mood and Affect: Mood normal.           Assessment & Plan:  Marland KitchenMarland KitchenDiagnoses and all orders for this visit:  Orthostatic hypotension -     CBC with Differential/Platelet -     Fe+TIBC+Fer -     BASIC METABOLIC PANEL WITH GFR  S/P hip replacement, right  Dizziness -     CBC with Differential/Platelet -     Fe+TIBC+Fer -     BASIC METABOLIC  PANEL WITH GFR    HO given.  Will check CBC/ferritin/BMP BP overall low cut lisinopril in half to '20mg'$  daily Keep checking at home Make sure to stay hydrated Increase a little salt in diet especially when feeling dizzy or BP low Consider compression stockings  Follow up with P[facility] in 2 weeks.     Return in about 2 weeks (around 03/31/2022).  Iran Planas, PA-C

## 2022-03-18 LAB — CBC WITH DIFFERENTIAL/PLATELET
Absolute Monocytes: 864 cells/uL (ref 200–950)
Basophils Absolute: 72 cells/uL (ref 0–200)
Basophils Relative: 0.9 %
Eosinophils Absolute: 328 cells/uL (ref 15–500)
Eosinophils Relative: 4.1 %
HCT: 37 % — ABNORMAL LOW (ref 38.5–50.0)
Hemoglobin: 12.9 g/dL — ABNORMAL LOW (ref 13.2–17.1)
Lymphs Abs: 1904 cells/uL (ref 850–3900)
MCH: 34.4 pg — ABNORMAL HIGH (ref 27.0–33.0)
MCHC: 34.9 g/dL (ref 32.0–36.0)
MCV: 98.7 fL (ref 80.0–100.0)
MPV: 9.5 fL (ref 7.5–12.5)
Monocytes Relative: 10.8 %
Neutro Abs: 4832 cells/uL (ref 1500–7800)
Neutrophils Relative %: 60.4 %
Platelets: 211 10*3/uL (ref 140–400)
RBC: 3.75 10*6/uL — ABNORMAL LOW (ref 4.20–5.80)
RDW: 12.8 % (ref 11.0–15.0)
Total Lymphocyte: 23.8 %
WBC: 8 10*3/uL (ref 3.8–10.8)

## 2022-03-18 LAB — BASIC METABOLIC PANEL WITH GFR
BUN/Creatinine Ratio: 19 (calc) (ref 6–22)
BUN: 28 mg/dL — ABNORMAL HIGH (ref 7–25)
CO2: 26 mmol/L (ref 20–32)
Calcium: 8.8 mg/dL (ref 8.6–10.3)
Chloride: 107 mmol/L (ref 98–110)
Creat: 1.46 mg/dL — ABNORMAL HIGH (ref 0.70–1.35)
Glucose, Bld: 97 mg/dL (ref 65–99)
Potassium: 4.6 mmol/L (ref 3.5–5.3)
Sodium: 139 mmol/L (ref 135–146)
eGFR: 53 mL/min/{1.73_m2} — ABNORMAL LOW (ref >=60)

## 2022-03-18 LAB — IRON,TIBC AND FERRITIN PANEL
%SAT: 50 % (calc) — ABNORMAL HIGH (ref 20–48)
Ferritin: 75 ng/mL (ref 24–380)
Iron: 138 ug/dL (ref 50–180)
TIBC: 275 mcg/dL (calc) (ref 250–425)

## 2022-03-20 ENCOUNTER — Ambulatory Visit: Payer: Medicare Other | Admitting: Physical Therapy

## 2022-03-20 DIAGNOSIS — M25551 Pain in right hip: Secondary | ICD-10-CM | POA: Diagnosis not present

## 2022-03-20 DIAGNOSIS — R269 Unspecified abnormalities of gait and mobility: Secondary | ICD-10-CM

## 2022-03-20 DIAGNOSIS — M6281 Muscle weakness (generalized): Secondary | ICD-10-CM

## 2022-03-20 DIAGNOSIS — R29898 Other symptoms and signs involving the musculoskeletal system: Secondary | ICD-10-CM

## 2022-03-20 DIAGNOSIS — Z9889 Other specified postprocedural states: Secondary | ICD-10-CM

## 2022-03-20 NOTE — Therapy (Signed)
[place], [place], [address] Phone: [phone]   Fax:  [phone]  Physical Therapy Treatment Rationale for Evaluation and Treatment Rehabilitation  Patient Details  Name: Raymond Mitchell MRN: [id number] Date of Birth: [birth year] Referring Provider (PT): Dr Edmonia Lynch   Encounter Date: 03/20/2022   PT End of Session - 03/20/22 1019     Visit Number 9    Number of Visits 12    Date for PT Re-Evaluation 03/30/22    Authorization Type Medicare    Authorization - Visit Number 9    Progress Note Due on Visit 10    PT Start Time 9211    PT Stop Time 1100    PT Time Calculation (min) 41 min    Activity Tolerance Patient tolerated treatment well    Behavior During Therapy [facility] for tasks assessed/performed             Past Medical History:  Diagnosis Date   Ankylosing spondylitis ([place])    BPH (benign prostatic hyperplasia)    hx of   Diverticulosis    GERD (gastroesophageal reflux disease)    History of hiatal hernia    Hypertension    Lymphocytic colitis 01/15/2018   Pes planus    Right inguinal hernia    Tobacco user    Varicose veins     Past Surgical History:  Procedure Laterality Date   BACK SURGERY  12/2007   L 4 to L 5 fusion   BACK SURGERY  2012   disc repair   c spine ESI     [name]   INGUINAL HERNIA REPAIR  1994   INGUINAL HERNIA REPAIR Right 07/16/2020   Procedure: LAPAROSCOPIC RIGHT INGUINAL HERNIA WITH MESH;  Surgeon: [name], [name], MD;  Location: [place];  Service: General;  Laterality: Right;   RTC surgery Left 2005   TOTAL HIP ARTHROPLASTY Right 02/14/2022   Procedure: TOTAL HIP ARTHROPLASTY ANTERIOR APPROACH;  Surgeon: Renette Butters, MD;  [facility];  Service: Orthopedics;  Laterality: Right;    There were no vitals filed for this visit.   Subjective Assessment - 03/20/22 1022     Subjective Pt reports his low  back muscles tightened up this morning. Back is bothering him today especially after turning suddenly in the kitchen. Went to MD in regards to his lightheadedness -- lisinopril has been cut in half.    Pertinent History Lt RCR 2022; LBP; HTN    Patient Stated Goals return to normal walking and all functional activities    Currently in Pain? Yes    Pain Score 7     Pain Location Back    Pain Orientation Left    Pain Descriptors / Indicators Tightness    Pain Type Chronic pain    Pain Onset More than a month ago    Pain Frequency Intermittent                [facility] PT Assessment - 03/20/22 0001       Assessment   Medical Diagnosis Rt THA anterior approach    Referring Provider (PT) Dr Edmonia Lynch    Onset Date/Surgical Date 02/14/22    Hand Dominance Right                           [facility] Adult PT Treatment/Exercise - 03/20/22 0001       Knee/Hip Exercises: Stretches  Passive Hamstring Stretch Right;Left;30 seconds    Hip Flexor Stretch Right;30 seconds;Left    Other Knee/Hip Stretches figure 4 stretch 2x30 sec    Other Knee/Hip Stretches pball forward flexion 2x30 sec center, R, L      Knee/Hip Exercises: Supine   Other Supine Knee/Hip Exercises hamstring curl + bridge 2x10 on green pball    Other Supine Knee/Hip Exercises knee flex/ext with ab set 2x10      Knee/Hip Exercises: Sidelying   Clams green tband 2x10 R & L      Knee/Hip Exercises: Prone   Other Prone Exercises 2x20 sec forearm plank      Manual Therapy   Manual therapy comments STM and TPR L QL                          PT Long Term Goals - 02/16/22 1245       PT LONG TERM GOAL #1   Title Increase Rt hip ROM to WFL's and pain free in all planes    Time 6    Period Weeks    Status New    Target Date 03/30/22      PT LONG TERM GOAL #2   Title 5/5 strength Rt LE    Time 6    Period Weeks    Status New    Target Date 03/30/22      PT LONG TERM GOAL #3    Title Independent in ambulation for community distances with least restrictive to no assistive device    Time 6    Period Weeks    Status New    Target Date 03/30/22      PT LONG TERM GOAL #4   Title Independent in HEP(including aquatic program as indicated)    Time 6    Period Weeks    Status New    Target Date 03/30/22      PT LONG TERM GOAL #5   Title Improve functional limitation score to 58    Time 6    Period Weeks    Status New    Target Date 03/30/22                   Plan - 03/20/22 1057     Clinical Impression Statement Continued to work on pelvic/core mobility and strengthening this session. Manual work through [employer].    Stability/Clinical Decision Making Stable/Uncomplicated    Rehab Potential Good    PT Frequency 2x / week    PT Duration 6 weeks    PT Treatment/Interventions ADLs/Self Care Home Management;Aquatic Therapy;Cryotherapy;Electrical Stimulation;Iontophoresis '4mg'$ /ml Dexamethasone;Moist Heat;Ultrasound;Gait training;Stair training;Functional mobility training;Therapeutic activities;Therapeutic exercise;Balance training;Neuromuscular re-education;Patient/family education;Manual techniques;Passive range of motion;Scar mobilization;Dry needling;Taping    PT Next Visit Plan review and progress exercise program; gait training; ROM; strengthening as tolerated    PT Home Exercise Plan [id number]    Consulted and Agree with Plan of Care Patient             Patient will benefit from skilled therapeutic intervention in order to improve the following deficits and impairments:  Abnormal gait, Decreased activity tolerance, Pain, Decreased balance, Impaired flexibility, Decreased mobility, Decreased strength, Increased edema, Postural dysfunction  Visit Diagnosis: Pain of right hip  Abnormal gait  Other symptoms and signs involving the musculoskeletal system  Muscle weakness (generalized)  Status post left rotator cuff repair     Problem  List Patient Active Problem List   Diagnosis Date  Noted   Orthostatic hypotension 03/17/2022   S/P total right hip arthroplasty 02/14/2022   Primary osteoarthritis of right knee 09/16/2021   Primary osteoarthritis of right hip 09/16/2021   Rotator cuff tear, left 01/18/2021   Snoring 09/27/2020   Venous stasis 06/02/2020   IFG (impaired fasting glucose) 05/28/2019   Hiatal hernia 05/28/2019   BPPV (benign paroxysmal positional vertigo), left 11/26/2018   Allergic rhinitis due to pollen 11/26/2018   Lymphocytic colitis 01/15/2018   Right lumbar radiculopathy 09/12/2016   Hepatic cyst 11/11/2014   Cancer of skin, squamous cell 03/11/2014   Seborrheic dermatitis 09/05/2012   SCC (squamous cell carcinoma), leg 08/29/2011   Left cervical radiculopathy 01/02/2011   POSTURAL LIGHTHEADEDNESS 12/02/2010   CIGARETTE SMOKER 11/29/2010   Fatty liver 10/08/2009   CONSTIPATION, DRUG INDUCED 10/05/2009   DIVERTICULOSIS OF COLON 08/13/2009   [place] DISEASE, LUMBAR 07/08/2007   [place] DISEASE, CERVICAL 03/11/2007   BPH (benign prostatic hyperplasia) 02/13/2007   Hyperlipidemia 01/16/2007   ANKYLOSING SPONDYLITIS 12/05/2006   Essential hypertension, benign 12/05/2006    [facility] April Gordy Levan, PT, DPT 03/20/2022, 10:59 AM  [facility] [place] [address] [place], [place], [address] Phone: [phone]   Fax:  [phone]  Name: Raymond Mitchell MRN: [id number] Date of Birth: 01-May-1956

## 2022-03-20 NOTE — Progress Notes (Signed)
Electrolytes look great.  Kidney function is down some but your kidneys look dry. Make sure drinking plenty of water. Your PCP can recheck this at your follow up visit. Avoid any NSAIDs.  Serum iron and iron stores look good.  Hemoglobin down from 1 month ago but just by a little.

## 2022-03-22 ENCOUNTER — Ambulatory Visit: Payer: Medicare Other | Admitting: Physical Therapy

## 2022-03-22 DIAGNOSIS — R29898 Other symptoms and signs involving the musculoskeletal system: Secondary | ICD-10-CM

## 2022-03-22 DIAGNOSIS — M25551 Pain in right hip: Secondary | ICD-10-CM

## 2022-03-22 DIAGNOSIS — Z9889 Other specified postprocedural states: Secondary | ICD-10-CM

## 2022-03-22 DIAGNOSIS — R269 Unspecified abnormalities of gait and mobility: Secondary | ICD-10-CM

## 2022-03-22 DIAGNOSIS — M6281 Muscle weakness (generalized): Secondary | ICD-10-CM

## 2022-03-22 DIAGNOSIS — R293 Abnormal posture: Secondary | ICD-10-CM

## 2022-03-22 NOTE — Therapy (Signed)
[place], [place], [address] Phone: [phone]   Fax:  [phone]  Physical Therapy Treatment  Patient Details  Name: Raymond Mitchell MRN: [id number] Date of Birth: 1955/12/08 Referring Provider (PT): Dr Edmonia Lynch   Encounter Date: 03/22/2022   PT End of Session - 03/22/22 1634     Visit Number 10    Number of Visits 12    Date for PT Re-Evaluation 03/30/22    Authorization Type Medicare    Authorization - Visit Number 10    Progress Note Due on Visit 10    PT Start Time 4034    PT Stop Time 7425    PT Time Calculation (min) 45 min    Activity Tolerance Patient tolerated treatment well    Behavior During Therapy [facility] for tasks assessed/performed             Past Medical History:  Diagnosis Date   Ankylosing spondylitis ([place])    BPH (benign prostatic hyperplasia)    hx of   Diverticulosis    GERD (gastroesophageal reflux disease)    History of hiatal hernia    Hypertension    Lymphocytic colitis 01/15/2018   Pes planus    Right inguinal hernia    Tobacco user    Varicose veins     Past Surgical History:  Procedure Laterality Date   BACK SURGERY  12/2007   L 4 to L 5 fusion   BACK SURGERY  2012   disc repair   c spine ESI     [name]   INGUINAL HERNIA REPAIR  1994   INGUINAL HERNIA REPAIR Right 07/16/2020   Procedure: LAPAROSCOPIC RIGHT INGUINAL HERNIA WITH MESH;  Surgeon: [name], [name], MD;  Location: [place];  Service: General;  Laterality: Right;   RTC surgery Left 2005   TOTAL HIP ARTHROPLASTY Right 02/14/2022   Procedure: TOTAL HIP ARTHROPLASTY ANTERIOR APPROACH;  Surgeon: Renette Butters, MD;  [facility];  Service: Orthopedics;  Laterality: Right;    There were no vitals filed for this visit.   Subjective Assessment - 03/22/22 1538     Subjective Pt states he woke up yesterday and was unable to move due to his back. Pt  was able to do stretches, walk and exercise to decrease the pain. Today he has not been able to get it to calm down completely.    Pertinent History Lt RCR 2022; LBP; HTN    Patient Stated Goals return to normal walking and all functional activities    Pain Onset More than a month ago                [facility] PT Assessment - 03/22/22 0001       Assessment   Medical Diagnosis Rt THA anterior approach    Referring Provider (PT) Dr Edmonia Lynch    Onset Date/Surgical Date 02/14/22    Hand Dominance Right                           [facility] Adult PT Treatment/Exercise - 03/22/22 0001       Knee/Hip Exercises: Stretches   Hip Flexor Stretch Left;2 reps;30 seconds    Hip Flexor Stretch Limitations supine and then again in seated    Other Knee/Hip Stretches QL stretch sidelying 2x30 sec      Knee/Hip Exercises: Prone   Hip Extension Strengthening;2 sets;10 reps;Left  Manual Therapy   Manual therapy comments STM and TPR L QL, iliopsoas, lumbar paraspinals. Skilled assessment and palpation for TPDN              Trigger Point Dry Needling - 03/22/22 0001     Consent Given? Yes    Education Handout Provided Previously provided    Muscles Treated Back/Hip Gluteus maximus;Quadratus lumborum    Gluteus Maximus Response Palpable increased muscle length    Quadratus Lumborum Response Palpable increased muscle length                        PT Long Term Goals - 02/16/22 1245       PT LONG TERM GOAL #1   Title Increase Rt hip ROM to WFL's and pain free in all planes    Time 6    Period Weeks    Status New    Target Date 03/30/22      PT LONG TERM GOAL #2   Title 5/5 strength Rt LE    Time 6    Period Weeks    Status New    Target Date 03/30/22      PT LONG TERM GOAL #3   Title Independent in ambulation for community distances with least restrictive to no assistive device    Time 6    Period Weeks    Status New    Target Date 03/30/22       PT LONG TERM GOAL #4   Title Independent in HEP(including aquatic program as indicated)    Time 6    Period Weeks    Status New    Target Date 03/30/22      PT LONG TERM GOAL #5   Title Improve functional limitation score to 58    Time 6    Period Weeks    Status New    Target Date 03/30/22                   Plan - 03/22/22 1633     Clinical Impression Statement Lumbar pain limiting pt at this time. Continued hip stretching. Heavy manual work and TPDN to try and address back pain as this is limiting working on his hip.    Stability/Clinical Decision Making Stable/Uncomplicated    Rehab Potential Good    PT Frequency 2x / week    PT Duration 6 weeks    PT Treatment/Interventions ADLs/Self Care Home Management;Aquatic Therapy;Cryotherapy;Electrical Stimulation;Iontophoresis '4mg'$ /ml Dexamethasone;Moist Heat;Ultrasound;Gait training;Stair training;Functional mobility training;Therapeutic activities;Therapeutic exercise;Balance training;Neuromuscular re-education;Patient/family education;Manual techniques;Passive range of motion;Scar mobilization;Dry needling;Taping    PT Next Visit Plan review and progress exercise program; gait training; ROM; strengthening as tolerated    PT Home Exercise Plan [id number]    Consulted and Agree with Plan of Care Patient             Patient will benefit from skilled therapeutic intervention in order to improve the following deficits and impairments:  Abnormal gait, Decreased activity tolerance, Pain, Decreased balance, Impaired flexibility, Decreased mobility, Decreased strength, Increased edema, Postural dysfunction  Visit Diagnosis: Pain of right hip  Abnormal gait  Other symptoms and signs involving the musculoskeletal system  Muscle weakness (generalized)  Status post left rotator cuff repair  Abnormal posture     Problem List Patient Active Problem List   Diagnosis Date Noted   Orthostatic hypotension 03/17/2022    S/P total right hip arthroplasty 02/14/2022   Primary osteoarthritis of right knee 09/16/2021  Primary osteoarthritis of right hip 09/16/2021   Rotator cuff tear, left 01/18/2021   Snoring 09/27/2020   Venous stasis 06/02/2020   IFG (impaired fasting glucose) 05/28/2019   Hiatal hernia 05/28/2019   BPPV (benign paroxysmal positional vertigo), left 11/26/2018   Allergic rhinitis due to pollen 11/26/2018   Lymphocytic colitis 01/15/2018   Right lumbar radiculopathy 09/12/2016   Hepatic cyst 11/11/2014   Cancer of skin, squamous cell 03/11/2014   Seborrheic dermatitis 09/05/2012   SCC (squamous cell carcinoma), leg 08/29/2011   Left cervical radiculopathy 01/02/2011   POSTURAL LIGHTHEADEDNESS 12/02/2010   CIGARETTE SMOKER 11/29/2010   Fatty liver 10/08/2009   CONSTIPATION, DRUG INDUCED 10/05/2009   DIVERTICULOSIS OF COLON 08/13/2009   [place] DISEASE, LUMBAR 07/08/2007   [place] DISEASE, CERVICAL 03/11/2007   BPH (benign prostatic hyperplasia) 02/13/2007   Hyperlipidemia 01/16/2007   ANKYLOSING SPONDYLITIS 12/05/2006   Essential hypertension, benign 12/05/2006    [facility] April Gordy Levan, PT, DPT 03/22/2022, 4:35 PM  [facility] [place] [address] [place], [place], [address] Phone: [phone]   Fax:  [phone]  Name: KAHLIL COWANS MRN: [id number] Date of Birth: 1956-08-12

## 2022-03-27 ENCOUNTER — Ambulatory Visit: Payer: Medicare Other | Admitting: Physical Therapy

## 2022-03-27 DIAGNOSIS — M6281 Muscle weakness (generalized): Secondary | ICD-10-CM

## 2022-03-27 DIAGNOSIS — M25551 Pain in right hip: Secondary | ICD-10-CM

## 2022-03-27 DIAGNOSIS — R269 Unspecified abnormalities of gait and mobility: Secondary | ICD-10-CM

## 2022-03-27 DIAGNOSIS — R29898 Other symptoms and signs involving the musculoskeletal system: Secondary | ICD-10-CM

## 2022-03-27 DIAGNOSIS — Z9889 Other specified postprocedural states: Secondary | ICD-10-CM

## 2022-03-28 ENCOUNTER — Encounter: Payer: Self-pay | Admitting: Physician Assistant

## 2022-03-28 ENCOUNTER — Other Ambulatory Visit: Payer: Self-pay | Admitting: Family Medicine

## 2022-03-28 ENCOUNTER — Ambulatory Visit (INDEPENDENT_AMBULATORY_CARE_PROVIDER_SITE_OTHER): Payer: Medicare Other | Admitting: Physician Assistant

## 2022-03-28 VITALS — BP 116/67 | HR 64 | Wt 189.0 lb

## 2022-03-28 DIAGNOSIS — N289 Disorder of kidney and ureter, unspecified: Secondary | ICD-10-CM | POA: Insufficient documentation

## 2022-03-28 DIAGNOSIS — I951 Orthostatic hypotension: Secondary | ICD-10-CM

## 2022-03-28 DIAGNOSIS — E785 Hyperlipidemia, unspecified: Secondary | ICD-10-CM

## 2022-03-28 MED ORDER — LISINOPRIL 10 MG PO TABS: 10.0000 mg | ORAL_TABLET | Freq: Every day | ORAL | 1 refills | Status: DC

## 2022-03-29 ENCOUNTER — Ambulatory Visit: Payer: Medicare Other | Admitting: Physical Therapy

## 2022-03-29 ENCOUNTER — Other Ambulatory Visit: Payer: Self-pay | Admitting: Neurology

## 2022-03-29 DIAGNOSIS — M25551 Pain in right hip: Secondary | ICD-10-CM | POA: Diagnosis not present

## 2022-03-29 DIAGNOSIS — R269 Unspecified abnormalities of gait and mobility: Secondary | ICD-10-CM

## 2022-03-29 DIAGNOSIS — R748 Abnormal levels of other serum enzymes: Secondary | ICD-10-CM

## 2022-03-29 DIAGNOSIS — R29898 Other symptoms and signs involving the musculoskeletal system: Secondary | ICD-10-CM

## 2022-03-29 DIAGNOSIS — N289 Disorder of kidney and ureter, unspecified: Secondary | ICD-10-CM

## 2022-03-29 DIAGNOSIS — M6281 Muscle weakness (generalized): Secondary | ICD-10-CM

## 2022-03-29 DIAGNOSIS — I951 Orthostatic hypotension: Secondary | ICD-10-CM

## 2022-03-29 LAB — BASIC METABOLIC PANEL WITH GFR
BUN/Creatinine Ratio: 18 (calc) (ref 6–22)
BUN: 26 mg/dL — ABNORMAL HIGH (ref 7–25)
CO2: 27 mmol/L (ref 20–32)
Calcium: 9.3 mg/dL (ref 8.6–10.3)
Chloride: 104 mmol/L (ref 98–110)
Creat: 1.42 mg/dL — ABNORMAL HIGH (ref 0.70–1.35)
Glucose, Bld: 102 mg/dL — ABNORMAL HIGH (ref 65–99)
Potassium: 4.6 mmol/L (ref 3.5–5.3)
Sodium: 139 mmol/L (ref 135–146)
eGFR: 55 mL/min/{1.73_m2} — ABNORMAL LOW (ref >=60)

## 2022-03-29 NOTE — Therapy (Signed)
[place], [place], [address] Phone: [phone]   Fax:  [phone]  Physical Therapy Treatment and Discharge  Patient Details  Name: Raymond Mitchell MRN: [id number] Date of Birth: [birth year] Referring Provider (PT): Dr Edmonia Lynch  Rationale for Evaluation and Treatment Rehabilitation  PHYSICAL THERAPY DISCHARGE SUMMARY  Visits from Start of Care: 12  Current functional level related to goals / functional outcomes: See below   Remaining deficits: See below   Education / Equipment: See below   Patient agrees to discharge. Patient goals were met. Patient is being discharged due to meeting the stated rehab goals.  Encounter Date: 03/29/2022   PT End of Session - 03/29/22 1343     Visit Number 12    Number of Visits 12    Date for PT Re-Evaluation 03/30/22    Authorization Type Medicare    Authorization - Visit Number 12    Progress Note Due on Visit 10    PT Start Time 6606    PT Stop Time 3016    PT Time Calculation (min) 32 min    Activity Tolerance Patient tolerated treatment well    Behavior During Therapy [facility] for tasks assessed/performed             Past Medical History:  Diagnosis Date   Ankylosing spondylitis ([place])    BPH (benign prostatic hyperplasia)    hx of   Diverticulosis    GERD (gastroesophageal reflux disease)    History of hiatal hernia    Hypertension    Lymphocytic colitis 01/15/2018   Pes planus    Right inguinal hernia    Tobacco user    Varicose veins     Past Surgical History:  Procedure Laterality Date   BACK SURGERY  12/2007   L 4 to L 5 fusion   BACK SURGERY  2012   disc repair   c spine ESI     [name]   INGUINAL HERNIA REPAIR  1994   INGUINAL HERNIA REPAIR Right 07/16/2020   Procedure: LAPAROSCOPIC RIGHT INGUINAL HERNIA WITH MESH;  Surgeon: [name], [name], MD;  Location: [place];  Service: General;   Laterality: Right;   RTC surgery Left 2005   TOTAL HIP ARTHROPLASTY Right 02/14/2022   Procedure: TOTAL HIP ARTHROPLASTY ANTERIOR APPROACH;  Surgeon: Renette Butters, MD;  [facility];  Service: Orthopedics;  Laterality: Right;    There were no vitals filed for this visit.   Subjective Assessment - 03/29/22 1350     Subjective Pt reports he saw PCP for BP yesterday. His BP meds got cut again. Pt followed up with Dr. Percell Miller who is watching his incision. Pt states he's been to the doctor, been to work, and done his walking already today.    Pertinent History Lt RCR 2022; LBP; HTN    Patient Stated Goals return to normal walking and all functional activities    Currently in Pain? No/denies    Pain Onset More than a month ago                [facility] PT Assessment - 03/29/22 0001       Assessment   Medical Diagnosis Rt THA anterior approach    Referring Provider (PT) Dr Edmonia Lynch    Onset Date/Surgical Date 02/14/22    Hand Dominance Right      Observation/Other Assessments   Focus on Therapeutic Outcomes (FOTO)  99  Strength   Strength Assessment Site Knee    Right Hip Flexion 5/5    Right Hip Extension 5/5    Right Hip ABduction 4+/5    Left Hip Flexion 5/5    Left Hip Extension 5/5    Left Hip ABduction 4+/5    Right/Left Knee --   Grossly 5/5                          [facility] Adult PT Treatment/Exercise - 03/29/22 0001       Knee/Hip Exercises: Standing   Heel Raises Right;2 sets;10 reps;Left    Heel Raises Limitations single leg    Forward Lunges Right;Left;2 sets;10 reps    SLS Forward T x10 L & R      Knee/Hip Exercises: Seated   Sit to Sand 20 reps;without UE support   staggered stance                         PT Long Term Goals - 03/29/22 1406       PT LONG TERM GOAL #1   Title Increase Rt hip ROM to WFL's and pain free in all planes    Time 6    Period Weeks    Status Achieved    Target Date 03/30/22       PT LONG TERM GOAL #2   Title 5/5 strength Rt LE    Time 6    Period Weeks    Status Partially Met    Target Date 03/30/22      PT LONG TERM GOAL #3   Title Independent in ambulation for community distances with least restrictive to no assistive device    Time 6    Period Weeks    Status Achieved    Target Date 03/30/22      PT LONG TERM GOAL #4   Title Independent in HEP(including aquatic program as indicated)    Time 6    Period Weeks    Status Achieved    Target Date 03/30/22      PT LONG TERM GOAL #5   Title Improve functional limitation score to 58    Time 6    Period Weeks    Status Achieved    Target Date 03/30/22                   Plan - 03/29/22 1418     Clinical Impression Statement Pt is back to normal activity. He has met all of his PT goals. He is ready for d/c. Session focused on reviewing exercises and discussing how pt is to progress and advance them at home. Continued difficulty with single leg stability. Has been cleared to return to golfing.    Stability/Clinical Decision Making Stable/Uncomplicated    Rehab Potential Good    PT Frequency 2x / week    PT Duration 6 weeks    PT Treatment/Interventions ADLs/Self Care Home Management;Aquatic Therapy;Cryotherapy;Electrical Stimulation;Iontophoresis 37m/ml Dexamethasone;Moist Heat;Ultrasound;Gait training;Stair training;Functional mobility training;Therapeutic activities;Therapeutic exercise;Balance training;Neuromuscular re-education;Patient/family education;Manual techniques;Passive range of motion;Scar mobilization;Dry needling;Taping    PT Next Visit Plan review and progress exercise program; gait training; ROM; strengthening as tolerated    PT Home Exercise Plan [id number]    Consulted and Agree with Plan of Care Patient             Patient will benefit from skilled therapeutic intervention in order to improve the following deficits and impairments:  Abnormal gait, Decreased activity  tolerance, Pain, Decreased balance, Impaired flexibility, Decreased mobility, Decreased strength, Increased edema, Postural dysfunction  Visit Diagnosis: Pain of right hip  Abnormal gait  Other symptoms and signs involving the musculoskeletal system  Muscle weakness (generalized)     Problem List Patient Active Problem List   Diagnosis Date Noted   Function kidney decreased 03/28/2022   Orthostatic hypotension 03/17/2022   S/P total right hip arthroplasty 02/14/2022   Primary osteoarthritis of right knee 09/16/2021   Primary osteoarthritis of right hip 09/16/2021   Rotator cuff tear, left 01/18/2021   Snoring 09/27/2020   Venous stasis 06/02/2020   IFG (impaired fasting glucose) 05/28/2019   Hiatal hernia 05/28/2019   BPPV (benign paroxysmal positional vertigo), left 11/26/2018   Allergic rhinitis due to pollen 11/26/2018   Lymphocytic colitis 01/15/2018   Right lumbar radiculopathy 09/12/2016   Hepatic cyst 11/11/2014   Cancer of skin, squamous cell 03/11/2014   Seborrheic dermatitis 09/05/2012   SCC (squamous cell carcinoma), leg 08/29/2011   Left cervical radiculopathy 01/02/2011   POSTURAL LIGHTHEADEDNESS 12/02/2010   CIGARETTE SMOKER 11/29/2010   Fatty liver 10/08/2009   CONSTIPATION, DRUG INDUCED 10/05/2009   DIVERTICULOSIS OF COLON 08/13/2009   [place] DISEASE, LUMBAR 07/08/2007   [place] DISEASE, CERVICAL 03/11/2007   BPH (benign prostatic hyperplasia) 02/13/2007   Hyperlipidemia 01/16/2007   ANKYLOSING SPONDYLITIS 12/05/2006   Essential hypertension, benign 12/05/2006    [facility] April Gordy Levan, PT, DPT 03/29/2022, 2:24 PM  [facility] [place] [address] [place], [place], [address] Phone: [phone]   Fax:  [phone]  Name: Raymond Mitchell MRN: [id number] Date of Birth: 11-03-1955

## 2022-03-29 NOTE — Progress Notes (Signed)
GFR just a little bit better but still kidneys are not working like they were 3 months ago.  Please order renal ultrasound. Add CBC and see if we can get a urine sodium and microalbumin.  Hold celebrex for now and drink plenty of fluids.

## 2022-03-30 LAB — CBC WITH DIFFERENTIAL/PLATELET
Absolute Monocytes: 861 cells/uL (ref 200–950)
Basophils Absolute: 78 cells/uL (ref 0–200)
Basophils Relative: 0.9 %
Eosinophils Absolute: 392 cells/uL (ref 15–500)
Eosinophils Relative: 4.5 %
HCT: 37.6 % — ABNORMAL LOW (ref 38.5–50.0)
Hemoglobin: 12.8 g/dL — ABNORMAL LOW (ref 13.2–17.1)
Lymphs Abs: 2366 cells/uL (ref 850–3900)
MCH: 34 pg — ABNORMAL HIGH (ref 27.0–33.0)
MCHC: 34 g/dL (ref 32.0–36.0)
MCV: 100 fL (ref 80.0–100.0)
MPV: 9.3 fL (ref 7.5–12.5)
Monocytes Relative: 9.9 %
Neutro Abs: 5003 cells/uL (ref 1500–7800)
Neutrophils Relative %: 57.5 %
Platelets: 247 10*3/uL (ref 140–400)
RBC: 3.76 10*6/uL — ABNORMAL LOW (ref 4.20–5.80)
RDW: 12.8 % (ref 11.0–15.0)
Total Lymphocyte: 27.2 %
WBC: 8.7 10*3/uL (ref 3.8–10.8)

## 2022-03-30 LAB — OSMOLALITY, URINE: Osmolality, Ur: 305 mOsm/kg (ref 50–1200)

## 2022-03-30 LAB — MICROALBUMIN / CREATININE URINE RATIO
Creatinine, Urine: 57 mg/dL (ref 20–320)
Microalb, Ur: <0.2 mg/dL

## 2022-03-30 LAB — SODIUM, URINE, RANDOM: Sodium, Ur: 31 mmol/L (ref 28–272)

## 2022-03-31 NOTE — Progress Notes (Signed)
Sodium in urine is good.  No abnormal protein spilling.  Hemoglobin is stable.

## 2022-04-11 ENCOUNTER — Ambulatory Visit (INDEPENDENT_AMBULATORY_CARE_PROVIDER_SITE_OTHER): Payer: Medicare Other

## 2022-04-11 ENCOUNTER — Encounter: Payer: Self-pay | Admitting: Physician Assistant

## 2022-04-11 ENCOUNTER — Other Ambulatory Visit: Payer: Self-pay | Admitting: Physician Assistant

## 2022-04-11 DIAGNOSIS — Q613 Polycystic kidney, unspecified: Secondary | ICD-10-CM | POA: Insufficient documentation

## 2022-04-11 DIAGNOSIS — R748 Abnormal levels of other serum enzymes: Secondary | ICD-10-CM | POA: Diagnosis not present

## 2022-04-11 DIAGNOSIS — N1831 Chronic kidney disease, stage 3a: Secondary | ICD-10-CM | POA: Insufficient documentation

## 2022-04-11 DIAGNOSIS — N289 Disorder of kidney and ureter, unspecified: Secondary | ICD-10-CM | POA: Diagnosis not present

## 2022-04-11 DIAGNOSIS — I951 Orthostatic hypotension: Secondary | ICD-10-CM

## 2022-04-11 NOTE — Progress Notes (Signed)
Raymond Mitchell,   U/S shows liver benign liver cyst and both kidneys with numerous renal cyst. This looks like polycystic kidney disease. I am sending you to nephrology for consult.   Can see enlarged prostate. No concerns.

## 2022-04-15 ENCOUNTER — Other Ambulatory Visit: Payer: Self-pay | Admitting: Sports Medicine

## 2022-04-15 DIAGNOSIS — S46012A Strain of muscle(s) and tendon(s) of the rotator cuff of left shoulder, initial encounter: Secondary | ICD-10-CM

## 2022-04-20 ENCOUNTER — Other Ambulatory Visit: Payer: Self-pay | Admitting: Physician Assistant

## 2022-04-20 DIAGNOSIS — I951 Orthostatic hypotension: Secondary | ICD-10-CM

## 2022-04-21 ENCOUNTER — Telehealth: Payer: Self-pay | Admitting: Neurology

## 2022-04-21 NOTE — Telephone Encounter (Signed)
Patient called and LVM asking for refill on Lisinopril 10 mg - states working well.   Also wants to discuss US renal again, has some questions.   [phone].

## 2022-04-21 NOTE — Telephone Encounter (Signed)
Patient made aware RX has refill at the pharmacy.   Went over [place] results again. Gave number to contact [facility] about referral.

## 2022-04-27 ENCOUNTER — Telehealth: Payer: Self-pay | Admitting: Neurology

## 2022-04-27 NOTE — Telephone Encounter (Signed)
Sent referral to [facility] in [facility] - cF

## 2022-04-27 NOTE — Telephone Encounter (Signed)
Patient left vm asking for his kidney specialist referral to be sent to a provider in [place].   He states [facility] that it was sent to only has offices in [place] and he can not fit traveling that far into his schedule.   Please send new referral and follow up with Ahliya Glatt.

## 2022-05-20 ENCOUNTER — Other Ambulatory Visit: Payer: Self-pay | Admitting: Physician Assistant

## 2022-05-20 DIAGNOSIS — I951 Orthostatic hypotension: Secondary | ICD-10-CM

## 2022-05-27 ENCOUNTER — Other Ambulatory Visit: Payer: Self-pay | Admitting: Family Medicine

## 2022-05-28 ENCOUNTER — Other Ambulatory Visit: Payer: Self-pay | Admitting: Family Medicine

## 2022-05-29 ENCOUNTER — Ambulatory Visit (INDEPENDENT_AMBULATORY_CARE_PROVIDER_SITE_OTHER): Payer: Medicare Other | Admitting: Family Medicine

## 2022-05-29 ENCOUNTER — Encounter: Payer: Self-pay | Admitting: Family Medicine

## 2022-05-29 VITALS — BP 127/71 | HR 80 | Ht 73.0 in | Wt 183.0 lb

## 2022-05-29 DIAGNOSIS — R7301 Impaired fasting glucose: Secondary | ICD-10-CM | POA: Diagnosis not present

## 2022-05-29 DIAGNOSIS — K529 Noninfective gastroenteritis and colitis, unspecified: Secondary | ICD-10-CM

## 2022-05-29 DIAGNOSIS — I10 Essential (primary) hypertension: Secondary | ICD-10-CM | POA: Diagnosis not present

## 2022-05-29 DIAGNOSIS — N1831 Chronic kidney disease, stage 3a: Secondary | ICD-10-CM

## 2022-05-29 DIAGNOSIS — Q612 Polycystic kidney, adult type: Secondary | ICD-10-CM | POA: Insufficient documentation

## 2022-05-29 DIAGNOSIS — M5416 Radiculopathy, lumbar region: Secondary | ICD-10-CM

## 2022-05-29 LAB — POCT GLYCOSYLATED HEMOGLOBIN (HGB A1C): Hemoglobin A1C: 5.5 % (ref 4.0–5.6)

## 2022-05-29 MED ORDER — GABAPENTIN 300 MG PO CAPS: 600.0000 mg | ORAL_CAPSULE | Freq: Every day | ORAL | 1 refills | Status: DC

## 2022-05-29 NOTE — Progress Notes (Signed)
poctFollow up

## 2022-05-29 NOTE — Assessment & Plan Note (Signed)
A1c looks phenomenal today at 5.5.  Continue current regimen.

## 2022-05-29 NOTE — Assessment & Plan Note (Signed)
Pressure looks phenomenal on metoprolol 50 mg daily.

## 2022-05-29 NOTE — Progress Notes (Signed)
Established Patient Office Visit  Subjective   Patient ID: Raymond Mitchell, male    DOB: 22-Aug-1956  Age: 66 y.o. MRN: [id number]  Chief Complaint  Patient presents with   Follow-up    HPI  Hypertension- Pt denies chest pain, SOB, dizziness, or heart palpitations.  Taking meds as directed w/o problems.  Denies medication side effects.  Pressures were running on the low end after hip replacement surgery so he is now taking 50 mg of metoprolol.  Impaired fasting glucose-no increased thirst or urination. No symptoms consistent with hypoglycemia.  Now following with [facility].  Trelegy did stop his calcium they have him taking his zinc every other day.  Status post hip replacement back in June-he is doing well and off of the medications including muscle relaxers and pain medications etc.  He says it took the incision a while to heal but it is well-healed now and he has been exercising and working out for the last 6 weeks without significant problems or difficulty.  He thinks he and his wife may have had something bad to eat on Friday night because he had diarrhea nausea all day and so did his wife.  He did try to hydrate well he never vomited.  He says the diarrhea is better but he still little nauseated.    ROS    Objective:     BP 127/71   Pulse 80   Ht '6\' 1"'$  (1.854 m)   Wt 183 lb (83 kg)   SpO2 99%   BMI 24.14 kg/m    Physical Exam Constitutional:      Appearance: He is well-developed.  HENT:     Head: Normocephalic and atraumatic.  Cardiovascular:     Rate and Rhythm: Normal rate and regular rhythm.     Heart sounds: Normal heart sounds.  Pulmonary:     Effort: Pulmonary effort is normal.     Breath sounds: Normal breath sounds.  Skin:    General: Skin is warm and dry.  Neurological:     Mental Status: He is alert and oriented to person, place, and time.  Psychiatric:        Behavior: Behavior normal.      Results  for orders placed or performed in visit on 05/29/22  POCT HgB A1C  Result Value Ref Range   Hemoglobin A1C     HbA1c POC (<> result, manual entry)     HbA1c, POC (prediabetic range)     HbA1c, POC (controlled diabetic range)        The 10-year ASCVD risk score (Arnett DK, et al., 2019) is: 17.9%    Assessment & Plan:   Problem List Items Addressed This Visit       Cardiovascular and Mediastinum   Essential hypertension, benign    Pressure looks phenomenal on metoprolol 50 mg daily.      Relevant Medications   metoprolol succinate (TOPROL-XL) 50 MG 24 hr tablet     Endocrine   IFG (impaired fasting glucose) - Primary    A1c looks phenomenal today at 5.5.  Continue current regimen.      Relevant Orders   POCT HgB A1C (Completed)     Nervous and Auditory   Right lumbar radiculopathy    Really only uses gabapentin at bedtime and usually takes 2 capsules so we updated the medication list.      Relevant Medications   gabapentin (NEURONTIN) 300 MG capsule  Genitourinary   Stage 3a chronic kidney disease ([place])    Being followed by Dr. Claria Dice.  Has follow-up appointment in December.  Renal function did improve since his surgery.  She did discontinue some of his supplements that he had been taking so we tried to clean up his medication list today.      Other Visit Diagnoses     Enteritis          Neuritis-it sounds like it is improving on its own so were not going to do any additional work-up at this time.  If it continues to progress or worsen or just does not resolve completely then please let me know.  No follow-ups on file.    Beatrice Lecher, MD

## 2022-05-29 NOTE — Assessment & Plan Note (Addendum)
Being followed by Dr. Claria Dice.  Has follow-up appointment in December.  Renal function did improve since his surgery.  She did discontinue some of his supplements that he had been taking so we tried to clean up his medication list today.

## 2022-05-29 NOTE — Assessment & Plan Note (Signed)
Really only uses gabapentin at bedtime and usually takes 2 capsules so we updated the medication list.

## 2022-06-18 ENCOUNTER — Other Ambulatory Visit: Payer: Self-pay | Admitting: Family Medicine

## 2022-06-18 DIAGNOSIS — I951 Orthostatic hypotension: Secondary | ICD-10-CM

## 2022-06-23 ENCOUNTER — Ambulatory Visit (INDEPENDENT_AMBULATORY_CARE_PROVIDER_SITE_OTHER): Payer: Medicare Other

## 2022-06-23 ENCOUNTER — Ambulatory Visit (INDEPENDENT_AMBULATORY_CARE_PROVIDER_SITE_OTHER): Payer: Medicare Other | Admitting: Sports Medicine

## 2022-06-23 DIAGNOSIS — M5412 Radiculopathy, cervical region: Secondary | ICD-10-CM | POA: Diagnosis not present

## 2022-06-23 DIAGNOSIS — M542 Cervicalgia: Secondary | ICD-10-CM

## 2022-06-23 MED ORDER — METHYLPREDNISOLONE SODIUM SUCC 125 MG IJ SOLR
125.0000 mg | Freq: Once | INTRAMUSCULAR | Status: AC
  Administered 2022-06-23: 125 mg via INTRAMUSCULAR

## 2022-06-23 MED ORDER — CELECOXIB 100 MG PO CAPS: ORAL_CAPSULE | ORAL | 3 refills | Status: DC

## 2022-06-23 MED ORDER — PREDNISONE 50 MG PO TABS: ORAL_TABLET | ORAL | 0 refills | Status: DC

## 2022-06-23 MED ORDER — KETOROLAC TROMETHAMINE 30 MG/ML IJ SOLN
30.0000 mg | Freq: Once | INTRAMUSCULAR | Status: AC
  Administered 2022-06-23: 30 mg via INTRAMUSCULAR

## 2022-06-23 NOTE — Assessment & Plan Note (Signed)
Pleasant 66 year old male, he has known cervical spondylosis, he has had some cervical epidurals with Dr. Brien Few several decades ago that worked well. I saw him in 2017, he had a recurrence of symptoms and we treated him conservatively with Solu-Medrol 125 Toradol 30 intramuscular, prednisone, Celebrex, he did really well and did not return to see me. He is having a recurrence, will do the same. Adding x-rays, home conditioning, will do the Solu-Medrol, Toradol, prednisone, Celebrex, return to see me in about 6 weeks, MRI for interventional planning if not better.

## 2022-06-23 NOTE — Addendum Note (Signed)
Addended by: Dema Severin on: 06/23/2022 11:47 AM   Modules accepted: Orders

## 2022-06-23 NOTE — Progress Notes (Signed)
    Procedures performed today:    None.  Independent interpretation of notes and tests performed by another provider:   None.  Brief History, Exam, Impression, and Recommendations:    Left cervical radiculopathy Pleasant 66 year old male, he has known cervical spondylosis, he has had some cervical epidurals with Dr. Brien Few several decades ago that worked well. I saw him in 2017, he had a recurrence of symptoms and we treated him conservatively with Solu-Medrol 125 Toradol 30 intramuscular, prednisone, Celebrex, he did really well and did not return to see me. He is having a recurrence, will do the same. Adding x-rays, home conditioning, will do the Solu-Medrol, Toradol, prednisone, Celebrex, return to see me in about 6 weeks, MRI for interventional planning if not better.  Chronic process with exacerbation and pharmacologic intervention  ____________________________________________ Gwen Her. Dianah Field, M.D., ABFM., CAQSM., AME. Primary Care and Sports Medicine  [facility]  Adjunct Professor of [place] [employer] [employer]  Risk manager

## 2022-06-26 ENCOUNTER — Ambulatory Visit (INDEPENDENT_AMBULATORY_CARE_PROVIDER_SITE_OTHER): Payer: Medicare Other | Admitting: Family Medicine

## 2022-06-26 ENCOUNTER — Encounter: Payer: Self-pay | Admitting: Family Medicine

## 2022-06-26 VITALS — BP 128/75 | HR 76 | Ht 73.0 in | Wt 189.0 lb

## 2022-06-26 DIAGNOSIS — Z23 Encounter for immunization: Secondary | ICD-10-CM | POA: Diagnosis not present

## 2022-06-26 DIAGNOSIS — Z0000 Encounter for general adult medical examination without abnormal findings: Secondary | ICD-10-CM

## 2022-06-26 NOTE — Patient Instructions (Addendum)
Please go to pharmacy to get 2nd Shingrix vaccine done.  Raymond Mitchell , Thank you for taking time to come for your Medicare Wellness Visit. I appreciate your ongoing commitment to your health goals. Please review the following plan we discussed and let me know if I can assist you in the future.   These are the goals we discussed:  Goals      Exercise 150 min/wk Moderate Activity     Keep up the walking and resistance training.         This is a list of the screening recommended for you and due dates:  Health Maintenance  Topic Date Due   HIV Screening  Never done   Zoster (Shingles) Vaccine (2 of 2) 01/25/2022   COVID-19 Vaccine (4 - Pfizer risk series) 07/12/2022*   Colon Cancer Screening  11/30/2024   Tetanus Vaccine  12/01/2030   Pneumonia Vaccine  Completed   Flu Shot  Completed   Hepatitis C Screening: USPSTF Recommendation to screen - Ages 18-79 yo.  Completed   HPV Vaccine  Aged Out  *Topic was postponed. The date shown is not the original due date.

## 2022-06-26 NOTE — Progress Notes (Signed)
Subjective:   Raymond Mitchell is a 66 y.o. male who presents for a Welcome to Medicare exam.  He still works full-time.  He did let me know that starting Friday night he had a little bit of drainage and mild sore throat the next day it felt more like a tickle in the back of his throat and he started getting some nasal congestion he has been taking pseudoephedrine and that is been helping a little bit he is also been using a nasal spray.  He said he felt a little bit better yesterday still had some congestion.  Never ran a fever.  His wife was sick 2 weeks prior.  Review of Systems: ROS is negative Cardiac Risk Factors include: hypertension;advanced age (>3mn, >>70women)     Objective:    Today's Vitals   06/26/22 0822  BP: 128/75  Pulse: 76  SpO2: 95%  Weight: 189 lb (85.7 kg)  Height: '6\' 1"'$  (1.854 m)   Body mass index is 24.94 kg/m.  Medications Outpatient Encounter Medications as of 06/26/2022  Medication Sig   Ascorbic Acid (VITAMIN C WITH ROSE HIPS) 500 MG tablet Take 500 mg by mouth daily.   aspirin EC 81 MG tablet Take 1 tablet (81 mg total) by mouth 2 (two) times daily. For DVT prophylaxis for 30 days after surgery.   atorvastatin (LIPITOR) 40 MG tablet TAKE 1 TABLET BY MOUTH EVERY DAY   [employer] SENNA PLUS 8.6-50 MG tablet TAKE 2 TABLETS BY MOUTH AT BEDTIME   fish oil-omega-3 fatty acids 1000 MG capsule Take 1 g by mouth daily.   gabapentin (NEURONTIN) 300 MG capsule Take 2 capsules (600 mg total) by mouth at bedtime. 300 mg in the morning, 600 mg at bedtime   ipratropium (ATROVENT) 0.03 % nasal spray SPRAY 2 SPRAYS INTO EACH NOSTRIL EVERY MORNING   lisinopril (ZESTRIL) 10 MG tablet TAKE 1 TABLET BY MOUTH EVERY DAY   metoprolol succinate (TOPROL-XL) 50 MG 24 hr tablet Take 1 tablet by mouth daily.   multivitamin (THERAGRAN) per tablet Take 1 tablet by mouth daily.   Potassium 99 MG TABS Take 1-2 tablets by mouth See admin instructions. 2 tabs in the morning, 1 tab at  bedtime   predniSONE (DELTASONE) 50 MG tablet One tab PO daily for 5 days.   tamsulosin (FLOMAX) 0.4 MG CAPS capsule TAKE 1 CAPSULE BY MOUTH EVERYDAY AT BEDTIME   Thiamine HCl (VITAMIN B1) 100 MG TABS Take 100 mg by mouth daily.   Turmeric (QC TUMERIC COMPLEX PO) Take 1 capsule by mouth daily.   zinc gluconate 50 MG tablet Take 50 mg by mouth daily.   [DISCONTINUED] celecoxib (CELEBREX) 100 MG capsule One to 2 tablets by mouth daily as needed for pain.   No facility-administered encounter medications on file as of 06/26/2022.     History: Past Medical History:  Diagnosis Date   Ankylosing spondylitis (HCC)    BPH (benign prostatic hyperplasia)    hx of   Diverticulosis    GERD (gastroesophageal reflux disease)    History of hiatal hernia    Hypertension    Lymphocytic colitis 01/15/2018   Pes planus    Right inguinal hernia    Tobacco user    Varicose veins    Past Surgical History:  Procedure Laterality Date   BACK SURGERY  12/2007   L 4 to L 5 fusion   BACK SURGERY  2012   disc repair   c spine ESI  [name]   INGUINAL HERNIA REPAIR  1994   INGUINAL HERNIA REPAIR Right 07/16/2020   Procedure: LAPAROSCOPIC RIGHT INGUINAL HERNIA WITH MESH;  Surgeon: [name], [name], MD;  [facility]: [facility];  Service: General;  Laterality: Right;   RTC surgery Left 2005   TOTAL HIP ARTHROPLASTY Right 02/14/2022   Procedure: TOTAL HIP ARTHROPLASTY ANTERIOR APPROACH;  Surgeon: Renette Butters, MD;  Location: [facility];  Service: Orthopedics;  Laterality: Right;    Family History  Problem Relation Age of Onset   Hyperlipidemia Mother    Hypertension Mother    Diabetes Mother    Hyperlipidemia Father    Hypertension Father    Other Father        ank spondy   Other Brother        Brain tumor, CVA   Other Brother        ank spondy   Social History   Occupational History   Not on file  Tobacco Use   Smoking status: Every Day    Packs/day: 1.00    Years:  40.00    Total pack years: 40.00    Types: Cigarettes    Last attempt to quit: 12/28/2011    Years since quitting: 10.5   Smokeless tobacco: Never   Tobacco comments:    1 pack a day   Vaping Use   Vaping Use: Never used  Substance and Sexual Activity   Alcohol use: Yes    Alcohol/week: 21.0 standard drinks of alcohol    Types: 21 Standard drinks or equivalent per week    Comment: occ wine   Drug use: Never   Sexual activity: Not on file   Tobacco Counseling Ready to quit: Not Answered Counseling given: Not Answered Tobacco comments: 1 pack a day    Immunizations and Health Maintenance Immunization History  Administered Date(s) Administered   Fluad Quad(high Dose 65+) 06/26/2022   Influenza Split 08/22/2012   Influenza Whole 07/08/2007, 10/08/2009, 05/30/2011   Influenza,inj,Quad PF,6+ Mos 07/02/2013, 07/09/2014, 09/06/2015, 09/12/2016, 05/28/2019, 06/02/2020, 06/02/2021   Influenza-Unspecified 05/02/2018   PFIZER(Purple Top)SARS-COV-2 Vaccination 12/19/2019, 01/09/2020, 08/05/2020   PNEUMOCOCCAL CONJUGATE-20 11/30/2021   Pneumococcal Polysaccharide-23 09/04/2017   Tdap 05/30/2011, 11/30/2020   Zoster Recombinat (Shingrix) 11/30/2021   Health Maintenance Due  Topic Date Due   HIV Screening  Never done   Zoster Vaccines- Shingrix (2 of 2) 01/25/2022    Activities of Daily Living    06/26/2022    9:01 AM 06/26/2022    8:41 AM  In your present state of health, do you have any difficulty performing the following activities:  Hearing?  0  Comment soft voices   Vision?  0  Difficulty concentrating or making decisions?  1  Comment  occ forget names but says can usually remember after a few minutes  Walking or climbing stairs?  0  Dressing or bathing?  0  Doing errands, shopping?  0  Preparing Food and eating ?  N  Using the Toilet?  N  In the past six months, have you accidently leaked urine?  N  Do you have problems with loss of bowel control?  N  Managing your  Medications?  N  Managing your Finances?  N  Housekeeping or managing your Housekeeping?  N    Physical Exam  Physical Exam Constitutional:      Appearance: He is well-developed.  HENT:     Head: Normocephalic and atraumatic.     Right Ear: External ear  normal.     Left Ear: External ear normal.     Mouth/Throat:     Pharynx: Oropharynx is clear. No oropharyngeal exudate.  Eyes:     Conjunctiva/sclera: Conjunctivae normal.  Cardiovascular:     Rate and Rhythm: Normal rate and regular rhythm.     Heart sounds: Normal heart sounds.  Pulmonary:     Effort: Pulmonary effort is normal.     Breath sounds: Normal breath sounds.  Skin:    General: Skin is warm and dry.  Neurological:     General: No focal deficit present.     Mental Status: He is alert and oriented to person, place, and time.  Psychiatric:        Mood and Affect: Mood normal.        Behavior: Behavior normal.    (optional), or other factors deemed appropriate based on the beneficiary's medical and social history and current clinical standards.  Advanced Directives:      Assessment:    This is a routine wellness  examination for this patient .   Vision/Hearing screen No results found.  Dietary issues and exercise activities discussed:  Current Exercise Habits: Home exercise routine, Type of exercise: walking;strength training/weights, Frequency (Times/Week): 5, Intensity: Moderate   Goals      Exercise 150 min/wk Moderate Activity     Keep up the walking and resistance training.         Depression Screen    06/26/2022    9:02 AM 11/30/2021    8:23 AM 06/02/2021    8:11 AM 05/04/2020   11:36 AM  PHQ 2/9 Scores  PHQ - 2 Score 0 0 0 0     Fall Risk    06/26/2022    9:00 AM  Fall Risk   Falls in the past year? 1  Number falls in past yr: 1  Injury with Fall? 1  Risk for fall due to : Other (Comment)  Risk for fall due to: Comment tripped  Follow up Falls evaluation completed    Cognitive  Function        06/26/2022    9:03 AM  6CIT Screen  What Year? 0 points  What month? 0 points  What time? 0 points  Count back from 20 0 points  Months in reverse 0 points  Repeat phrase 0 points  Total Score 0 points    Patient Care Team: Hali Marry, MD as PCP - General (Family Medicine)     Plan:   Medicare Wellness   I have personally reviewed and noted the following in the patient's chart:   Medical and social history Use of alcohol, tobacco or illicit drugs  Current medications and supplements Functional ability and status Nutritional status Physical activity Advanced directives List of other physicians Hospitalizations, surgeries, and ER visits in previous 12 months Vitals Screenings to include cognitive, depression, and falls Referrals and appointments EKG today shows rate of 74 bpm, normal sinus rhythm no acute ST-T wave changes.  No significant changes from prior EKG on file from Feb 06, 2022.  In addition, I have reviewed and discussed with patient certain preventive protocols, quality metrics, and best practice recommendations. A written personalized care plan for preventive services as well as general preventive health recommendations were provided to patient.    Beatrice Lecher, MD 06/26/2022

## 2022-06-28 ENCOUNTER — Other Ambulatory Visit: Payer: Self-pay | Admitting: Family Medicine

## 2022-06-28 ENCOUNTER — Other Ambulatory Visit: Payer: Self-pay | Admitting: Sports Medicine

## 2022-06-28 DIAGNOSIS — M5412 Radiculopathy, cervical region: Secondary | ICD-10-CM

## 2022-07-09 ENCOUNTER — Other Ambulatory Visit: Payer: Self-pay | Admitting: Family Medicine

## 2022-07-09 DIAGNOSIS — I10 Essential (primary) hypertension: Secondary | ICD-10-CM

## 2022-07-14 ENCOUNTER — Telehealth: Payer: Self-pay | Admitting: Neurology

## 2022-07-14 NOTE — Telephone Encounter (Signed)
Can lets try to call the pharmacy and see if they would allow an early release.  I am okay with him filling it early.

## 2022-07-14 NOTE — Telephone Encounter (Signed)
Patient left vm wanting [place] to okay him getting his RX for Atrovent nasal spray early. Pharmacy states he can't fill til 08/23/2022. He states he had a cold and used it twice daily for awhile. Please advise?

## 2022-07-15 ENCOUNTER — Other Ambulatory Visit: Payer: Self-pay | Admitting: Family Medicine

## 2022-07-15 DIAGNOSIS — I951 Orthostatic hypotension: Secondary | ICD-10-CM

## 2022-07-17 NOTE — Telephone Encounter (Signed)
Called pharmacy, gave okay to refill early. They will contact the patient.

## 2022-08-18 IMAGING — RF DG HIP (WITH OR WITHOUT PELVIS) 1V*L*
1 series · 2 of 2 positions shown · non-contrast
Comparison: None Available.

CLINICAL DATA: Right hip replacement.

EXAM:
DG HIP (WITH OR WITHOUT PELVIS) 1V*L*

[Series 1: unknown protocol · 0.20mm/px · 2 of 2 slices shown]
[im 1/2]
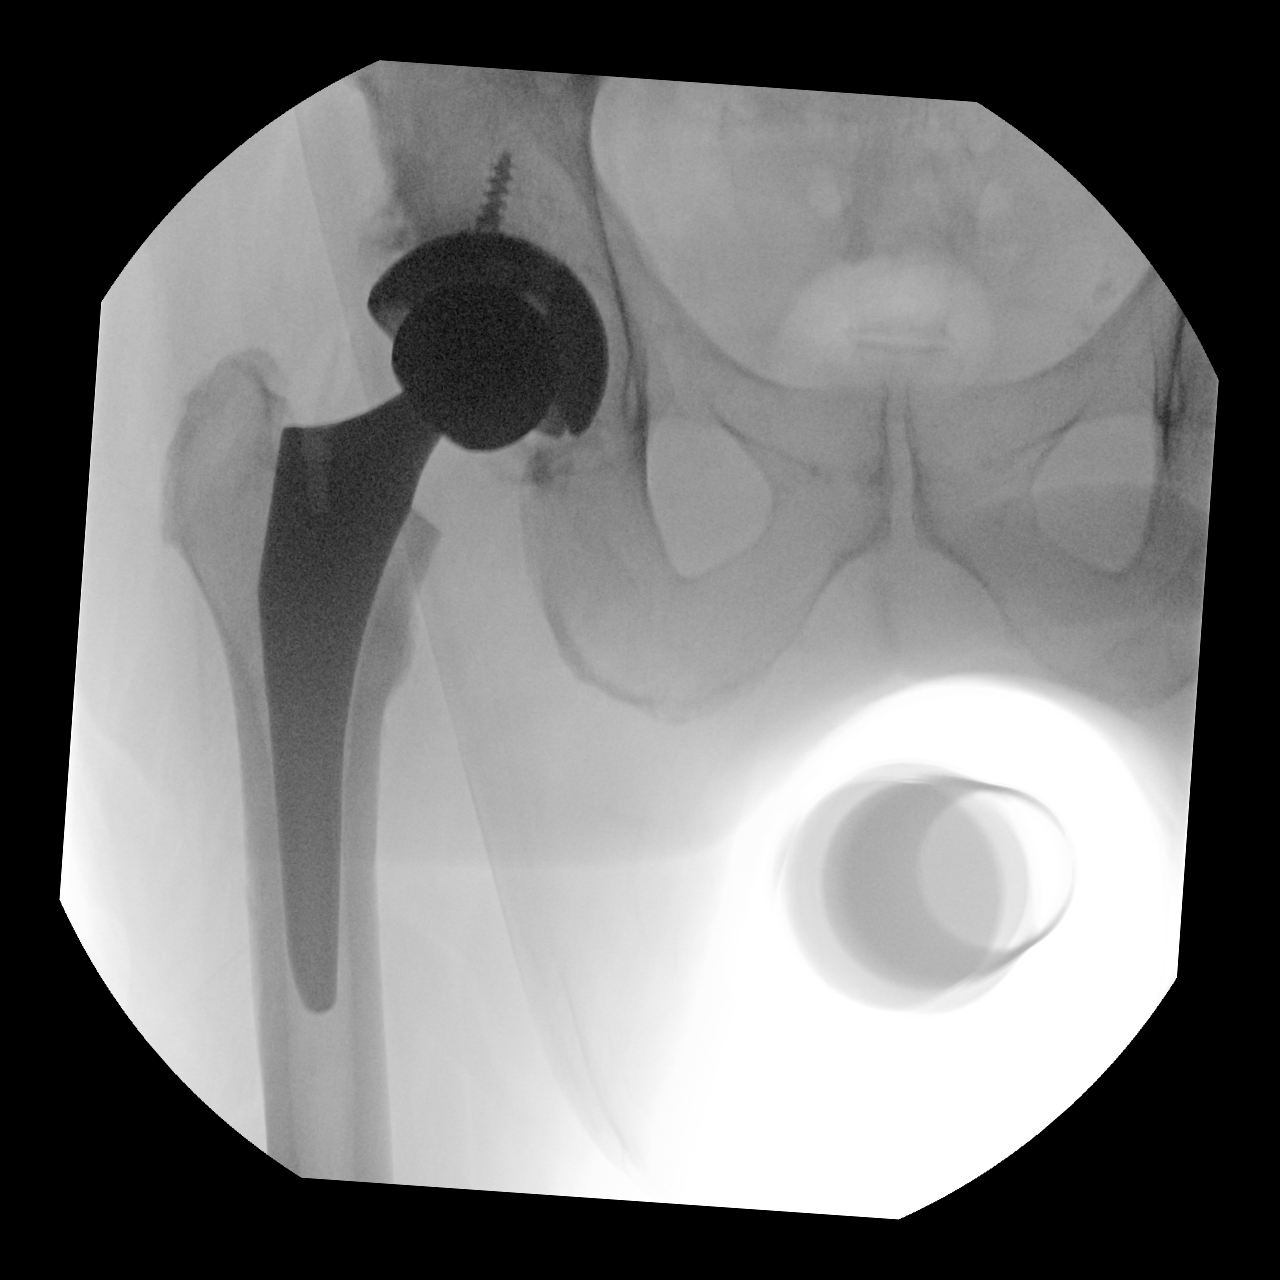
[im 2/2]
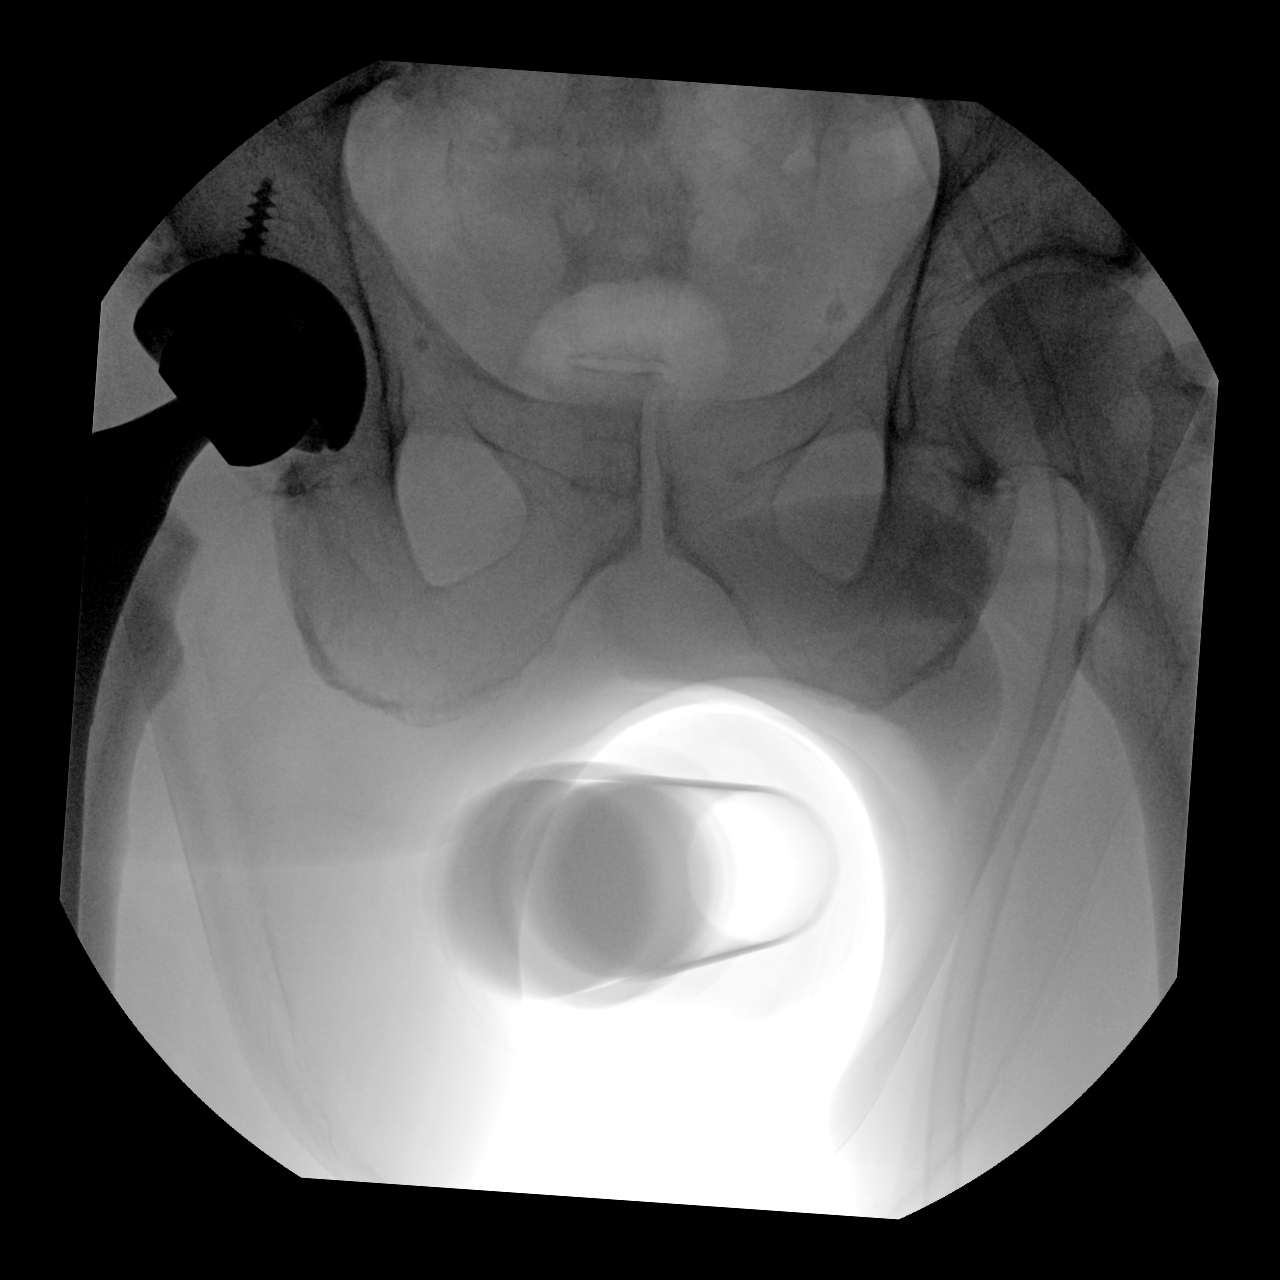

[2 of 2 positions shown; findings below may reference images not displayed]

FLUOROSCOPY TIME:  Radiation Exposure Index (as provided by the
fluoroscopic device): 1.95 mGy Kerma

C-arm fluoroscopic images were obtained intraoperatively and
submitted for post operative interpretation.
FINDINGS: Intraoperative fluoroscopic images demonstrate interval right total
hip arthroplasty. Components are well aligned. No acute osseous
abnormality.
IMPRESSION: 1. Intraoperative fluoroscopic guidance for right total hip
arthroplasty.

## 2022-08-29 ENCOUNTER — Ambulatory Visit (INDEPENDENT_AMBULATORY_CARE_PROVIDER_SITE_OTHER): Payer: Medicare Other | Admitting: Family Medicine

## 2022-08-29 ENCOUNTER — Encounter: Payer: Self-pay | Admitting: Family Medicine

## 2022-08-29 VITALS — BP 126/80 | HR 78 | Ht 73.0 in | Wt 192.0 lb

## 2022-08-29 DIAGNOSIS — I10 Essential (primary) hypertension: Secondary | ICD-10-CM | POA: Diagnosis not present

## 2022-08-29 DIAGNOSIS — F17200 Nicotine dependence, unspecified, uncomplicated: Secondary | ICD-10-CM

## 2022-08-29 DIAGNOSIS — Z23 Encounter for immunization: Secondary | ICD-10-CM

## 2022-08-29 DIAGNOSIS — M79672 Pain in left foot: Secondary | ICD-10-CM

## 2022-08-29 DIAGNOSIS — N1831 Chronic kidney disease, stage 3a: Secondary | ICD-10-CM

## 2022-08-29 DIAGNOSIS — R7301 Impaired fasting glucose: Secondary | ICD-10-CM | POA: Diagnosis not present

## 2022-08-29 DIAGNOSIS — M79671 Pain in right foot: Secondary | ICD-10-CM

## 2022-08-29 DIAGNOSIS — Z122 Encounter for screening for malignant neoplasm of respiratory organs: Secondary | ICD-10-CM | POA: Diagnosis not present

## 2022-08-29 LAB — POCT GLYCOSYLATED HEMOGLOBIN (HGB A1C): Hemoglobin A1C: 5.7 % — AB (ref 4.0–5.6)

## 2022-08-29 NOTE — Assessment & Plan Note (Signed)
A1c looks good overall though up just a little bit from previous.

## 2022-08-29 NOTE — Progress Notes (Signed)
Established Patient Office Visit  Subjective   Patient ID: Raymond Mitchell, male    DOB: [birth year]  Age: 66 y.o. MRN: [id number]  Chief Complaint  Patient presents with   Hypertension    HPI Hypertension- Pt denies chest pain, SOB, dizziness, or heart palpitations.  Taking meds as directed w/o problems.  Denies medication side effects.  Tracking blood pressures at home to yearly since decreasing the metoprolol.  Has been getting good numbers overall.  Impaired fasting glucose-no increased thirst or urination. No symptoms consistent with hypoglycemia.  Has been exercising 1 to 2 days/week.  He also walks about 10,000 steps a day at work.  He has been experiencing a little bit more pain in the bottoms of his feet is particularly over the ball part of the foot and the heel.  He did have a custom arch insert made about 6 years ago and he still wears them in his shoes.    ROS    Objective:     BP 126/80   Pulse 78   Ht '6\' 1"'$  (1.854 m)   Wt 192 lb (87.1 kg)   SpO2 96%   BMI 25.33 kg/m    Physical Exam Constitutional:      Appearance: He is well-developed.  HENT:     Head: Normocephalic and atraumatic.  Cardiovascular:     Rate and Rhythm: Normal rate and regular rhythm.     Heart sounds: Normal heart sounds.  Pulmonary:     Effort: Pulmonary effort is normal.     Breath sounds: Normal breath sounds.  Skin:    General: Skin is warm and dry.  Neurological:     Mental Status: He is alert and oriented to person, place, and time.  Psychiatric:        Behavior: Behavior normal.     Results for orders placed or performed in visit on 08/29/22  POCT glycosylated hemoglobin (Hb A1C)  Result Value Ref Range   Hemoglobin A1C 5.7 (A) 4.0 - 5.6 %   HbA1c POC (<> result, manual entry)     HbA1c, POC (prediabetic range)     HbA1c, POC (controlled diabetic range)        The 10-year ASCVD risk score (Arnett DK, et al., 2019) is: 17.6%    Assessment & Plan:    Problem List Items Addressed This Visit       Cardiovascular and Mediastinum   Essential hypertension, benign    Well controlled. Continue current regimen. Follow up in  6 mo. Also, take a list of his blood pressures to his nephrologist next month.  Likely do blood work then some get a hold off on rechecking labs today.        Endocrine   IFG (impaired fasting glucose)    A1c looks good overall though up just a little bit from previous.      Relevant Orders   POCT glycosylated hemoglobin (Hb A1C) (Completed)     Genitourinary   Stage 3a chronic kidney disease ([place])    Has follow-up with nephrology next month they will likely do updated labs so we will hold off on doing blood work today.        Other   CIGARETTE SMOKER   Other Visit Diagnoses     Encounter for screening for lung cancer    -  Primary   Relevant Orders   Ambulatory Referral Lung Cancer Screening [facility]   Encounter for immunization  Relevant Orders   Pfizer Fall 2023 Covid-19 Vaccine 69yr and older (Completed)   Bilateral foot pain           Bilateral foot pain-encouraged him to consider getting back in with podiatry who made his original inserts and maybe have them updated or a new pair.  I suspect they are probably worn out if he has been using them for about 6 years.  COVID-vaccine given today.  Encouraged him to schedule his second shingles vaccine at the pharmacy as he is due.    Return in about 6 months (around 02/27/2023) for Hypertension.    CBeatrice Lecher MD

## 2022-08-29 NOTE — Assessment & Plan Note (Signed)
Has follow-up with nephrology next month they will likely do updated labs so we will hold off on doing blood work today.

## 2022-08-29 NOTE — Assessment & Plan Note (Signed)
Well controlled. Continue current regimen. Follow up in  6 mo. Also, take a list of his blood pressures to his nephrologist next month.  Likely do blood work then some get a hold off on rechecking labs today.

## 2022-09-06 ENCOUNTER — Other Ambulatory Visit: Payer: Self-pay | Admitting: Family Medicine

## 2022-09-15 ENCOUNTER — Other Ambulatory Visit: Payer: Self-pay | Admitting: Sports Medicine

## 2022-09-15 DIAGNOSIS — M5412 Radiculopathy, cervical region: Secondary | ICD-10-CM

## 2022-09-24 ENCOUNTER — Other Ambulatory Visit: Payer: Self-pay | Admitting: Family Medicine

## 2022-09-24 DIAGNOSIS — E785 Hyperlipidemia, unspecified: Secondary | ICD-10-CM

## 2022-10-01 ENCOUNTER — Other Ambulatory Visit: Payer: Self-pay | Admitting: Family Medicine

## 2022-12-04 ENCOUNTER — Other Ambulatory Visit: Payer: Self-pay | Admitting: Family Medicine

## 2022-12-25 ENCOUNTER — Other Ambulatory Visit: Payer: Self-pay | Admitting: Family Medicine

## 2022-12-25 DIAGNOSIS — M5416 Radiculopathy, lumbar region: Secondary | ICD-10-CM

## 2023-01-14 ENCOUNTER — Other Ambulatory Visit: Payer: Self-pay | Admitting: Family Medicine

## 2023-01-14 DIAGNOSIS — I951 Orthostatic hypotension: Secondary | ICD-10-CM

## 2023-02-27 ENCOUNTER — Ambulatory Visit (INDEPENDENT_AMBULATORY_CARE_PROVIDER_SITE_OTHER): Payer: Medicare Other | Admitting: Family Medicine

## 2023-02-27 ENCOUNTER — Encounter: Payer: Self-pay | Admitting: Family Medicine

## 2023-02-27 VITALS — BP 113/67 | HR 63 | Ht 73.0 in | Wt 194.0 lb

## 2023-02-27 DIAGNOSIS — F17200 Nicotine dependence, unspecified, uncomplicated: Secondary | ICD-10-CM | POA: Diagnosis not present

## 2023-02-27 DIAGNOSIS — I10 Essential (primary) hypertension: Secondary | ICD-10-CM | POA: Diagnosis not present

## 2023-02-27 DIAGNOSIS — R7301 Impaired fasting glucose: Secondary | ICD-10-CM

## 2023-02-27 DIAGNOSIS — K5909 Other constipation: Secondary | ICD-10-CM

## 2023-02-27 DIAGNOSIS — N1831 Chronic kidney disease, stage 3a: Secondary | ICD-10-CM

## 2023-02-27 DIAGNOSIS — E785 Hyperlipidemia, unspecified: Secondary | ICD-10-CM

## 2023-02-27 LAB — CBC: WBC: 8.6 10*3/uL (ref 3.8–10.8)

## 2023-02-27 NOTE — Progress Notes (Signed)
Established Patient Office Visit  Subjective   Patient ID: Raymond Mitchell, male    DOB: 1955/10/26  Age: 67 y.o. MRN: [id number]  Chief Complaint  Patient presents with   Hypertension    HPI  Hypertension- Pt denies chest pain, SOB, dizziness, or heart palpitations.  Taking meds as directed w/o problems.  Denies medication side effects.    He did get a new job since I last saw him he switched jobs in January he still working a lot of long hours and is stressful but feels like it is better than the situation he was then previously he has been trying to exercise 2 to 3 days a week getting on the treadmill for about a mile and then using his Bowflex for strength training.  He is having more pain in his feet that seems to be getting a little bit worse.  He had custom inserts made about 6 to 7 years ago.  He now feels like it starting to affect his back.  He has had times where he feels really more tired on the weekend it is a little harder to get motivated he is not sure if it is just because he works so hard during the week and he is tired or if something else is going on.  He did have a renal follow-up in December and had great lab results and they are seeing him back in a year.  No other recent changes.  He hydrates really well during the week but maybe not so much on the weekends     ROS    Objective:     BP 113/67   Pulse 63   Ht 6\' 1"  (1.854 m)   Wt 194 lb (88 kg)   SpO2 99%   BMI 25.60 kg/m    Physical Exam Constitutional:      Appearance: He is well-developed.  HENT:     Head: Normocephalic and atraumatic.  Cardiovascular:     Rate and Rhythm: Normal rate and regular rhythm.     Heart sounds: Normal heart sounds.  Pulmonary:     Effort: Pulmonary effort is normal.     Breath sounds: Normal breath sounds.  Skin:    General: Skin is warm and dry.  Neurological:     Mental Status: He is alert and oriented to person, place, and time.  Psychiatric:         Behavior: Behavior normal.      No results found for any visits on 02/27/23.    The 10-year ASCVD risk score (Arnett DK, et al., 2019) is: 15.6%    Assessment & Plan:   Problem List Items Addressed This Visit       Cardiovascular and Mediastinum   Essential hypertension, benign   Relevant Orders   Vitamin B1   Lipid Panel w/reflex Direct LDL   COMPLETE METABOLIC PANEL WITH GFR   CBC     Endocrine   IFG (impaired fasting glucose) - Primary    Dur for A1C       Relevant Orders   Vitamin B1   Lipid Panel w/reflex Direct LDL   COMPLETE METABOLIC PANEL WITH GFR   CBC   Hemoglobin A1c     Genitourinary   Stage 3a chronic kidney disease (HCC)    Follows with nephrology has an appointment coming up in December.  They were able to decrease some of his medications last fall.      Relevant Orders  Vitamin B1   Lipid Panel w/reflex Direct LDL   COMPLETE METABOLIC PANEL WITH GFR   CBC     Other   Hyperlipidemia    Due to recheck lipids.      CONSTIPATION, DRUG INDUCED    Takes senna and has been trying to increase his fiber in his diet.      CIGARETTE SMOKER    Return in about 6 months (around 08/30/2023) for Hypertension.    Nani Gasser, MD

## 2023-02-27 NOTE — Assessment & Plan Note (Signed)
Dur for A1C

## 2023-02-27 NOTE — Assessment & Plan Note (Signed)
Follows with nephrology has an appointment coming up in December.  They were able to decrease some of his medications last fall.

## 2023-02-27 NOTE — Assessment & Plan Note (Signed)
Takes senna and has been trying to increase his fiber in his diet.

## 2023-02-27 NOTE — Assessment & Plan Note (Signed)
Due to recheck lipids. 

## 2023-02-28 LAB — COMPLETE METABOLIC PANEL WITH GFR
Alkaline phosphatase (APISO): 76 U/L (ref 35–144)
Creat: 1.18 mg/dL (ref 0.70–1.35)
Potassium: 4.4 mmol/L (ref 3.5–5.3)
Sodium: 141 mmol/L (ref 135–146)
Total Bilirubin: 0.4 mg/dL (ref 0.2–1.2)
eGFR: 68 mL/min/{1.73_m2} (ref >=60)

## 2023-02-28 LAB — HEMOGLOBIN A1C
Hgb A1c MFr Bld: 5.9 % of total Hgb — ABNORMAL HIGH (ref <5.7)
Mean Plasma Glucose: 123 mg/dL
eAG (mmol/L): 6.8 mmol/L

## 2023-02-28 LAB — CBC
MCH: 34.1 pg — ABNORMAL HIGH (ref 27.0–33.0)
MPV: 9.4 fL (ref 7.5–12.5)

## 2023-02-28 NOTE — Progress Notes (Signed)
Hi Raymond Mitchell, A1c is up a little bit at 5.9 it was 5.7 last time just really encouraged her to make sure you are working on those healthy food choices and trying to portion control the carbohydrate intake and try to avoid sweets is much as possible.  Hemoglobin looks much better.  Count metabolic panel are normal.  Vitamin B1 is still pending.  Call the lab and see if we can add a folic acid level.

## 2023-03-02 LAB — COMPLETE METABOLIC PANEL WITH GFR
ALT: 18 U/L (ref 9–46)
Calcium: 9 mg/dL (ref 8.6–10.3)
Chloride: 105 mmol/L (ref 98–110)
Glucose, Bld: 94 mg/dL (ref 65–139)
Total Protein: 6.3 g/dL (ref 6.1–8.1)

## 2023-03-02 LAB — LIPID PANEL W/REFLEX DIRECT LDL: Cholesterol: 132 mg/dL (ref <200)

## 2023-03-02 LAB — CBC: RDW: 11.8 % (ref 11.0–15.0)

## 2023-03-05 LAB — COMPLETE METABOLIC PANEL WITH GFR
AG Ratio: 1.9 (calc) (ref 1.0–2.5)
AST: 19 U/L (ref 10–35)
Albumin: 4.1 g/dL (ref 3.6–5.1)
BUN: 21 mg/dL (ref 7–25)
CO2: 27 mmol/L (ref 20–32)
Globulin: 2.2 g/dL (calc) (ref 1.9–3.7)

## 2023-03-05 LAB — LIPID PANEL W/REFLEX DIRECT LDL
HDL: 44 mg/dL (ref >=40)
LDL Cholesterol (Calc): 71 mg/dL (calc)
Non-HDL Cholesterol (Calc): 88 mg/dL (calc) (ref <130)
Total CHOL/HDL Ratio: 3 (calc) (ref <5.0)
Triglycerides: 91 mg/dL (ref <150)

## 2023-03-05 LAB — CBC
HCT: 41.4 % (ref 38.5–50.0)
Hemoglobin: 14 g/dL (ref 13.2–17.1)
MCHC: 33.8 g/dL (ref 32.0–36.0)
MCV: 100.7 fL — ABNORMAL HIGH (ref 80.0–100.0)
Platelets: 228 10*3/uL (ref 140–400)
RBC: 4.11 10*6/uL — ABNORMAL LOW (ref 4.20–5.80)

## 2023-03-05 LAB — VITAMIN B1: Vitamin B1 (Thiamine): 19 nmol/L (ref 8–30)

## 2023-03-05 LAB — FOLATE: Folate: 18.3 ng/mL

## 2023-03-06 NOTE — Progress Notes (Signed)
Folate is normal.

## 2023-03-21 ENCOUNTER — Other Ambulatory Visit: Payer: Self-pay | Admitting: Family Medicine

## 2023-03-21 DIAGNOSIS — E785 Hyperlipidemia, unspecified: Secondary | ICD-10-CM

## 2023-03-28 ENCOUNTER — Telehealth: Payer: Self-pay

## 2023-03-28 ENCOUNTER — Encounter: Payer: Self-pay | Admitting: Family Medicine

## 2023-03-28 ENCOUNTER — Ambulatory Visit (INDEPENDENT_AMBULATORY_CARE_PROVIDER_SITE_OTHER): Payer: Medicare Other | Admitting: Family Medicine

## 2023-03-28 VITALS — Ht 73.0 in | Wt 190.0 lb

## 2023-03-28 DIAGNOSIS — H8112 Benign paroxysmal vertigo, left ear: Secondary | ICD-10-CM

## 2023-03-28 DIAGNOSIS — R002 Palpitations: Secondary | ICD-10-CM

## 2023-03-28 DIAGNOSIS — R42 Dizziness and giddiness: Secondary | ICD-10-CM

## 2023-03-28 NOTE — Telephone Encounter (Signed)
Raymond Mitchell reports feeling dizzy. He does feel better today. Denies shortness of breath or chest pain. Advised him if he starts to feel worse to go to the ED. He is wanting to keep his 11:30 appointment with Dr Linford Arnold.

## 2023-03-28 NOTE — Progress Notes (Signed)
   Established Patient Office Visit  Subjective   Patient ID: Raymond Mitchell, male    DOB: 1956-04-20  Age: 67 y.o. MRN: [id number]  Chief Complaint  Patient presents with   Dizziness   Palpitations    HPI Woke up feeling dizzy and room spinning around 4:30 AM. Tried to make coffee and had to lay back down.  Got up later in AM and had a couple of sips of coffee and check BP and it ws high in 178/80.  Ate a little and vomited once.  Also felt some palpitations.       ROS    Objective:     Ht 6\' 1"  (1.854 m)   Wt 190 lb (86.2 kg)   SpO2 97%   BMI 25.07 kg/m    Physical Exam Constitutional:      Appearance: He is well-developed.  HENT:     Head: Normocephalic and atraumatic.     Right Ear: Tympanic membrane, ear canal and external ear normal.     Left Ear: Tympanic membrane, ear canal and external ear normal.     Nose: Nose normal.     Mouth/Throat:     Pharynx: Oropharynx is clear.  Eyes:     Conjunctiva/sclera: Conjunctivae normal.     Pupils: Pupils are equal, round, and reactive to light.  Neck:     Thyroid: No thyromegaly.  Cardiovascular:     Rate and Rhythm: Normal rate and regular rhythm.     Heart sounds: Normal heart sounds.  Pulmonary:     Effort: Pulmonary effort is normal.     Breath sounds: Normal breath sounds.  Musculoskeletal:     Cervical back: Neck supple.  Lymphadenopathy:     Cervical: No cervical adenopathy.  Skin:    General: Skin is warm and dry.  Neurological:     Mental Status: He is alert and oriented to person, place, and time.     Comments: + Diks Hallpike maneuver to the left.   Psychiatric:        Behavior: Behavior normal.      No results found for any visits on 03/28/23.    The 10-year ASCVD risk score (Arnett DK, et al., 2019) is: 14.8%    Assessment & Plan:   Problem List Items Addressed This Visit       Nervous and Auditory   BPPV (benign paroxysmal positional vertigo), left - Primary   Relevant Orders    Ambulatory referral to Physical Therapy   Other Visit Diagnoses     Dizziness       Palpitations       Relevant Orders   EKG 12-Lead      Palpitations.  Heart regular on exam.  EKG shows 64 bpm, normal sinus rhythm.  No acute ST-T wave changes.  No change from prior EKG from September 2023.  BBPV, left - discussed dx. H.O provided. Review home exercises.  Refer to vestibular rehab. Work note given.   Return if symptoms worsen or fail to improve.    Nani Gasser, MD

## 2023-03-30 ENCOUNTER — Other Ambulatory Visit: Payer: Self-pay

## 2023-03-30 ENCOUNTER — Encounter: Payer: Self-pay | Admitting: Rehabilitative and Restorative Service Providers"

## 2023-03-30 ENCOUNTER — Ambulatory Visit: Payer: Medicare Other | Attending: Family Medicine | Admitting: Rehabilitative and Restorative Service Providers"

## 2023-03-30 ENCOUNTER — Ambulatory Visit: Payer: Medicare Other | Admitting: Rehabilitative and Restorative Service Providers"

## 2023-03-30 DIAGNOSIS — R42 Dizziness and giddiness: Secondary | ICD-10-CM | POA: Insufficient documentation

## 2023-03-30 DIAGNOSIS — H8112 Benign paroxysmal vertigo, left ear: Secondary | ICD-10-CM | POA: Insufficient documentation

## 2023-03-30 DIAGNOSIS — R2681 Unsteadiness on feet: Secondary | ICD-10-CM | POA: Diagnosis present

## 2023-03-30 NOTE — Therapy (Signed)
OUTPATIENT PHYSICAL THERAPY VESTIBULAR EVALUATION   Patient Name: Raymond Mitchell MRN: [id number] DOB:June 05, 1956, 67 y.o., male Today's Date: 03/30/2023  END OF SESSION:  PT End of Session - 03/30/23 1151     Visit Number 1    Number of Visits 6    Date for PT Re-Evaluation 05/11/23    Authorization Type medicare, BCBS supplement    Progress Note Due on Visit 10    PT Start Time 1105    PT Stop Time 1145    PT Time Calculation (min) 40 min    Activity Tolerance Patient tolerated treatment well    Behavior During Therapy WFL for tasks assessed/performed            Past Medical History:  Diagnosis Date   Ankylosing spondylitis (HCC)    BPH (benign prostatic hyperplasia)    hx of   Diverticulosis    GERD (gastroesophageal reflux disease)    History of hiatal hernia    Hypertension    Lymphocytic colitis 01/15/2018   Pes planus    Right inguinal hernia    Tobacco user    Varicose veins    Past Surgical History:  Procedure Laterality Date   BACK SURGERY  12/2007   L 4 to L 5 fusion   BACK SURGERY  2012   disc repair   c spine ESI     Dr Penelope Galas   INGUINAL HERNIA REPAIR  1994   INGUINAL HERNIA REPAIR Right 07/16/2020   Procedure: LAPAROSCOPIC RIGHT INGUINAL HERNIA WITH MESH;  Surgeon: [name], De Blanch, MD;  Location: [facility] ;  Service: General;  Laterality: Right;   RTC surgery Left 2005   TOTAL HIP ARTHROPLASTY Right 02/14/2022   Procedure: TOTAL HIP ARTHROPLASTY ANTERIOR APPROACH;  Surgeon: Sheral Apley, MD;  Location: [facility];  Service: Orthopedics;  Laterality: Right;   Patient Active Problem List   Diagnosis Date Noted   Polycystic kidney 04/11/2022   Stage 3a chronic kidney disease (HCC) 04/11/2022   S/P total right hip arthroplasty 02/14/2022   Primary osteoarthritis of right knee 09/16/2021   Primary osteoarthritis of right hip 09/16/2021   Rotator cuff tear, left 01/18/2021   Snoring 09/27/2020   Venous stasis 06/02/2020    IFG (impaired fasting glucose) 05/28/2019   Hiatal hernia 05/28/2019   BPPV (benign paroxysmal positional vertigo), left 11/26/2018   Allergic rhinitis due to pollen 11/26/2018   Lymphocytic colitis 01/15/2018   Right lumbar radiculopathy 09/12/2016   Hepatic cyst 11/11/2014   Cancer of skin, squamous cell 03/11/2014   Seborrheic dermatitis 09/05/2012   SCC (squamous cell carcinoma), leg 08/29/2011   Left cervical radiculopathy 01/02/2011   POSTURAL LIGHTHEADEDNESS 12/02/2010   CIGARETTE SMOKER 11/29/2010   Fatty liver 10/08/2009   CONSTIPATION, DRUG INDUCED 10/05/2009   DIVERTICULOSIS OF COLON 08/13/2009   DISC DISEASE, LUMBAR 07/08/2007   DISC DISEASE, CERVICAL 03/11/2007   BPH (benign prostatic hyperplasia) 02/13/2007   Hyperlipidemia 01/16/2007   ANKYLOSING SPONDYLITIS 12/05/2006   Essential hypertension, benign 12/05/2006    PCP: Nani Gasser, MD REFERRING PROVIDER: Nani Gasser, MD REFERRING DIAG: [phone] (ICD-10-CM) - BPPV (benign paroxysmal positional vertigo), left  THERAPY DIAG:  BPPV (benign paroxysmal positional vertigo), left  Dizziness and giddiness  Unsteadiness on feet  ONSET DATE: 03/27/23 Rationale for Evaluation and Treatment: Rehabilitation  SUBJECTIVE:  SUBJECTIVE STATEMENT: The patient has h/o prior episodes of vertigo that lasted 1-2 days, another time 3 days (but not sevvere). He awoke on Tuesday 03/27/23 with constant sensation  of room spinning, nausea, unsteadiness. He notes some improvement since onset. After Tuesday, constant spinning stopped. Pt accompanied by: self PERTINENT HISTORY: arthritis, ankylosing spodylitis, HTN, hyperlipidemia PAIN:  Are you having pain? No PRECAUTIONS: Fall WEIGHT BEARING RESTRICTIONS: No FALLS: Has patient fallen in last 6 months? No LIVING ENVIRONMENT: Lives with: lives with their spouse Lives in: House/apartment Works in service at [employer] PLOF: Independent PATIENT GOALS: reduce  dizziness  OBJECTIVE:  SENSATION: WFL  Cervical ROM:  tightness noted  PATIENT SURVEYS:  N/a-- due to dx  VESTIBULAR ASSESSMENT:  GENERAL OBSERVATION: Pt walks into clinic slowed pace with wide base of support.   SYMPTOM BEHAVIOR:  Subjective history: Sudden onset of spinning vertigo on Tuesday  Non-Vestibular symptoms:  none  Type of dizziness: Spinning/Vertigo  Frequency: was constant at start, now worse with movement  Duration: constant  Aggravating factors: Induced by motion: turning head quickly and laying down to the left  Relieving factors: head stationary  Progression of symptoms: better  OCULOMOTOR EXAM:  Ocular Alignment: abnormal with mild L hypertropia (more noted with smooth pursuits)  Ocular ROM: No Limitations  Spontaneous Nystagmus: absent  Gaze-Induced Nystagmus: absent  Smooth Pursuits: intact  Saccades: intact Cover/Uncover test performed (due to hypertropia) and WNLs  *Wears progressive lenses  VESTIBULAR - OCULAR REFLEX:   Slow VOR: Comment: dec'd ROM, and blurry vision, mild dizziness  VOR Cancellation: Normal  Head-Impulse Test: HIT Right: positive HIT Left: positive *could not differentiate side-- patient with neck guarding  POSITIONAL TESTING: Right Dix-Hallpike: a very mild downbeat noted (may be due to severity of L upbeating == a small reversal) Left Dix-Hallpike: upbeating, left nystagmus x 30 seconds  [facility] Adult PT Treatment:                                                DATE: 03/30/23 CRT: L epley's maneuver x 3 reps Improvement on 2nd rep with only mild noted nystagmus, no nystagmus on 3rd rep  PATIENT EDUCATION: Education details: plan of care and education on BPPV Person educated: Patient Education method: Explanation Education comprehension: verbalized understanding  HOME EXERCISE PROGRAM: None today  GOALS: Goals reviewed with patient? Yes  LONG TERM GOALS: Target date: 05/11/23  The patient will be indep with HEP for  gaze adaptation and habituation, if indicated. Baseline:  to initiate 2nd visit. Goal status: INITIAL  2.  The patient will report no episodes of vertigo with bed mobility. Baseline:  vertigo noted with movement. Goal status: INITIAL  3.  The patient will tolerate gaze x 30 seconds without complaints of visual blurring. Baseline:  Notes blurring with passive VOR x 1 viewing Goal status: INITIAL  4.  The patient will have a negative L dix hallpike test. Baseline:  + at eval Goal status: INITIAL  ASSESSMENT:  CLINICAL IMPRESSION: Patient is a 67 y.o. male who was seen today for physical therapy evaluation and treatment for L BPPV, as well as possible hypofunction per abnormal VOR testing. The patient's positional testing indicative of L posterior canalithiasis and head impulse test demonstrates dec'd ability to maintain gaze with head motion. PT treated with Epley's canolith repositioning today. Plan to treat positional symptoms and then provide HEP for gaze adaptation once cleared. Patient going out of town next week (returns 7/15) will schedule for f/u on 7/22.   OBJECTIVE IMPAIRMENTS: decreased activity tolerance,  decreased balance, dizziness, and impaired vision/preception.   ACTIVITY LIMITATIONS: bending, bed mobility, and working  PARTICIPATION LIMITATIONS: occupation  PERSONAL FACTORS: 1-2 comorbidities: prior h/o vertigo, neck tightness  are also affecting patient's functional outcome.   REHAB POTENTIAL: Good  CLINICAL DECISION MAKING: Stable/uncomplicated  EVALUATION COMPLEXITY: Low   PLAN:  PT FREQUENCY: 1x/week  PT DURATION: 6 weeks  PLANNED INTERVENTIONS: Therapeutic exercises, Therapeutic activity, Neuromuscular re-education, Balance training, Gait training, Patient/Family education, Self Care, Vestibular training, Canalith repositioning, Visual/preceptual remediation/compensation, Manual therapy, and Re-evaluation  PLAN FOR NEXT SESSION: check BPPV, teach gaze  x 1 viewing; check goals   Jaylenn Baiza, PT 03/30/2023, 11:52 AM

## 2023-04-11 ENCOUNTER — Other Ambulatory Visit: Payer: Self-pay | Admitting: Family Medicine

## 2023-04-23 ENCOUNTER — Encounter: Payer: BLUE CROSS/BLUE SHIELD | Admitting: Rehabilitative and Restorative Service Providers"

## 2023-04-24 ENCOUNTER — Other Ambulatory Visit: Payer: Self-pay | Admitting: Family Medicine

## 2023-04-24 DIAGNOSIS — M5416 Radiculopathy, lumbar region: Secondary | ICD-10-CM

## 2023-06-01 ENCOUNTER — Other Ambulatory Visit: Payer: Self-pay | Admitting: Family Medicine

## 2023-06-11 ENCOUNTER — Other Ambulatory Visit: Payer: Self-pay | Admitting: Sports Medicine

## 2023-06-11 DIAGNOSIS — M5412 Radiculopathy, cervical region: Secondary | ICD-10-CM

## 2023-07-02 ENCOUNTER — Ambulatory Visit (INDEPENDENT_AMBULATORY_CARE_PROVIDER_SITE_OTHER): Payer: Medicare Other | Admitting: Family Medicine

## 2023-07-02 VITALS — BP 121/80 | HR 76 | Ht 73.0 in | Wt 197.0 lb

## 2023-07-02 DIAGNOSIS — Z0000 Encounter for general adult medical examination without abnormal findings: Secondary | ICD-10-CM | POA: Diagnosis not present

## 2023-07-02 DIAGNOSIS — F17200 Nicotine dependence, unspecified, uncomplicated: Secondary | ICD-10-CM

## 2023-07-02 DIAGNOSIS — Z23 Encounter for immunization: Secondary | ICD-10-CM | POA: Diagnosis not present

## 2023-07-02 DIAGNOSIS — Z122 Encounter for screening for malignant neoplasm of respiratory organs: Secondary | ICD-10-CM | POA: Diagnosis not present

## 2023-07-02 NOTE — Patient Instructions (Addendum)
MEDICARE ANNUAL WELLNESS VISIT Health Maintenance Summary and Written Plan of Care  Raymond Mitchell ,  Thank you for allowing me to perform your Medicare Annual Wellness Visit and for your ongoing commitment to your health.   Health Maintenance & Immunization History Health Maintenance  Topic Date Due   Lung Cancer Screening  Never done   COVID-19 Vaccine (5 - 2023-24 season) 07/18/2023 (Originally 06/03/2023)   Zoster Vaccines- Shingrix (2 of 2) 10/01/2023 (Originally 01/25/2022)   Medicare Annual Wellness (AWV)  07/01/2024   Colonoscopy  11/30/2024   DTaP/Tdap/Td (3 - Td or Tdap) 12/01/2030   Pneumonia Vaccine 41+ Years old  Completed   INFLUENZA VACCINE  Completed   Hepatitis C Screening  Completed   HPV VACCINES  Aged Out   Immunization History  Administered Date(s) Administered   Fluad Quad(high Dose 65+) 06/26/2022   Fluad Trivalent(High Dose 65+) 07/02/2023   Influenza Split 08/22/2012   Influenza Whole 07/08/2007, 10/08/2009, 05/30/2011   Influenza,inj,Quad PF,6+ Mos 07/02/2013, 07/09/2014, 09/06/2015, 09/12/2016, 05/28/2019, 06/02/2020, 06/02/2021   Influenza-Unspecified 05/02/2018   PFIZER(Purple Top)SARS-COV-2 Vaccination 12/19/2019, 01/09/2020, 08/05/2020   PNEUMOCOCCAL CONJUGATE-20 11/30/2021   Pfizer(Comirnaty)Fall Seasonal Vaccine 12 years and older 08/29/2022   Pneumococcal Polysaccharide-23 09/04/2017   Tdap 05/30/2011, 11/30/2020   Zoster Recombinant(Shingrix) 11/30/2021    These are the patient goals that we discussed:  Goals Addressed               This Visit's Progress     Patient Stated (pt-stated)        Patient stated that he would like to stay healthy.         This is a list of Health Maintenance Items that are overdue or due now: Influenza vaccine 2nd dose of shingles vaccine   Orders/Referrals Placed Today: Orders Placed This Encounter  Procedures   Flu Vaccine Trivalent High Dose (Fluad)   Ambulatory Referral Lung Cancer Screening  [facility]    Referral Priority:   Routine    Referral Type:   Consultation    Referral Reason:   Specialty Services Required    Number of Visits Requested:   1   (Contact our referral department at [phone] if you have not spoken with someone about your referral appointment within the next 5 days)    Follow-up Plan Follow-up with Agapito Games, MD as planned Medicare wellness visit in one year.  AVS printed and given to the patient.     Health Maintenance, Male Adopting a healthy lifestyle and getting preventive care are important in promoting health and wellness. Ask your health care provider about: The right schedule for you to have regular tests and exams. Things you can do on your own to prevent diseases and keep yourself healthy. What should I know about diet, weight, and exercise? Eat a healthy diet  Eat a diet that includes plenty of vegetables, fruits, low-fat dairy products, and lean protein. Do not eat a lot of foods that are high in solid fats, added sugars, or sodium. Maintain a healthy weight Body mass index (BMI) is a measurement that can be used to identify possible weight problems. It estimates body fat based on height and weight. Your health care provider can help determine your BMI and help you achieve or maintain a healthy weight. Get regular exercise Get regular exercise. This is one of the most important things you can do for your health. Most adults should: Exercise for at least 150 minutes each week. The exercise should increase your  heart rate and make you sweat (moderate-intensity exercise). Do strengthening exercises at least twice a week. This is in addition to the moderate-intensity exercise. Spend less time sitting. Even light physical activity can be beneficial. Watch cholesterol and blood lipids Have your blood tested for lipids and cholesterol at 66 years of age, then have this test every 5 years. You may need to have your  cholesterol levels checked more often if: Your lipid or cholesterol levels are high. You are older than 66 years of age. You are at high risk for heart disease. What should I know about cancer screening? Many types of cancers can be detected early and may often be prevented. Depending on your health history and family history, you may need to have cancer screening at various ages. This may include screening for: Colorectal cancer. Prostate cancer. Skin cancer. Lung cancer. What should I know about heart disease, diabetes, and high blood pressure? Blood pressure and heart disease High blood pressure causes heart disease and increases the risk of stroke. This is more likely to develop in people who have high blood pressure readings or are overweight. Talk with your health care provider about your target blood pressure readings. Have your blood pressure checked: Every 3-5 years if you are 61-58 years of age. Every year if you are 71 years old or older. If you are between the ages of 34 and 64 and are a current or former smoker, ask your health care provider if you should have a one-time screening for abdominal aortic aneurysm (AAA). Diabetes Have regular diabetes screenings. This checks your fasting blood sugar level. Have the screening done: Once every three years after age 81 if you are at a normal weight and have a low risk for diabetes. More often and at a younger age if you are overweight or have a high risk for diabetes. What should I know about preventing infection? Hepatitis B If you have a higher risk for hepatitis B, you should be screened for this virus. Talk with your health care provider to find out if you are at risk for hepatitis B infection. Hepatitis C Blood testing is recommended for: Everyone born from [birth year] through 1964. Anyone with known risk factors for hepatitis C. Sexually transmitted infections (STIs) You should be screened each year for STIs, including gonorrhea  and chlamydia, if: You are sexually active and are younger than 66 years of age. You are older than 66 years of age and your health care provider tells you that you are at risk for this type of infection. Your sexual activity has changed since you were last screened, and you are at increased risk for chlamydia or gonorrhea. Ask your health care provider if you are at risk. Ask your health care provider about whether you are at high risk for HIV. Your health care provider may recommend a prescription medicine to help prevent HIV infection. If you choose to take medicine to prevent HIV, you should first get tested for HIV. You should then be tested every 3 months for as long as you are taking the medicine. Follow these instructions at home: Alcohol use Do not drink alcohol if your health care provider tells you not to drink. If you drink alcohol: Limit how much you have to 0-2 drinks a day. Know how much alcohol is in your drink. In the [place] one drink equals one 12 oz bottle of beer (355 mL), one 5 oz glass of wine (148 mL), or one 1 oz glass of  hard liquor (44 mL). Lifestyle Do not use any products that contain nicotine or tobacco. These products include cigarettes, chewing tobacco, and vaping devices, such as e-cigarettes. If you need help quitting, ask your health care provider. Do not use street drugs. Do not share needles. Ask your health care provider for help if you need support or information about quitting drugs. General instructions Schedule regular health, dental, and eye exams. Stay current with your vaccines. Tell your health care provider if: You often feel depressed. You have ever been abused or do not feel safe at home. Summary Adopting a healthy lifestyle and getting preventive care are important in promoting health and wellness. Follow your health care provider's instructions about healthy diet, exercising, and getting tested or screened for diseases. Follow your health  care provider's instructions on monitoring your cholesterol and blood pressure. This information is not intended to replace advice given to you by your health care provider. Make sure you discuss any questions you have with your health care provider. Document Revised: 02/07/2021 Document Reviewed: 02/07/2021 Elsevier Patient Education  2024 [employer].

## 2023-07-02 NOTE — Progress Notes (Signed)
MEDICARE ANNUAL WELLNESS VISIT  07/02/2023  Subjective:  Raymond Mitchell is a 67 y.o. male patient of Metheney, Barbarann Ehlers, MD who had a Medicare Annual Wellness Visit today. Raymond Mitchell is Working full time and lives with their spouse. Raymond Mitchell does not have any children. Raymond Mitchell reports that Raymond Mitchell is socially active and does interact with friends/family regularly. Raymond Mitchell is moderately physically active and enjoys golf, working out and doing work around [employer].  Patient Care Team: Agapito Games, MD as PCP - General (Family Medicine)     07/02/2023    9:10 AM 03/30/2023   11:10 AM 02/16/2022   11:48 AM 02/06/2022   10:12 AM 03/29/2021    9:36 AM 07/16/2020    8:52 AM 03/09/2014   11:40 AM  Advanced Directives  Does Patient Have a Medical Advance Directive? No No No No Yes No Patient does not have advance directive;Patient would not like information  Type of Advance Directive     Healthcare Power of Attorney    Copy of Healthcare Power of Attorney in Chart?     No - copy requested    Would patient like information on creating a medical advance directive? No - Patient declined Yes (MAU/Ambulatory/Procedural Areas - Information given) No - Patient declined   No - Patient declined     Hospital Utilization Over the Past 12 Months: # of hospitalizations or ER visits: 0 # of surgeries: 0  Review of Systems    Patient reports that his overall health is better when compared to last year.  Review of Systems: History obtained from chart review and the patient  All other systems negative.  Pain Assessment Pain : No/denies pain     Current Medications & Allergies (verified) Allergies as of 07/02/2023       Reactions   Nsaids Other (See Comments)   Per gastroenterologist         Medication List        Accurate as of July 02, 2023  9:42 AM. If you have any questions, ask your nurse or doctor.          aspirin EC 81 MG tablet Take 1 tablet (81 mg total) by mouth 2 (two) times  daily. For DVT prophylaxis for 30 days after surgery.   atorvastatin 40 MG tablet Commonly known as: LIPITOR TAKE 1 TABLET BY MOUTH EVERY DAY   [facility] Senna Plus 8.6-50 MG tablet Generic drug: senna-docusate TAKE 2 TABLETS BY MOUTH AT BEDTIME   fish oil-omega-3 fatty acids 1000 MG capsule Take 1 g by mouth daily.   gabapentin 300 MG capsule Commonly known as: NEURONTIN TAKE 1 CAPSULE BY MOUTH IN THE MORNING AND 2 CAPSULES AT BEDTIME   HYDROcodone-acetaminophen 10-325 MG tablet Commonly known as: NORCO Take 1 tablet by mouth 3 (three) times daily as needed for severe pain.   ipratropium 0.03 % nasal spray Commonly known as: ATROVENT SPRAY 2 SPRAYS INTO EACH NOSTRIL IN THE MORNING   ketoconazole 2 % shampoo Commonly known as: NIZORAL Apply 1 Application topically 3 (three) times a week.   lisinopril 10 MG tablet Commonly known as: ZESTRIL TAKE 1 TABLET BY MOUTH EVERY DAY   metoprolol succinate 50 MG 24 hr tablet Commonly known as: TOPROL-XL Take 1 tablet by mouth daily.   Misc. Devices Misc To whom it may concern, [name] is a patient here at [facility].  Raymond Mitchell takes Norco 10/325 3 times daily as needed for pain.   multivitamin per tablet  Take 1 tablet by mouth daily.   Potassium 99 MG Tabs Take 1-2 tablets by mouth See admin instructions. 2 tabs in the morning, 1 tab at bedtime   tamsulosin 0.4 MG Caps capsule Commonly known as: FLOMAX TAKE 1 CAPSULE BY MOUTH EVERYDAY AT BEDTIME   thiamine 100 MG tablet Commonly known as: VITAMIN B1 Take 100 mg by mouth daily.   vitamin C with rose hips 500 MG tablet Take 500 mg by mouth daily.   zinc gluconate 50 MG tablet Take 50 mg by mouth daily.        History (reviewed): Past Medical History:  Diagnosis Date   Ankylosing spondylitis (HCC)    BPH (benign prostatic hyperplasia)    hx of   Diverticulosis    GERD (gastroesophageal reflux disease)    History of hiatal hernia    Hypertension     Lymphocytic colitis 01/15/2018   Pes planus    Right inguinal hernia    Tobacco user    Varicose veins    Past Surgical History:  Procedure Laterality Date   BACK SURGERY  12/2007   L 4 to L 5 fusion   BACK SURGERY  2012   disc repair   c spine ESI     Dr Penelope Galas   INGUINAL HERNIA REPAIR  1994   INGUINAL HERNIA REPAIR Right 07/16/2020   Procedure: LAPAROSCOPIC RIGHT INGUINAL HERNIA WITH MESH;  Surgeon: [name], De Blanch, MD;  Location: [facility] [place];  Service: General;  Laterality: Right;   RTC surgery Left 2005   TOTAL HIP ARTHROPLASTY Right 02/14/2022   Procedure: TOTAL HIP ARTHROPLASTY ANTERIOR APPROACH;  Surgeon: Sheral Apley, MD;  Location: [facility];  Service: Orthopedics;  Laterality: Right;   Family History  Problem Relation Age of Onset   Hyperlipidemia Mother    Hypertension Mother    Diabetes Mother    Hyperlipidemia Father    Hypertension Father    Other Father        ank spondy   Other Brother        Brain tumor, CVA   Other Brother        ank spondy   Social History   Socioeconomic History   Marital status: Married    Spouse name: [name]   Number of children: 0   Years of education: 13   Highest education level: Some college, no degree  Occupational History   Occupation: Building services engineer    Comment: Full time  Tobacco Use   Smoking status: Former    Current packs/day: 0.00    Average packs/day: 1 pack/day for 40.0 years (40.0 ttl pk-yrs)    Types: Cigarettes    Start date: 12/28/1971    Quit date: 12/28/2011    Years since quitting: 11.5   Smokeless tobacco: Never   Tobacco comments:    1 pack a day   Vaping Use   Vaping status: Never Used  Substance and Sexual Activity   Alcohol use: Yes    Alcohol/week: 21.0 standard drinks of alcohol    Types: 21 Standard drinks or equivalent per week    Comment: occ wine   Drug use: Never   Sexual activity: Not on file  Other Topics Concern   Not on file  Social History  Narrative   Lives with spouse. Raymond Mitchell doesn't have any children but does have step children and grand children. 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 Raymond Mitchell does enjoy golf, working out and  doing work around the house.   Social Determinants of Health   Financial Resource Strain: Low Risk  (07/02/2023)   Overall Financial Resource Strain (CARDIA)    Difficulty of Paying Living Expenses: Not hard at all  Food Insecurity: No Food Insecurity (07/02/2023)   Hunger Vital Sign    Worried About Running Out of Food in the Last Year: Never true    Ran Out of Food in the Last Year: Never true  Transportation Needs: No Transportation Needs (07/02/2023)   PRAPARE - Administrator, Civil Service (Medical): No    Lack of Transportation (Non-Medical): No  Physical Activity: Sufficiently Active (07/02/2023)   Exercise Vital Sign    Days of Exercise per Week: 3 days    Minutes of Exercise per Session: 50 min  Stress: No Stress Concern Present (07/02/2023)   [employer] of Occupational Health - Occupational Stress Questionnaire    Feeling of Stress : Not at all  Social Connections: Moderately Isolated (07/02/2023)   Social Connection and Isolation Panel [NHANES]    Frequency of Communication with Friends and Family: More than three times a week    Frequency of Social Gatherings with Friends and Family: Twice a week    Attends Religious Services: Never    Database administrator or Organizations: No    Attends Banker Meetings: Never    Marital Status: Married    Activities of Daily Living    07/02/2023    9:14 AM  In your present state of health, do you have any difficulty performing the following activities:  Hearing? 0  Vision? 1  Comment some eye pain- has an eye exam scheduled  Difficulty concentrating or making decisions? 0  Walking or climbing stairs? 0  Dressing or bathing? 0  Doing errands, shopping? 0  Preparing Food and eating ? N  Using the Toilet? N   In the past six months, have you accidently leaked urine? N  Do you have problems with loss of bowel control? N  Managing your Medications? N  Managing your Finances? N  Housekeeping or managing your Housekeeping? N    Patient Education/Literacy How often do you need to have someone help you when you read instructions, pamphlets, or other written materials from your doctor or pharmacy?: 1 - Never What is the last grade level you completed in school?: Some college  Exercise    Diet Patient reports consuming 3 meals a day and 3 snack(s) a day Patient reports that his primary diet is: Regular Patient reports that she does have regular access to food.   Depression Screen    07/02/2023    9:10 AM 06/26/2022    9:02 AM 11/30/2021    8:23 AM 06/02/2021    8:11 AM 05/04/2020   11:36 AM 05/28/2019    9:03 AM 09/04/2017   10:47 AM  PHQ 2/9 Scores  PHQ - 2 Score 0 0 0 0 0 0 0     Fall Risk    07/02/2023    9:10 AM 06/26/2022    9:00 AM 11/30/2021    8:22 AM 06/02/2021    8:11 AM 05/04/2020   11:36 AM  Fall Risk   Falls in the past year? 0 1 0 0 0  Number falls in past yr: 0 1 0 0   Injury with Fall? 0 1 0 0   Risk for fall due to : No Fall Risks Other (Comment) No Fall Risks No Fall Risks  No Fall Risks  Risk for fall due to: Comment  tripped     Follow up Falls evaluation completed Falls evaluation completed Falls prevention discussed;Falls evaluation completed Falls prevention discussed;Falls evaluation completed      Objective:   BP 121/80 (BP Location: Right Arm, Patient Position: Sitting)   Pulse 76   Ht 6\' 1"  (1.854 m)   Wt 197 lb (89.4 kg)   SpO2 92%   BMI 25.99 kg/m   Last Weight  Most recent update: 07/02/2023  9:02 AM    Weight  89.4 kg (197 lb)             Body mass index is 25.99 kg/m.  Hearing/Vision  Raymond Mitchell did not have difficulty with hearing/understanding during the face-to-face interview Raymond Mitchell did not have difficulty with his vision during the  face-to-face interview Reports that Raymond Mitchell has not had a formal eye exam by an eye care professional within the past year Reports that Raymond Mitchell has not had a formal hearing evaluation within the past year  Cognitive Function:    07/02/2023    9:15 AM 06/26/2022    9:03 AM  6CIT Screen  What Year? 0 points 0 points  What month? 0 points 0 points  What time? 0 points 0 points  Count back from 20 0 points 0 points  Months in reverse 0 points 0 points  Repeat phrase 0 points 0 points  Total Score 0 points 0 points    Normal Cognitive Function Screening: Yes (Normal:0-7, Significant for Dysfunction: >8)  Immunization & Health Maintenance Record Immunization History  Administered Date(s) Administered   Fluad Quad(high Dose 65+) 06/26/2022   Fluad Trivalent(High Dose 65+) 07/02/2023   Influenza Split 08/22/2012   Influenza Whole 07/08/2007, 10/08/2009, 05/30/2011   Influenza,inj,Quad PF,6+ Mos 07/02/2013, 07/09/2014, 09/06/2015, 09/12/2016, 05/28/2019, 06/02/2020, 06/02/2021   Influenza-Unspecified 05/02/2018   PFIZER(Purple Top)SARS-COV-2 Vaccination 12/19/2019, 01/09/2020, 08/05/2020   PNEUMOCOCCAL CONJUGATE-20 11/30/2021   Pfizer(Comirnaty)Fall Seasonal Vaccine 12 years and older 08/29/2022   Pneumococcal Polysaccharide-23 09/04/2017   Tdap 05/30/2011, 11/30/2020   Zoster Recombinant(Shingrix) 11/30/2021    Health Maintenance  Topic Date Due   Lung Cancer Screening  Never done   COVID-19 Vaccine (5 - 2023-24 season) 07/18/2023 (Originally 06/03/2023)   Zoster Vaccines- Shingrix (2 of 2) 10/01/2023 (Originally 01/25/2022)   Medicare Annual Wellness (AWV)  07/01/2024   Colonoscopy  11/30/2024   DTaP/Tdap/Td (3 - Td or Tdap) 12/01/2030   Pneumonia Vaccine 71+ Years old  Completed   INFLUENZA VACCINE  Completed   Hepatitis C Screening  Completed   HPV VACCINES  Aged Out       Assessment  This is a routine wellness examination for Raymond Mitchell.  Health Maintenance: Due or  Overdue Health Maintenance Due  Topic Date Due   Lung Cancer Screening  Never done    Raymond Mitchell does not need a referral for Community Assistance: Care Management:   no Social Work:    no Prescription Assistance:  no Nutrition/Diabetes Education:  no   Plan:  Personalized Goals  Goals Addressed               This Visit's Progress     Patient Stated (pt-stated)        Patient stated that Raymond Mitchell would like to stay healthy.       Personalized Health Maintenance & Screening Recommendations  Influenza vaccine 2nd dose of shingles vaccine  Lung Cancer Screening Recommended: yes (Low Dose CT Chest recommended if Age  50-80 years, 20 pack-year currently smoking OR have quit w/in past 15 years) Hepatitis C Screening recommended: no HIV Screening recommended: no  Advanced Directives: Written information was not given per the patient's request.  Referrals & Orders Orders Placed This Encounter  Procedures   Flu Vaccine Trivalent High Dose (Fluad)   Ambulatory Referral Lung Cancer Screening [facility]    Follow-up Plan Follow-up with Agapito Games, MD as planned Medicare wellness visit in one year.  AVS printed and given to the patient.   I have personally reviewed and noted the following in the patient's chart:   Medical and social history Use of alcohol, tobacco or illicit drugs  Current medications and supplements Functional ability and status Nutritional status Physical activity Advanced directives List of other physicians Hospitalizations, surgeries, and ER visits in previous 12 months Vitals Screenings to include cognitive, depression, and falls Referrals and appointments  In addition, I have reviewed and discussed with patient certain preventive protocols, quality metrics, and best practice recommendations. A written personalized care plan for preventive services as well as general preventive health recommendations were provided to  patient.     Modesto Charon, RN BSN  07/02/2023

## 2023-07-09 ENCOUNTER — Other Ambulatory Visit: Payer: Self-pay | Admitting: Family Medicine

## 2023-07-09 DIAGNOSIS — I951 Orthostatic hypotension: Secondary | ICD-10-CM

## 2023-07-09 DIAGNOSIS — M5416 Radiculopathy, lumbar region: Secondary | ICD-10-CM

## 2023-08-06 ENCOUNTER — Ambulatory Visit (INDEPENDENT_AMBULATORY_CARE_PROVIDER_SITE_OTHER): Payer: Medicare Other | Admitting: Medical-Surgical

## 2023-08-06 ENCOUNTER — Encounter: Payer: Self-pay | Admitting: Medical-Surgical

## 2023-08-06 VITALS — BP 95/56 | HR 80 | Resp 20 | Ht 73.0 in | Wt 194.1 lb

## 2023-08-06 DIAGNOSIS — R197 Diarrhea, unspecified: Secondary | ICD-10-CM

## 2023-08-06 DIAGNOSIS — R509 Fever, unspecified: Secondary | ICD-10-CM | POA: Diagnosis not present

## 2023-08-06 DIAGNOSIS — E861 Hypovolemia: Secondary | ICD-10-CM

## 2023-08-06 LAB — POCT URINALYSIS DIP (CLINITEK)
Bilirubin, UA: NEGATIVE
Blood, UA: NEGATIVE
Glucose, UA: NEGATIVE mg/dL
Leukocytes, UA: NEGATIVE
Nitrite, UA: NEGATIVE
POC PROTEIN,UA: 30 — AB
Spec Grav, UA: 1.025 (ref 1.010–1.025)
Urobilinogen, UA: 0.2 U/dL
pH, UA: 5.5 (ref 5.0–8.0)

## 2023-08-06 NOTE — Patient Instructions (Addendum)
Running a urinalysis today  Plan to return stool specimen to the lab as soon as possible  Checking blood counts and electrolytes/kidney function.  Hold Lisinopril tonight. If BP less than 110/70 tomorrow night, hold Lisinopril again.   Work on hydration with Gatorade/Powerade and water.   Start small frequent snacks/meals of bland foods and increase to regular diet as tolerated.

## 2023-08-06 NOTE — Progress Notes (Signed)
        Established patient visit  History, exam, impression, and plan:  1. Diarrhea of presumed infectious origin Very pleasant 67 year old male presenting today with reports of recent illness with weakness that started on the evening of 10/29.  He went to work the next day but ended up coming home due to chills, shivering, and general malaise.  He went home and slept most of the day.  Reports that he did have a fever but did not check it.  On Thursday, his chills continued with diaphoretic episodes that he thinks was his fever breaking.  On Friday, he had no more fevers but was still not feeling well.  Notes that on Wednesday night he took Senokot as prescribed to combat constipation related to chronic opioid treatment.  That evening he started to have diarrhea and has continued to have diarrhea on average of 4 bowel movements daily.  Has seen no blood in his stool or black tarry stools.  Has not had any vomiting but has had a couple of episodes of nausea.  He is drinking fluids well although he has been drinking strictly water until they got some Gatorade for him yesterday.  Notes his urine is darker than normal.  Not eating as well as usual and feels that most foods do not taste right.  No known sick contacts.  No recent antibiotics or hospitalizations.  No recent travel.  Abdomen soft, nondistended, nontender.  Bowel sounds hyperactive to all 4 quadrants.  Has not been taking Senokot since his dose on Wednesday.  Advised to avoid this as well as over-the-counter antidiarrheals.  Plan for GI profile to evaluate for GI pathogens or possible C. difficile.  Checking labs as below due to known kidney function concerns and potential electrolyte imbalances. - CBC with Differential/Platelet - CMP14+EGFR - GI Profile, Stool, PCR  2. Hypotension due to hypovolemia Blood pressure is notably low today and has been so over the last few days at home.  This is likely related to hypovolemia in setting of frequent  diarrhea.  He has been working to increase his hydration however likely needs to add more electrolyte solution in the setting of Gatorade or Powerade.  Urinalysis normal.  Specific gravity in the normal range but on the higher end.  Checking labs as below.  Advised to hold lisinopril 10 mg tonight.  If blood pressure is lower than 110/70 tomorrow night, again hold lisinopril.  Work note provided with tentative return on 11/7. - POCT Urinalysis Dipstick - CBC with Differential/Platelet - CMP14+EGFR   Procedures performed this visit: None.  Return if symptoms worsen or fail to improve.  __________________________________ Thayer Ohm, DNP, APRN, FNP-BC Primary Care and Sports Medicine [facility]

## 2023-08-07 ENCOUNTER — Encounter: Payer: Self-pay | Admitting: Medical-Surgical

## 2023-08-07 LAB — CMP14+EGFR
ALT: 55 [IU]/L — ABNORMAL HIGH (ref 0–44)
AST: 46 [IU]/L — ABNORMAL HIGH (ref 0–40)
Albumin: 3.7 g/dL — ABNORMAL LOW (ref 3.9–4.9)
Alkaline Phosphatase: 79 [IU]/L (ref 44–121)
BUN/Creatinine Ratio: 16 (ref 10–24)
BUN: 21 mg/dL (ref 8–27)
Bilirubin Total: 0.2 mg/dL (ref 0.0–1.2)
CO2: 24 mmol/L (ref 20–29)
Calcium: 9.2 mg/dL (ref 8.6–10.2)
Chloride: 101 mmol/L (ref 96–106)
Creatinine, Ser: 1.35 mg/dL — ABNORMAL HIGH (ref 0.76–1.27)
Globulin, Total: 2.5 g/dL (ref 1.5–4.5)
Glucose: 72 mg/dL (ref 70–99)
Potassium: 4 mmol/L (ref 3.5–5.2)
Sodium: 141 mmol/L (ref 134–144)
Total Protein: 6.2 g/dL (ref 6.0–8.5)
eGFR: 58 mL/min/{1.73_m2} — ABNORMAL LOW (ref >59)

## 2023-08-07 LAB — CBC WITH DIFFERENTIAL/PLATELET
Basophils Absolute: 0.1 10*3/uL (ref 0.0–0.2)
Basos: 1 %
EOS (ABSOLUTE): 0.1 10*3/uL (ref 0.0–0.4)
Eos: 1 %
Hematocrit: 43.6 % (ref 37.5–51.0)
Hemoglobin: 14 g/dL (ref 13.0–17.7)
Immature Grans (Abs): 0.1 10*3/uL (ref 0.0–0.1)
Immature Granulocytes: 1 %
Lymphocytes Absolute: 2 10*3/uL (ref 0.7–3.1)
Lymphs: 23 %
MCH: 32.9 pg (ref 26.6–33.0)
MCHC: 32.1 g/dL (ref 31.5–35.7)
MCV: 102 fL — ABNORMAL HIGH (ref 79–97)
Monocytes Absolute: 1.5 10*3/uL — ABNORMAL HIGH (ref 0.1–0.9)
Monocytes: 17 %
Neutrophils Absolute: 4.9 10*3/uL (ref 1.4–7.0)
Neutrophils: 57 %
Platelets: 274 10*3/uL (ref 150–450)
RBC: 4.26 x10E6/uL (ref 4.14–5.80)
RDW: 11.7 % (ref 11.6–15.4)
WBC: 8.5 10*3/uL (ref 3.4–10.8)

## 2023-08-08 LAB — GI PROFILE, STOOL, PCR

## 2023-08-08 MED ORDER — AZITHROMYCIN 250 MG PO TABS: ORAL_TABLET | ORAL | 0 refills | Status: AC

## 2023-08-08 NOTE — Addendum Note (Signed)
Addended byChristen Butter on: 08/08/2023 01:49 PM   Modules accepted: Orders

## 2023-09-03 ENCOUNTER — Encounter: Payer: Self-pay | Admitting: Family Medicine

## 2023-09-03 ENCOUNTER — Ambulatory Visit (INDEPENDENT_AMBULATORY_CARE_PROVIDER_SITE_OTHER): Payer: Medicare Other | Admitting: Family Medicine

## 2023-09-03 VITALS — BP 129/78 | HR 71 | Ht 73.0 in | Wt 198.0 lb

## 2023-09-03 DIAGNOSIS — I10 Essential (primary) hypertension: Secondary | ICD-10-CM | POA: Diagnosis not present

## 2023-09-03 DIAGNOSIS — N1831 Chronic kidney disease, stage 3a: Secondary | ICD-10-CM | POA: Diagnosis not present

## 2023-09-03 DIAGNOSIS — Z23 Encounter for immunization: Secondary | ICD-10-CM | POA: Diagnosis not present

## 2023-09-03 DIAGNOSIS — R7301 Impaired fasting glucose: Secondary | ICD-10-CM | POA: Diagnosis not present

## 2023-09-03 DIAGNOSIS — R748 Abnormal levels of other serum enzymes: Secondary | ICD-10-CM

## 2023-09-03 LAB — POCT GLYCOSYLATED HEMOGLOBIN (HGB A1C): Hemoglobin A1C: 5.9 % — AB (ref 4.0–5.6)

## 2023-09-03 NOTE — Assessment & Plan Note (Signed)
A1c looks great today at 5.9.  Continue to work on healthy diet and regular exercise.  Follow-up in 6 months.

## 2023-09-03 NOTE — Assessment & Plan Note (Signed)
Due to update renal function and get updated urine microalbumin.

## 2023-09-03 NOTE — Progress Notes (Signed)
Established Patient Office Visit  Subjective   Patient ID: Raymond Mitchell, male    DOB: [birth year]  Age: 67 y.o. MRN: [id number]  No chief complaint on file.   HPI   Hypertension- Pt denies chest pain, SOB, dizziness, or heart palpitations.  Taking meds as directed w/o problems.  Denies medication side effects.    He did want to let me know about an episode several weeks ago where he was having a lot of headaches behind his eyes.  His blood pressures are running in the 140s to 150s.  He went to his eye doctor to see if maybe there was something going on with his eyes and he was told that everything looked good.  He said this episode lasted about 3 weeks and then just seem to resolve the headaches went away and his blood pressures went back to normal.  Impaired fasting glucose-no increased thirst or urination. No symptoms consistent with hypoglycemia.  He was also recently diagnosed with an episode of Campylobacter.  He was treated with a 5-day course of azithromycin he is actually feeling much better and feeling back to himself.    ROS    Objective:     BP 129/78   Pulse 71   Ht 6\' 1"  (1.854 m)   Wt 198 lb 0.6 oz (89.8 kg)   SpO2 95%   BMI 26.13 kg/m    Physical Exam Vitals and nursing note reviewed.  Constitutional:      Appearance: Normal appearance.  HENT:     Head: Normocephalic and atraumatic.  Eyes:     Conjunctiva/sclera: Conjunctivae normal.  Cardiovascular:     Rate and Rhythm: Normal rate and regular rhythm.  Pulmonary:     Effort: Pulmonary effort is normal.     Breath sounds: Normal breath sounds.  Skin:    General: Skin is warm and dry.  Neurological:     Mental Status: He is alert.  Psychiatric:        Mood and Affect: Mood normal.      Results for orders placed or performed in visit on 09/03/23  POCT HgB A1C  Result Value Ref Range   Hemoglobin A1C 5.9 (A) 4.0 - 5.6 %   HbA1c POC (<> result, manual entry)     HbA1c, POC (prediabetic  range)     HbA1c, POC (controlled diabetic range)        The 10-year ASCVD risk score (Arnett DK, et al., 2019) is: 13.2%    Assessment & Plan:   Problem List Items Addressed This Visit       Cardiovascular and Mediastinum   Essential hypertension, benign    Well controlled. Continue current regimen. Follow up in  20mo       Relevant Orders   CMP14+EGFR   Urine Microalbumin w/creat. ratio     Endocrine   IFG (impaired fasting glucose) - Primary    A1c looks great today at 5.9.  Continue to work on healthy diet and regular exercise.  Follow-up in 6 months.      Relevant Orders   POCT HgB A1C (Completed)   CMP14+EGFR   Urine Microalbumin w/creat. ratio     Genitourinary   Stage 3a chronic kidney disease (HCC)    Due to update renal function and get updated urine microalbumin.      Relevant Orders   CMP14+EGFR   Urine Microalbumin w/creat. ratio   Other Visit Diagnoses     Encounter for immunization  Relevant Orders   Pfizer Comirnaty Covid-19 Vaccine 34yrs & older (Completed)   Elevated liver enzymes       Relevant Orders   CMP14+EGFR       Return in about 6 months (around 03/03/2024) for Pre-diabetes.    Nani Gasser, MD

## 2023-09-03 NOTE — Assessment & Plan Note (Signed)
Well controlled. Continue current regimen. Follow up in  6 mo  

## 2023-09-04 LAB — CMP14+EGFR
ALT: 22 [IU]/L (ref 0–44)
AST: 23 [IU]/L (ref 0–40)
Albumin: 4 g/dL (ref 3.9–4.9)
Alkaline Phosphatase: 86 [IU]/L (ref 44–121)
BUN/Creatinine Ratio: 12 (ref 10–24)
BUN: 14 mg/dL (ref 8–27)
Bilirubin Total: 0.3 mg/dL (ref 0.0–1.2)
CO2: 22 mmol/L (ref 20–29)
Calcium: 9.3 mg/dL (ref 8.6–10.2)
Chloride: 105 mmol/L (ref 96–106)
Creatinine, Ser: 1.17 mg/dL (ref 0.76–1.27)
Globulin, Total: 2.2 g/dL (ref 1.5–4.5)
Glucose: 106 mg/dL — ABNORMAL HIGH (ref 70–99)
Potassium: 4.3 mmol/L (ref 3.5–5.2)
Sodium: 142 mmol/L (ref 134–144)
Total Protein: 6.2 g/dL (ref 6.0–8.5)
eGFR: 69 mL/min/{1.73_m2} (ref >59)

## 2023-09-04 LAB — MICROALBUMIN / CREATININE URINE RATIO
Creatinine, Urine: 236.6 mg/dL
Microalb/Creat Ratio: 11 mg/g{creat} (ref 0–29)
Microalbumin, Urine: 26.6 ug/mL

## 2023-09-04 NOTE — Progress Notes (Signed)
Hi Raymond Mitchell, kidney function looks back down to baseline and liver enzymes are normal which is great. No excess protein in the urine.

## 2023-10-11 ENCOUNTER — Other Ambulatory Visit: Payer: Self-pay | Admitting: Family Medicine

## 2023-10-22 ENCOUNTER — Other Ambulatory Visit: Payer: Self-pay | Admitting: Family Medicine

## 2023-10-22 DIAGNOSIS — I951 Orthostatic hypotension: Secondary | ICD-10-CM

## 2023-11-06 ENCOUNTER — Other Ambulatory Visit: Payer: Self-pay | Admitting: Family Medicine

## 2023-11-06 DIAGNOSIS — M5416 Radiculopathy, lumbar region: Secondary | ICD-10-CM

## 2024-01-07 ENCOUNTER — Other Ambulatory Visit: Payer: Self-pay | Admitting: Family Medicine

## 2024-01-07 DIAGNOSIS — E785 Hyperlipidemia, unspecified: Secondary | ICD-10-CM

## 2024-03-03 ENCOUNTER — Ambulatory Visit: Payer: Medicare Other | Admitting: Family Medicine

## 2024-03-10 ENCOUNTER — Encounter: Payer: Self-pay | Admitting: Family Medicine

## 2024-03-10 ENCOUNTER — Ambulatory Visit (INDEPENDENT_AMBULATORY_CARE_PROVIDER_SITE_OTHER): Payer: Medicare Other | Admitting: Family Medicine

## 2024-03-10 VITALS — BP 119/75 | HR 76 | Ht 73.0 in | Wt 196.0 lb

## 2024-03-10 DIAGNOSIS — I10 Essential (primary) hypertension: Secondary | ICD-10-CM | POA: Diagnosis not present

## 2024-03-10 DIAGNOSIS — K5909 Other constipation: Secondary | ICD-10-CM

## 2024-03-10 DIAGNOSIS — N400 Enlarged prostate without lower urinary tract symptoms: Secondary | ICD-10-CM

## 2024-03-10 DIAGNOSIS — R7301 Impaired fasting glucose: Secondary | ICD-10-CM

## 2024-03-10 DIAGNOSIS — N1831 Chronic kidney disease, stage 3a: Secondary | ICD-10-CM

## 2024-03-10 DIAGNOSIS — E785 Hyperlipidemia, unspecified: Secondary | ICD-10-CM

## 2024-03-10 DIAGNOSIS — Q612 Polycystic kidney, adult type: Secondary | ICD-10-CM

## 2024-03-10 DIAGNOSIS — K760 Fatty (change of) liver, not elsewhere classified: Secondary | ICD-10-CM | POA: Diagnosis not present

## 2024-03-10 NOTE — Assessment & Plan Note (Signed)
Due to recheck lipids. 

## 2024-03-10 NOTE — Assessment & Plan Note (Signed)
 Takes 2 senna a day to keep he bowels moving.

## 2024-03-10 NOTE — Assessment & Plan Note (Addendum)
 Also follows up with [facility].  Recheck renal function as well as urine microalbumin today.

## 2024-03-10 NOTE — Assessment & Plan Note (Signed)
 Will check PSA.  He is doing pretty well with the Flomax .  He says at night he usually does not get up occasionally if he has a glass of wine with cooking dinner he might get up once to urinate.  He has noticed that pattern.

## 2024-03-10 NOTE — Progress Notes (Signed)
 Established Patient Office Visit  Subjective  Patient ID: Raymond Mitchell, male    DOB: 21-Jul-1956  Age: 68 y.o. MRN: [id number]  Chief Complaint  Patient presents with   ifg    HPI  Hypertension- Pt denies chest pain, SOB, dizziness, or heart palpitations.  Taking meds as directed w/o problems.  Denies medication side effects.    Impaired fasting glucose-no increased thirst or urination. No symptoms consistent with hypoglycemia.  He states he has had a flare with his neck recently but it has been getting better over the weekend he has been trying to do the stretches etc.  He is actually getting an injection a nerve ablation in his lumbar spine scheduled for next week he is on chronic hydrocodone .  He wants to be able to get back to golfing.  No recent chest pain or shortness of breath.     ROS    Objective:     BP 119/75   Pulse 76   Ht 6\' 1"  (1.854 m)   Wt 196 lb 0.6 oz (88.9 kg)   SpO2 94%   BMI 25.86 kg/m    Physical Exam Vitals and nursing note reviewed.  Constitutional:      Appearance: Normal appearance.  HENT:     Head: Normocephalic and atraumatic.  Eyes:     Conjunctiva/sclera: Conjunctivae normal.  Cardiovascular:     Rate and Rhythm: Normal rate and regular rhythm.  Pulmonary:     Effort: Pulmonary effort is normal.     Breath sounds: Normal breath sounds.  Skin:    General: Skin is warm and dry.  Neurological:     Mental Status: He is alert.  Psychiatric:        Mood and Affect: Mood normal.     No results found for any visits on 03/10/24.    The 10-year ASCVD risk score (Arnett DK, et al., 2019) is: 12.5%    Assessment & Plan:   Problem List Items Addressed This Visit       Cardiovascular and Mediastinum   Essential hypertension, benign - Primary   Well controlled. Continue current regimen. Follow up in  6 mo       Relevant Orders   CMP14+EGFR   Lipid panel   Hemoglobin A1c   Urine Microalbumin w/creat. ratio      Digestive   Fatty liver     Endocrine   IFG (impaired fasting glucose)   Due for A1C.        Relevant Orders   CMP14+EGFR   Lipid panel   Hemoglobin A1c   Urine Microalbumin w/creat. ratio     Genitourinary   Stage 3a chronic kidney disease (HCC)   Also follows up with [facility].  Recheck renal function as well as urine microalbumin today.      Relevant Orders   CMP14+EGFR   Lipid panel   Hemoglobin A1c   Urine Microalbumin w/creat. ratio   BPH (benign prostatic hyperplasia)   Will check PSA.  He is doing pretty well with the Flomax .  He says at night he usually does not get up occasionally if he has a glass of wine with cooking dinner he might get up once to urinate.  He has noticed that pattern.      Relevant Orders   PSA   ADPKD (autosomal dominant polycystic kidney disease)   Follows with Dr. Verda Gist yearly         Other   Hyperlipidemia  Due to recheck lipids.       CONSTIPATION, DRUG INDUCED   Takes 2 senna a day to keep he bowels moving.         Shingles vaccine - Due for now that he has Medicare who have to get this through his pharmacy benefit as it will not be covered under his medical so encouraged him to schedule that at his local pharmacy so that he can complete the vaccine series.  Return in about 6 months (around 09/09/2024) for Hypertension, CKD 3.    Duaine German, MD

## 2024-03-10 NOTE — Assessment & Plan Note (Signed)
 Follows with Dr. Verda Gist yearly

## 2024-03-10 NOTE — Assessment & Plan Note (Signed)
 Well controlled. Continue current regimen. Follow up in  6 mo

## 2024-03-10 NOTE — Assessment & Plan Note (Signed)
Due for A1C  

## 2024-03-11 ENCOUNTER — Encounter: Payer: Self-pay | Admitting: Family Medicine

## 2024-03-11 ENCOUNTER — Ambulatory Visit: Payer: Self-pay | Admitting: Family Medicine

## 2024-03-11 LAB — MICROALBUMIN / CREATININE URINE RATIO
Creatinine, Urine: 161.2 mg/dL
Microalb/Creat Ratio: 6 mg/g{creat} (ref 0–29)
Microalbumin, Urine: 9.3 ug/mL

## 2024-03-11 LAB — CMP14+EGFR
ALT: 20 IU/L (ref 0–44)
AST: 21 IU/L (ref 0–40)
Albumin: 4 g/dL (ref 3.9–4.9)
Alkaline Phosphatase: 98 IU/L (ref 44–121)
BUN/Creatinine Ratio: 15 (ref 10–24)
BUN: 17 mg/dL (ref 8–27)
Bilirubin Total: 0.4 mg/dL (ref 0.0–1.2)
CO2: 23 mmol/L (ref 20–29)
Calcium: 9.4 mg/dL (ref 8.6–10.2)
Chloride: 104 mmol/L (ref 96–106)
Creatinine, Ser: 1.15 mg/dL (ref 0.76–1.27)
Globulin, Total: 2.4 g/dL (ref 1.5–4.5)
Glucose: 96 mg/dL (ref 70–99)
Potassium: 4.7 mmol/L (ref 3.5–5.2)
Sodium: 143 mmol/L (ref 134–144)
Total Protein: 6.4 g/dL (ref 6.0–8.5)
eGFR: 70 mL/min/{1.73_m2} (ref >59)

## 2024-03-11 LAB — HEMOGLOBIN A1C
Est. average glucose Bld gHb Est-mCnc: 114 mg/dL
Hgb A1c MFr Bld: 5.6 % (ref 4.8–5.6)

## 2024-03-11 LAB — LIPID PANEL
Chol/HDL Ratio: 3.2 ratio (ref 0.0–5.0)
Cholesterol, Total: 129 mg/dL (ref 100–199)
HDL: 40 mg/dL (ref >39)
LDL Chol Calc (NIH): 73 mg/dL (ref 0–99)
Triglycerides: 78 mg/dL (ref 0–149)
VLDL Cholesterol Cal: 16 mg/dL (ref 5–40)

## 2024-03-11 LAB — PSA: Prostate Specific Ag, Serum: 1.2 ng/mL (ref 0.0–4.0)

## 2024-03-11 NOTE — Progress Notes (Signed)
 Cindie Credit, metabolic panel looks great.  LDL cholesterol 73 goal is less than 70 so you are really quite close continue to work on healthy diet and regular exercise.  Your A1c looks great this time at 5.6 you brought that down compared to 6 months ago and it was 5.9 so fantastic work.  Because you are a former smoker you are also a candidate for lung cancer screening yearly it is a low-dose CAT scan if that something that you might be interested in doing please let me know and I will get that coordinated

## 2024-03-12 NOTE — Progress Notes (Signed)
 Your lab work is within acceptable range and there are no concerning findings.   ?

## 2024-03-14 ENCOUNTER — Other Ambulatory Visit: Payer: Self-pay | Admitting: Family Medicine

## 2024-03-14 DIAGNOSIS — Z87891 Personal history of nicotine dependence: Secondary | ICD-10-CM

## 2024-03-14 NOTE — Progress Notes (Signed)
 Orders Placed This Encounter  Procedures   Ambulatory Referral Lung Cancer Screening [facility]    Referral Priority:   Routine    Referral Type:   Consultation    Referral Reason:   Specialty Services Required    Number of Visits Requested:   1

## 2024-03-14 NOTE — Progress Notes (Signed)
 Order placed. Will receive call from [employer]

## 2024-04-17 ENCOUNTER — Other Ambulatory Visit: Payer: Self-pay | Admitting: Family Medicine

## 2024-04-17 DIAGNOSIS — M5416 Radiculopathy, lumbar region: Secondary | ICD-10-CM

## 2024-04-29 ENCOUNTER — Other Ambulatory Visit: Payer: Self-pay | Admitting: *Deleted

## 2024-04-29 ENCOUNTER — Telehealth: Payer: Self-pay

## 2024-04-29 ENCOUNTER — Telehealth: Payer: Self-pay | Admitting: *Deleted

## 2024-04-29 DIAGNOSIS — F17210 Nicotine dependence, cigarettes, uncomplicated: Secondary | ICD-10-CM

## 2024-04-29 DIAGNOSIS — Z87891 Personal history of nicotine dependence: Secondary | ICD-10-CM

## 2024-04-29 DIAGNOSIS — Z122 Encounter for screening for malignant neoplasm of respiratory organs: Secondary | ICD-10-CM

## 2024-04-29 NOTE — Telephone Encounter (Signed)
 Lung Cancer Screening Narrative/Criteria Questionnaire (Cigarette Smokers Only- No Cigars/Pipes/vapes)   Raymond Mitchell   SDMV:05/07/24 1:30- [name]                                           [birth year]              LDCT: 05/08/24 7:30- GI    68 y.o.   Phone: [phone]  Lung Screening Narrative (confirm age 53-77 yrs Medicare / 50-80 yrs Private pay insurance)   Insurance information:BCBS   Referring Provider:[facility]   This screening involves an initial phone call with a team member from our program. It is called a shared decision making visit. The initial meeting is required by insurance and Medicare to make sure you understand the program. This appointment takes about 15-20 minutes to complete. The CT scan will completed at a separate date/time. This scan takes about 5-10 minutes to complete and you may eat and drink before and after the scan.  Criteria questions for Lung Cancer Screening:   Are you a current or former smoker? Current Age began smoking: 41   If you are a former smoker, what year did you quit smoking? (within 15 yrs)   To calculate your smoking history, I need an accurate estimate of how many packs of cigarettes you smoked per day and for how many years. (Not just the number of PPD you are now smoking)   Years smoking 51 x Packs per day 1 = Pack years 51   (at least 20 pack yrs)   (Make sure they understand that we need to know how much they have smoked in the past, not just the number of PPD they are smoking now)  Do you have a personal history of cancer?  No    Do you have a family history of cancer? Yes  (cancer type and and relative) Brother (brain)  Are you coughing up blood?  No  Have you had unexplained weight loss of 15 lbs or more in the last 6 months? No  It looks like you meet all criteria.     Additional information:

## 2024-04-29 NOTE — Telephone Encounter (Signed)
 Raymond Dorothyann BIRCH, MD  [name], CMA Please call patient and see if we are able to get in touch with him since the lung cancer screening program has not been able to get in touch with him.

## 2024-04-29 NOTE — Telephone Encounter (Signed)
 I spoke with patient and he will try to call them today. He states he has called multiple times and was put on hold for long wait times.

## 2024-05-04 ENCOUNTER — Other Ambulatory Visit: Payer: Self-pay | Admitting: Family Medicine

## 2024-05-07 ENCOUNTER — Ambulatory Visit: Admitting: *Deleted

## 2024-05-07 ENCOUNTER — Encounter: Payer: Self-pay | Admitting: *Deleted

## 2024-05-07 DIAGNOSIS — F17210 Nicotine dependence, cigarettes, uncomplicated: Secondary | ICD-10-CM

## 2024-05-07 NOTE — Progress Notes (Signed)
   Virtual Visit via Telephone Note  I connected with Raymond Mitchell on 05/07/24 at  1:30 PM EDT by telephone and verified that I am speaking with the correct person using two identifiers.  Location: Patient: in home Provider: [address], [place], [place], [address] Shared Decision Making Visit Lung Cancer Screening Program [phone])   Eligibility: Age 68 y.o. Pack Years Smoking History Calculation 51 (# packs/per year x # years smoked) Recent History of coughing up blood  no Unexplained weight loss? no ( >Than 15 pounds within the last 6 months ) Prior History Lung / other cancer no (Diagnosis within the last 5 years already requiring surveillance chest CT Scans). Smoking Status Current Smoker Former Smokers: Years since quit:  NA  Quit Date: NA  Visit Components: Discussion included one or more decision making aids. yes Discussion included risk/benefits of screening. yes Discussion included potential follow up diagnostic testing for abnormal scans. yes Discussion included meaning and risk of over diagnosis. yes Discussion included meaning and risk of False Positives. yes Discussion included meaning of total radiation exposure. yes  Counseling Included: Importance of adherence to annual lung cancer LDCT screening. yes Impact of comorbidities on ability to participate in the program. yes Ability and willingness to under diagnostic treatment. yes  Smoking Cessation Counseling: Current Smokers:  Discussed importance of smoking cessation. yes Information about tobacco cessation classes and interventions provided to patient. yes Patient provided with ticket for LDCT Scan. yes Symptomatic Patient. no  Counseling NA Diagnosis Code: Tobacco Use Z72.0 Asymptomatic Patient yes  Counseling (Intermediate counseling: > three minutes counseling) H9563 Former Smokers:  Discussed the importance of maintaining cigarette abstinence. yes Diagnosis Code: Personal History  of Nicotine Dependence. S12.108 Information about tobacco cessation classes and interventions provided to patient. Yes Patient provided with ticket for LDCT Scan. yes Written Order for Lung Cancer Screening with LDCT placed in Epic. Yes (CT Chest Lung Cancer Screening Low Dose W/O CM) [id number] Z12.2-Screening of respiratory organs Z87.891-Personal history of nicotine dependence   Josette Ranger, RN 05/07/24

## 2024-05-07 NOTE — Patient Instructions (Signed)

## 2024-05-08 ENCOUNTER — Inpatient Hospital Stay
Admission: RE | Admit: 2024-05-08 | Discharge: 2024-05-08 | Disposition: A | Discharge status: Home / Self Care | Source: Ambulatory Visit | Attending: Acute Care | Admitting: Acute Care

## 2024-05-08 DIAGNOSIS — F17210 Nicotine dependence, cigarettes, uncomplicated: Secondary | ICD-10-CM

## 2024-05-08 DIAGNOSIS — Z87891 Personal history of nicotine dependence: Secondary | ICD-10-CM

## 2024-05-08 DIAGNOSIS — Z122 Encounter for screening for malignant neoplasm of respiratory organs: Secondary | ICD-10-CM

## 2024-05-14 NOTE — Progress Notes (Addendum)
 Counseled patient 4 minutes regarding tobacco use.

## 2024-05-21 ENCOUNTER — Telehealth: Payer: Self-pay

## 2024-05-21 ENCOUNTER — Telehealth: Payer: Self-pay | Admitting: Family Medicine

## 2024-05-21 ENCOUNTER — Other Ambulatory Visit: Payer: Self-pay

## 2024-05-21 ENCOUNTER — Encounter: Payer: Self-pay | Admitting: Family Medicine

## 2024-05-21 DIAGNOSIS — Z87891 Personal history of nicotine dependence: Secondary | ICD-10-CM

## 2024-05-21 DIAGNOSIS — K7689 Other specified diseases of liver: Secondary | ICD-10-CM

## 2024-05-21 DIAGNOSIS — J439 Emphysema, unspecified: Secondary | ICD-10-CM | POA: Insufficient documentation

## 2024-05-21 DIAGNOSIS — I2510 Atherosclerotic heart disease of native coronary artery without angina pectoris: Secondary | ICD-10-CM | POA: Insufficient documentation

## 2024-05-21 DIAGNOSIS — F17210 Nicotine dependence, cigarettes, uncomplicated: Secondary | ICD-10-CM

## 2024-05-21 DIAGNOSIS — Z122 Encounter for screening for malignant neoplasm of respiratory organs: Secondary | ICD-10-CM

## 2024-05-21 DIAGNOSIS — I700 Atherosclerosis of aorta: Secondary | ICD-10-CM | POA: Insufficient documentation

## 2024-05-21 NOTE — Telephone Encounter (Signed)
 Please call patient and let him know that we were forwarded his CT results that he had for his lung cancer screening.  They did note a couple of things unrelated 1 is that he does have some plaque formation in the aorta and the coronaries so just encouraged him to continue to work on healthy Mediterranean diet, regular exercise and continue taking his atorvastatin  40 mg to reduce risk for heart attack or stroke.  They also noted some emphysema and really like to get him scheduled for spirometry here in our office which is a breathing test am not sure if he has had any type of breathing evaluation or lung evaluation anywhere else?  Also they noted that he has some enlarging liver cysts.  These were originally noted back in 2016 but have gotten larger since then.  So I would like to refer him to GI for further workup and follow-up to see if this needs to be again either followed or worked up further.  If he has a preference for GI provider or location please let me know.

## 2024-05-21 NOTE — Telephone Encounter (Signed)
 Called and spoke with patient. Reviewed results below. He is on statin therapy. He is aware of the cyst on his liver and kidneys. Advises he sees a kidney specialist yearly. No questions. Pt will complete an annual Lung CT again next year. Order placed. Results and plan to PCP.   IMPRESSION: 1. Lung-RADS 2, benign appearance or behavior. Continue annual screening with low-dose chest CT without contrast in 12 months. 2. Aortic Atherosclerosis (ICD10-I70.0) and Emphysema (ICD10-J43.9). Coronary artery atherosclerosis. 3. Enlargement of bilateral hepatic cysts. Although this could simply represent idiopathic cysts, cannot exclude developing polycystic liver disease.

## 2024-05-21 NOTE — Telephone Encounter (Signed)
-----   Message from Nurse Isaiah SQUIBB sent at 05/21/2024 12:50 PM EDT ----- Hi Dr. Kathyrn have been reviewed with the patient. She will complete her annual Lung CT again next year. IMPRESSION: Additional Findings: Aortic Atherosclerosis, Emphysema, Coronary artery atherosclerosis and Enlargement of bilateral hepatic cysts. Although this could simply represent idiopathic cysts, cannot exclude developing polycystic liver disease.

## 2024-05-22 NOTE — Telephone Encounter (Signed)
 05/22/2024-Left message on patients voicemail to contact the office to schedule spirometry test.

## 2024-05-22 NOTE — Telephone Encounter (Signed)
 Spoke with pt he has never had a breathing test. (FWD to [facility] to schedule for spirometry) He is ok with GI referral. Will place referral for [facility].

## 2024-05-27 ENCOUNTER — Other Ambulatory Visit: Payer: Self-pay | Admitting: Family Medicine

## 2024-06-03 ENCOUNTER — Encounter: Payer: Self-pay | Admitting: Sports Medicine

## 2024-06-14 ENCOUNTER — Other Ambulatory Visit: Payer: Self-pay | Admitting: Family Medicine

## 2024-06-14 DIAGNOSIS — E785 Hyperlipidemia, unspecified: Secondary | ICD-10-CM

## 2024-06-26 ENCOUNTER — Other Ambulatory Visit: Payer: Self-pay | Admitting: Family Medicine

## 2024-06-26 DIAGNOSIS — I951 Orthostatic hypotension: Secondary | ICD-10-CM

## 2024-08-13 ENCOUNTER — Ambulatory Visit

## 2024-08-15 ENCOUNTER — Other Ambulatory Visit: Payer: Self-pay | Admitting: Family Medicine

## 2024-08-15 DIAGNOSIS — M5416 Radiculopathy, lumbar region: Secondary | ICD-10-CM

## 2024-09-02 ENCOUNTER — Ambulatory Visit

## 2024-09-09 ENCOUNTER — Ambulatory Visit: Admitting: Family Medicine

## 2024-09-09 ENCOUNTER — Encounter: Payer: Self-pay | Admitting: Family Medicine

## 2024-09-09 VITALS — BP 132/82 | HR 67 | Ht 73.0 in | Wt 196.0 lb

## 2024-09-09 DIAGNOSIS — N1831 Chronic kidney disease, stage 3a: Secondary | ICD-10-CM

## 2024-09-09 DIAGNOSIS — R7301 Impaired fasting glucose: Secondary | ICD-10-CM

## 2024-09-09 DIAGNOSIS — E785 Hyperlipidemia, unspecified: Secondary | ICD-10-CM

## 2024-09-09 DIAGNOSIS — J439 Emphysema, unspecified: Secondary | ICD-10-CM

## 2024-09-09 DIAGNOSIS — M459 Ankylosing spondylitis of unspecified sites in spine: Secondary | ICD-10-CM

## 2024-09-09 DIAGNOSIS — I10 Essential (primary) hypertension: Secondary | ICD-10-CM

## 2024-09-09 DIAGNOSIS — Z23 Encounter for immunization: Secondary | ICD-10-CM

## 2024-09-09 NOTE — Assessment & Plan Note (Signed)
 A1C stable and looks great today.  Continue to work on healthy diet/.

## 2024-09-09 NOTE — Assessment & Plan Note (Signed)
Monitor Q 6 mo

## 2024-09-09 NOTE — Progress Notes (Signed)
 Established Patient Office Visit  Patient ID: Raymond Mitchell, male    DOB: May 31, 1956  Age: 68 y.o. MRN: [id number] PCP: Alvan Dorothyann BIRCH, MD  Chief Complaint  Patient presents with   ifg   Hypertension    Subjective:     HPI  Discussed the use of AI scribe software for clinical note transcription with the patient, who gave verbal consent to proceed.  History of Present Illness Raymond Mitchell is a 68 year old male with emphysema who presents with a persistent head cold.  Upper respiratory symptoms - Persistent head cold for the past couple of weeks without improvement - Similar episode occurred in August or September - Congestion with mild cough, attributed to upper airway symptoms - No recent wheezing or significant cough - Increased use of Flonase nasal spray for allergies during current illness - NyQuil used at night and during the day over a weekend when off work  Pulmonary history - Emphysema identified during lung cancer screenings - No inhaler use and does not feel an inhaler is needed  Exercise tolerance and fatigue - Active lifestyle with regular treadmill and Bowflex exercise - Initial fatigue at start of exercise, able to increase pace after a quarter mile - Caffeine-containing energy drink consumed during workouts and throughout the day, sipped over several hours - Heart rate during exercise remains around 95 to 105 bpm - No sleep disturbances despite caffeine intake  Immunization status - Received influenza vaccination  Musculoskeletal pain - Chronic back pain managed with hydrocodone  - Abrasion procedure performed in past summer provided temporary relief - Unable to take anti-inflammatory medications due to colitis  Gastrointestinal history - History of colitis - Scheduled for digestive consultation in January - Colonoscopy scheduled for March     ROS    Objective:     BP 132/82   Pulse 67   Ht 6' 1 (1.854 m)    Wt 196 lb 0.6 oz (88.9 kg)   SpO2 97%   BMI 25.86 kg/m    Physical Exam Vitals and nursing note reviewed.  Constitutional:      Appearance: Normal appearance.  HENT:     Head: Normocephalic and atraumatic.     Right Ear: Tympanic membrane, ear canal and external ear normal. There is no impacted cerumen.     Left Ear: Tympanic membrane, ear canal and external ear normal. There is no impacted cerumen.     Nose: Nose normal.     Mouth/Throat:     Pharynx: Oropharynx is clear.  Eyes:     Conjunctiva/sclera: Conjunctivae normal.  Cardiovascular:     Rate and Rhythm: Normal rate and regular rhythm.  Pulmonary:     Effort: Pulmonary effort is normal.     Breath sounds: Normal breath sounds.  Musculoskeletal:     Cervical back: Neck supple. No tenderness.  Lymphadenopathy:     Cervical: No cervical adenopathy.  Skin:    General: Skin is warm and dry.  Neurological:     Mental Status: He is alert and oriented to person, place, and time.  Psychiatric:        Mood and Affect: Mood normal.      No results found for any visits on 09/09/24.    The ASCVD Risk score (Arnett DK, et al., 2019) failed to calculate for the following reasons:   The valid total cholesterol range is 130 to 320 mg/dL    Assessment & Plan:   Problem List Items Addressed This  Visit       Cardiovascular and Mediastinum   Essential hypertension, benign    Essential hypertension Blood pressure well-controlled. Discussed caffeine effects. - Continue current management.      Relevant Orders   CMP14+EGFR   HgB A1c     Respiratory   Emphysema lung (HCC)   Monitored yearly for lung cancer screening.  - Consider albuterol  inhaler for increased symptoms. Discussed risk of exacerbation from infections.        Endocrine   IFG (impaired fasting glucose) - Primary   A1C stable and looks great today.  Continue to work on healthy diet/.       Relevant Orders   CMP14+EGFR   HgB A1c      Musculoskeletal and Integument   ANKYLOSING SPONDYLITIS   Not on DMARD tx, on opioids.         Genitourinary   Chronic kidney disease (CKD) stage G3a/A1, moderately decreased glomerular filtration rate (GFR) between 45-59 mL/min/1.73 square meter and albuminuria creatinine ratio less than 30 mg/g (HCC)   Monitor Q 6 mo       Relevant Orders   CMP14+EGFR   HgB A1c   Other Visit Diagnoses       Encounter for immunization       Relevant Orders   Flu vaccine HIGH DOSE PF(Fluzone Trivalent) (Completed)     Encounter for immunization       Relevant Orders   Pfizer Comirnaty Covid-19 Vaccine 48yrs & older (Completed)       Assessment and Plan Assessment & Plan Upper respiratory infection Mild viral infection with improving symptoms. - Continue Flonase nasal spray. - Monitor symptoms, report if not improving in a week.  Chronic pain Managed with hydrocodone  and codeine. No changes in regimen. - Continue current pain management.  Colitis Limits use of anti-inflammatory medications due to GI side effects. - Avoid routine use of anti-inflammatory medications.  General health maintenance Discussed vaccinations and screenings. Received flu shot. - Administered COVID vaccine. - Encouraged shingles vaccine at pharmacy. - Performed blood work.    Return in about 6 months (around 03/10/2025) for Hypertension.    Dorothyann Byars, MD [facility] at [facility]

## 2024-09-09 NOTE — Assessment & Plan Note (Signed)
 Not on DMARD tx, on opioids.

## 2024-09-09 NOTE — Assessment & Plan Note (Addendum)
 Monitored yearly for lung cancer screening.  - Consider albuterol  inhaler for increased symptoms. Discussed risk of exacerbation from infections.

## 2024-09-09 NOTE — Assessment & Plan Note (Signed)
  Essential hypertension Blood pressure well-controlled. Discussed caffeine effects. - Continue current management.

## 2024-09-10 ENCOUNTER — Ambulatory Visit: Payer: Self-pay | Admitting: Family Medicine

## 2024-09-10 LAB — CMP14+EGFR
ALT: 22 IU/L (ref 0–44)
AST: 19 IU/L (ref 0–40)
Albumin: 4 g/dL (ref 3.9–4.9)
Alkaline Phosphatase: 93 IU/L (ref 47–123)
BUN/Creatinine Ratio: 17 (ref 10–24)
BUN: 19 mg/dL (ref 8–27)
Bilirubin Total: 0.2 mg/dL (ref 0.0–1.2)
CO2: 24 mmol/L (ref 20–29)
Calcium: 9 mg/dL (ref 8.6–10.2)
Chloride: 103 mmol/L (ref 96–106)
Creatinine, Ser: 1.12 mg/dL (ref 0.76–1.27)
Globulin, Total: 2.2 g/dL (ref 1.5–4.5)
Glucose: 102 mg/dL — ABNORMAL HIGH (ref 70–99)
Potassium: 4.1 mmol/L (ref 3.5–5.2)
Sodium: 139 mmol/L (ref 134–144)
Total Protein: 6.2 g/dL (ref 6.0–8.5)
eGFR: 72 mL/min/1.73 (ref >59)

## 2024-09-10 LAB — HEMOGLOBIN A1C
Est. average glucose Bld gHb Est-mCnc: 120 mg/dL
Hgb A1c MFr Bld: 5.8 % — ABNORMAL HIGH (ref 4.8–5.6)

## 2024-09-10 NOTE — Progress Notes (Signed)
 HI Marsha, your metabolic panel is OK. A1C is stable at 5.8.

## 2024-09-14 ENCOUNTER — Other Ambulatory Visit: Payer: Self-pay | Admitting: Family Medicine

## 2024-09-14 DIAGNOSIS — E785 Hyperlipidemia, unspecified: Secondary | ICD-10-CM

## 2024-10-07 ENCOUNTER — Ambulatory Visit

## 2024-10-07 VITALS — BP 114/72 | HR 77 | Ht 73.0 in | Wt 196.0 lb

## 2024-10-07 DIAGNOSIS — Z0000 Encounter for general adult medical examination without abnormal findings: Secondary | ICD-10-CM | POA: Diagnosis not present

## 2024-10-07 NOTE — Patient Instructions (Signed)
 Raymond Mitchell,  Thank you for taking the time for your Medicare Wellness Visit. I appreciate your continued commitment to your health goals. Please review the care plan we discussed, and feel free to reach out if I can assist you further.  Please note that Annual Wellness Visits do not include a physical exam. Some assessments may be limited, especially if the visit was conducted virtually. If needed, we may recommend an in-person follow-up with your provider.  Ongoing Care Seeing your primary care provider every 3 to 6 months helps us  monitor your health and provide consistent, personalized care.   Referrals If a referral was made during today's visit and you haven't received any updates within two weeks, please contact the referred provider directly to check on the status.  Recommended Screenings:  Health Maintenance  Topic Date Due   Zoster (Shingles) Vaccine (2 of 2) 01/25/2022   Medicare Annual Wellness Visit  07/01/2024   Colon Cancer Screening  11/30/2024   COVID-19 Vaccine (7 - Pfizer risk 2025-26 season) 03/10/2025   Screening for Lung Cancer  05/08/2025   DTaP/Tdap/Td vaccine (3 - Td or Tdap) 12/01/2030   Pneumococcal Vaccine for age over 64  Completed   Flu Shot  Completed   Hepatitis C Screening  Completed   Meningitis B Vaccine  Aged Out       10/07/2024    7:56 AM  Advanced Directives  Does Patient Have a Medical Advance Directive? No  Would patient like information on creating a medical advance directive? No - Patient declined    Vision: Annual vision screenings are recommended for early detection of glaucoma, cataracts, and diabetic retinopathy. These exams can also reveal signs of chronic conditions such as diabetes and high blood pressure.  Dental: Annual dental screenings help detect early signs of oral cancer, gum disease, and other conditions linked to overall health, including heart disease and diabetes.  Please see the attached documents for additional  preventive care recommendations.

## 2024-10-07 NOTE — Progress Notes (Addendum)
 "  Chief Complaint  Patient presents with   Medicare Wellness     Subjective:   Raymond Mitchell is a 69 y.o. male who presents for a Medicare Annual Wellness Visit.  Visit info / Clinical Intake: Medicare Wellness Visit Type:: Subsequent Annual Wellness Visit Persons participating in visit and providing information:: patient Medicare Wellness Visit Mode:: In-person (required for WTM) Interpreter Needed?: No Pre-visit prep was completed: yes AWV questionnaire completed by patient prior to visit?: no Living arrangements:: lives with spouse/significant other Patient's Overall Health Status Rating: very good Typical amount of pain: some Does pain affect daily life?: no Are you currently prescribed opioids?: (!) yes  Dietary Habits and Nutritional Risks How many meals a day?: 3 Eats fruit and vegetables daily?: yes Most meals are obtained by: preparing own meals In the last 2 weeks, have you had any of the following?: none Diabetic:: no  Functional Status Activities of Daily Living (to include ambulation/medication): Independent Ambulation: Independent Medication Administration: Independent Home Management (perform basic housework or laundry): Independent Manage your own finances?: yes Primary transportation is: driving Concerns about vision?: no *vision screening is required for WTM* Concerns about hearing?: no  Fall Screening Falls in the past year?: 1 Number of falls in past year: 0 Was there an injury with Fall?: 1 Fall Risk Category Calculator: 2 Patient Fall Risk Level: Moderate Fall Risk  Fall Risk Patient at Risk for Falls Due to: No Fall Risks Fall risk Follow up: Falls evaluation completed  Home and Transportation Safety: All rugs have non-skid backing?: yes All stairs or steps have railings?: (!) no Grab bars in the bathtub or shower?: yes Have non-skid surface in bathtub or shower?: yes Good home lighting?: yes Regular seat belt use?: yes Hospital  stays in the last year:: no  Cognitive Assessment Difficulty concentrating, remembering, or making decisions? : no Will 6CIT or Mini Cog be Completed: yes What year is it?: 0 points What month is it?: 0 points Give patient an address phrase to remember (5 components): [address] [place], [place] [address] About what time is it?: 0 points Count backwards from 20 to 1: 0 points Say the months of the year in reverse: 0 points Repeat the address phrase from earlier: 0 points 6 CIT Score: 0 points  Advance Directives (For Healthcare) Does Patient Have a Medical Advance Directive?: No Would patient like information on creating a medical advance directive?: No - Patient declined  Reviewed/Updated  Reviewed/Updated: Reviewed All (Medical, Surgical, Family, Medications, Allergies, Care Teams, Patient Goals)    Allergies (verified) Nsaids   Current Medications (verified) Outpatient Encounter Medications as of 10/07/2024  Medication Sig   Ascorbic Acid (VITAMIN C WITH ROSE HIPS) 500 MG tablet Take 500 mg by mouth daily.   aspirin  EC 81 MG tablet Take 1 tablet (81 mg total) by mouth 2 (two) times daily. For DVT prophylaxis for 30 days after surgery.   atorvastatin  (LIPITOR) 40 MG tablet TAKE 1 TABLET BY MOUTH EVERY DAY   [employer] SENNA PLUS 8.6-50 MG tablet TAKE 2 TABLETS BY MOUTH AT BEDTIME   cyanocobalamin  (VITAMIN B12) 1000 MCG tablet Take 1,000 mcg by mouth daily.   fish oil-omega-3 fatty acids 1000 MG capsule Take 1 g by mouth daily.   gabapentin  (NEURONTIN ) 300 MG capsule TAKE 1 CAPSULE BY MOUTH IN THE MORNING AND 2 CAPSULES AT BEDTIME   HYDROcodone -acetaminophen  (NORCO) 10-325 MG tablet Take 1 tablet by mouth 3 (three) times daily as needed for severe pain.  ipratropium (ATROVENT ) 0.03 % nasal spray SPRAY 2 SPRAYS INTO EACH NOSTRIL IN THE MORNING   ketoconazole  (NIZORAL ) 2 % shampoo Apply 1 Application topically 3 (three) times a week.   lisinopril  (ZESTRIL ) 10 MG tablet TAKE 1 TABLET BY  MOUTH EVERY DAY   metoprolol  succinate (TOPROL -XL) 50 MG 24 hr tablet Take 1 tablet by mouth daily.   Misc. Devices MISC To whom it may concern, Raymond Mitchell is a patient here at [facility].  He takes Norco 10/325 3 times daily as needed for pain.   multivitamin (THERAGRAN) per tablet Take 1 tablet by mouth daily.   Potassium 99 MG TABS Take 1-2 tablets by mouth See admin instructions. 2 tabs in the morning, 1 tab at bedtime   tamsulosin  (FLOMAX ) 0.4 MG CAPS capsule TAKE 1 CAPSULE BY MOUTH EVERYDAY AT BEDTIME   zinc gluconate 50 MG tablet Take 50 mg by mouth daily.   [DISCONTINUED] Thiamine HCl (VITAMIN B1) 100 MG TABS Take 100 mg by mouth daily.   No facility-administered encounter medications on file as of 10/07/2024.    History: Past Medical History:  Diagnosis Date   Ankylosing spondylitis (HCC)    BPH (benign prostatic hyperplasia)    hx of   Diverticulosis    GERD (gastroesophageal reflux disease)    History of hiatal hernia    Hypertension    Lymphocytic colitis 01/15/2018   Pes planus    Right inguinal hernia    Tobacco user    Varicose veins    Past Surgical History:  Procedure Laterality Date   BACK SURGERY  12/2007   L 4 to L 5 fusion   BACK SURGERY  2012   disc repair   c spine ESI     Dr Jani   INGUINAL HERNIA REPAIR  1994   INGUINAL HERNIA REPAIR Right 07/16/2020   Procedure: LAPAROSCOPIC RIGHT INGUINAL HERNIA WITH MESH;  Surgeon: [name], Herlene Righter, MD;  Location: [facility];  Service: General;  Laterality: Right;   RTC surgery Left 2005   TOTAL HIP ARTHROPLASTY Right 02/14/2022   Procedure: TOTAL HIP ARTHROPLASTY ANTERIOR APPROACH;  Surgeon: Beverley Evalene BIRCH, MD;  Location: [facility];  Service: Orthopedics;  Laterality: Right;   Family History  Problem Relation Age of Onset   Hyperlipidemia Mother    Hypertension Mother    Diabetes Mother    Hyperlipidemia Father    Hypertension Father    Other Father        ank spondy    Other Brother        Brain tumor, CVA   Other Brother        ank spondy   Social History   Occupational History   Occupation: Building services engineer    Comment: Full time  Tobacco Use   Smoking status: Former    Current packs/day: 1.00    Average packs/day: 1 pack/day for 51.0 years (51.0 ttl pk-yrs)    Types: Cigarettes    Start date: 12/28/1971    Quit date: 12/28/2011   Smokeless tobacco: Never   Tobacco comments:    1 pack a day   Vaping Use   Vaping status: Never Used  Substance and Sexual Activity   Alcohol use: Yes    Alcohol/week: 21.0 standard drinks of alcohol    Types: 21 Standard drinks or equivalent per week    Comment: occ wine   Drug use: Never   Sexual activity: Not on file   Tobacco Counseling Counseling  given: Not Answered Tobacco comments: 1 pack a day   SDOH Screenings   Food Insecurity: No Food Insecurity (10/07/2024)  Housing: Low Risk (10/07/2024)  Transportation Needs: No Transportation Needs (10/07/2024)  Utilities: Not At Risk (10/07/2024)  Alcohol Screen: Low Risk (07/02/2023)  Depression (PHQ2-9): Low Risk (10/07/2024)  Financial Resource Strain: Low Risk (01/01/2024)   Received from [facility]  Physical Activity: Sufficiently Active (10/07/2024)  Social Connections: Socially Integrated (10/07/2024)  Stress: No Stress Concern Present (10/07/2024)  Tobacco Use: Low Risk (09/30/2024)   Received from [facility]  Recent Concern: Tobacco Use - Medium Risk (09/09/2024)  Health Literacy: Adequate Health Literacy (10/07/2024)   See flowsheets for full screening details  Depression Screen PHQ 2 & 9 Depression Scale- Over the past 2 weeks, how often have you been bothered by any of the following problems? Little interest or pleasure in doing things: 0 Feeling down, depressed, or hopeless (PHQ Adolescent also includes...irritable): 0 PHQ-2 Total Score: 0 Trouble falling or staying asleep, or sleeping too much: 0 Feeling tired or having little energy: 0 Poor  appetite or overeating (PHQ Adolescent also includes...weight loss): 0 Feeling bad about yourself - or that you are a failure or have let yourself or your family down: 0 Trouble concentrating on things, such as reading the newspaper or watching television (PHQ Adolescent also includes...like school work): 0 Moving or speaking so slowly that other people could have noticed. Or the opposite - being so fidgety or restless that you have been moving around a lot more than usual: 0 Thoughts that you would be better off dead, or of hurting yourself in some way: 0 PHQ-9 Total Score: 0     Goals Addressed             This Visit's Progress    Patient Stated       Patient states he would like to quit smoking.              Objective:    Today's Vitals   10/07/24 0747  BP: 114/72  Pulse: 77  SpO2: 95%  Weight: 196 lb (88.9 kg)  Height: 6' 1 (1.854 m)   Body mass index is 25.86 kg/m.  Hearing/Vision screen No results found. Immunizations and Health Maintenance Health Maintenance  Topic Date Due   Zoster Vaccines- Shingrix (2 of 2) 01/25/2022   Colonoscopy  11/30/2024   COVID-19 Vaccine (7 - Pfizer risk 2025-26 season) 03/10/2025   Lung Cancer Screening  05/08/2025   Medicare Annual Wellness (AWV)  10/07/2025   DTaP/Tdap/Td (3 - Td or Tdap) 12/01/2030   Pneumococcal Vaccine: 50+ Years  Completed   Influenza Vaccine  Completed   Hepatitis C Screening  Completed   Meningococcal B Vaccine  Aged Out        Assessment/Plan:  This is a routine wellness examination for Raymond Mitchell.  Patient Care Team: Alvan Dorothyann BIRCH, MD as PCP - General (Family Medicine) Niels Cordella DASEN, MD as Referring Physician (Internal Medicine)  I have personally reviewed and noted the following in the patients chart:   Medical and social history Use of alcohol, tobacco or illicit drugs  Current medications and supplements including opioid prescriptions. Functional ability and  status Nutritional status Physical activity Advanced directives List of other physicians Hospitalizations, surgeries, and ER visits in previous 12 months Vitals Screenings to include cognitive, depression, and falls Referrals and appointments  No orders of the defined types were placed in this encounter.  In addition, I have reviewed and  discussed with patient certain preventive protocols, quality metrics, and best practice recommendations. A written personalized care plan for preventive services as well as general preventive health recommendations were provided to patient.   Bonny Jon Mayor, CMA   10/07/2024   Return in 1 year (on 10/07/2025).  After Visit Summary: (In Person-Printed) AVS printed and given to the patient  Nurse Notes:   Raymond Mitchell is a 69 y.o. male patient of Metheney, Dorothyann BIRCH, MD who had a Medicare Annual Wellness Visit today. Raymond Mitchell is Working full time and lives with their spouse. He does not have any children. He reports that he is socially active and does interact with friends/family regularly. He is moderately physically active and enjoys golf, working out and doing work around [employer].    "

## 2025-03-10 ENCOUNTER — Ambulatory Visit: Admitting: Family Medicine

## 2025-10-08 ENCOUNTER — Ambulatory Visit
